# Patient Record
Sex: Female | Born: 1985 | Hispanic: No | Marital: Married | Smoking: Never smoker
Health system: Southern US, Community
[De-identification: ages and names within clinical notes are randomized; demographics above are authoritative.]

## PROBLEM LIST (undated history)

## (undated) ENCOUNTER — Inpatient Hospital Stay (HOSPITAL_COMMUNITY): Payer: Self-pay

## (undated) DIAGNOSIS — N390 Urinary tract infection, site not specified: Secondary | ICD-10-CM

## (undated) HISTORY — DX: Urinary tract infection, site not specified: N39.0

## (undated) HISTORY — PX: NO PAST SURGERIES: SHX2092

---

## 2011-01-24 ENCOUNTER — Inpatient Hospital Stay (HOSPITAL_COMMUNITY): Payer: Medicaid Other

## 2011-01-24 ENCOUNTER — Inpatient Hospital Stay (HOSPITAL_COMMUNITY)
Admission: AD | Admit: 2011-01-24 | Discharge: 2011-01-24 | Disposition: A | Discharge status: Home / Self Care | Payer: Medicaid Other | Source: Ambulatory Visit | Attending: Obstetrics & Gynecology | Admitting: Obstetrics & Gynecology

## 2011-01-24 DIAGNOSIS — O99891 Other specified diseases and conditions complicating pregnancy: Secondary | ICD-10-CM | POA: Insufficient documentation

## 2011-01-24 DIAGNOSIS — O9989 Other specified diseases and conditions complicating pregnancy, childbirth and the puerperium: Secondary | ICD-10-CM

## 2011-01-24 LAB — WET PREP, GENITAL: Clue Cells Wet Prep HPF POC: NONE SEEN

## 2011-01-27 LAB — GC/CHLAMYDIA PROBE AMP, GENITAL: Chlamydia, DNA Probe: NEGATIVE

## 2011-02-11 ENCOUNTER — Other Ambulatory Visit: Payer: Self-pay | Admitting: Obstetrics and Gynecology

## 2011-02-11 ENCOUNTER — Other Ambulatory Visit: Payer: Self-pay | Admitting: Family Medicine

## 2011-02-11 DIAGNOSIS — O0930 Supervision of pregnancy with insufficient antenatal care, unspecified trimester: Secondary | ICD-10-CM

## 2011-02-11 LAB — POCT URINALYSIS DIP (DEVICE)
Glucose, UA: NEGATIVE mg/dL
Specific Gravity, Urine: 1.02 (ref 1.005–1.030)
Urobilinogen, UA: 1 mg/dL (ref 0.0–1.0)

## 2011-02-25 ENCOUNTER — Other Ambulatory Visit: Payer: Self-pay | Admitting: Obstetrics and Gynecology

## 2011-02-25 DIAGNOSIS — O0930 Supervision of pregnancy with insufficient antenatal care, unspecified trimester: Secondary | ICD-10-CM

## 2011-02-25 LAB — POCT URINALYSIS DIP (DEVICE)
Bilirubin Urine: NEGATIVE
Glucose, UA: NEGATIVE mg/dL
Hgb urine dipstick: NEGATIVE
Nitrite: NEGATIVE

## 2011-03-04 ENCOUNTER — Other Ambulatory Visit: Payer: Self-pay | Admitting: Family Medicine

## 2011-03-04 DIAGNOSIS — O0930 Supervision of pregnancy with insufficient antenatal care, unspecified trimester: Secondary | ICD-10-CM

## 2011-03-04 DIAGNOSIS — O368190 Decreased fetal movements, unspecified trimester, not applicable or unspecified: Secondary | ICD-10-CM

## 2011-03-04 LAB — POCT URINALYSIS DIP (DEVICE)
Ketones, ur: NEGATIVE mg/dL
Leukocytes, UA: NEGATIVE

## 2011-03-11 ENCOUNTER — Other Ambulatory Visit: Payer: Self-pay | Admitting: Obstetrics and Gynecology

## 2011-03-11 DIAGNOSIS — O0930 Supervision of pregnancy with insufficient antenatal care, unspecified trimester: Secondary | ICD-10-CM

## 2011-03-11 DIAGNOSIS — O480 Post-term pregnancy: Secondary | ICD-10-CM

## 2011-03-11 LAB — POCT URINALYSIS DIP (DEVICE)
Bilirubin Urine: NEGATIVE
Glucose, UA: NEGATIVE mg/dL
Ketones, ur: NEGATIVE mg/dL
Specific Gravity, Urine: 1.02 (ref 1.005–1.030)

## 2011-03-13 ENCOUNTER — Other Ambulatory Visit: Payer: Medicaid Other

## 2011-03-13 ENCOUNTER — Ambulatory Visit (HOSPITAL_COMMUNITY)
Admission: RE | Admit: 2011-03-13 | Discharge: 2011-03-13 | Disposition: A | Discharge status: Home / Self Care | Payer: Medicaid Other | Source: Ambulatory Visit | Attending: Obstetrics and Gynecology | Admitting: Obstetrics and Gynecology

## 2011-03-13 DIAGNOSIS — O480 Post-term pregnancy: Secondary | ICD-10-CM | POA: Insufficient documentation

## 2011-03-13 DIAGNOSIS — Z3689 Encounter for other specified antenatal screening: Secondary | ICD-10-CM | POA: Insufficient documentation

## 2011-03-13 DIAGNOSIS — O0930 Supervision of pregnancy with insufficient antenatal care, unspecified trimester: Secondary | ICD-10-CM

## 2011-03-18 ENCOUNTER — Other Ambulatory Visit: Payer: Self-pay | Admitting: Obstetrics and Gynecology

## 2011-03-18 ENCOUNTER — Other Ambulatory Visit: Payer: Medicaid Other

## 2011-03-18 DIAGNOSIS — O480 Post-term pregnancy: Secondary | ICD-10-CM

## 2011-03-18 LAB — POCT URINALYSIS DIP (DEVICE)
Glucose, UA: NEGATIVE mg/dL
Specific Gravity, Urine: 1.025 (ref 1.005–1.030)

## 2011-03-20 ENCOUNTER — Inpatient Hospital Stay (HOSPITAL_COMMUNITY)
Admission: RE | Admit: 2011-03-20 | Discharge: 2011-03-23 | DRG: 775 | Disposition: A | Discharge status: Home / Self Care | Payer: Medicaid Other | Source: Ambulatory Visit | Attending: Obstetrics & Gynecology | Admitting: Obstetrics & Gynecology

## 2011-03-20 DIAGNOSIS — O480 Post-term pregnancy: Principal | ICD-10-CM | POA: Diagnosis present

## 2011-03-20 DIAGNOSIS — O99892 Other specified diseases and conditions complicating childbirth: Secondary | ICD-10-CM | POA: Diagnosis present

## 2011-03-20 DIAGNOSIS — N90810 Female genital mutilation status, unspecified: Secondary | ICD-10-CM | POA: Diagnosis present

## 2011-03-20 LAB — RPR: RPR Ser Ql: NONREACTIVE

## 2011-03-20 LAB — CBC
Hemoglobin: 12.2 g/dL (ref 12.0–15.0)
MCH: 29 pg (ref 26.0–34.0)
MCV: 87.4 fL (ref 78.0–100.0)
RBC: 4.21 MIL/uL (ref 3.87–5.11)

## 2011-03-21 DIAGNOSIS — O480 Post-term pregnancy: Secondary | ICD-10-CM

## 2011-03-21 DIAGNOSIS — O9989 Other specified diseases and conditions complicating pregnancy, childbirth and the puerperium: Secondary | ICD-10-CM

## 2011-03-22 LAB — CBC
HCT: 33.9 % — ABNORMAL LOW (ref 36.0–46.0)
Hemoglobin: 10.9 g/dL — ABNORMAL LOW (ref 12.0–15.0)
MCH: 28.2 pg (ref 26.0–34.0)
MCHC: 32.2 g/dL (ref 30.0–36.0)
RBC: 3.87 MIL/uL (ref 3.87–5.11)

## 2011-04-20 ENCOUNTER — Ambulatory Visit: Payer: Medicaid Other | Admitting: Family Medicine

## 2012-10-31 IMAGING — US US OB COMP +14 WK
1 series · 12 of 28 positions shown · non-contrast
Comparison: none

[Series 1: us ob comp +14 wk · 12 of 91 slices shown]
[im 4/91]
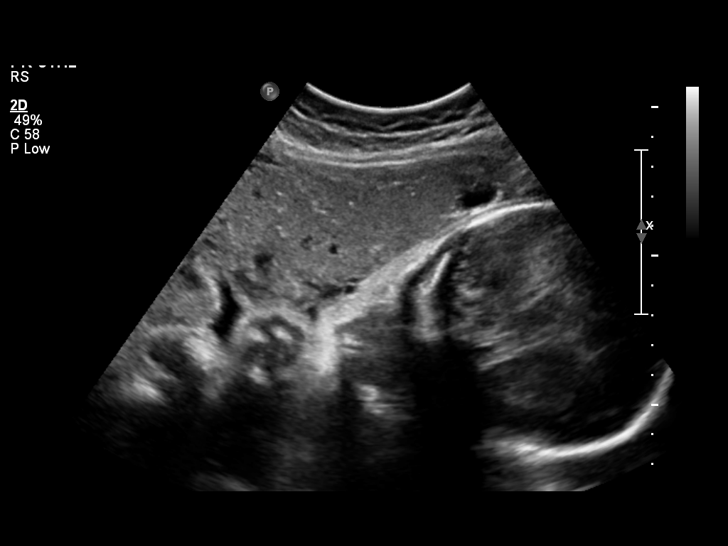
[im 11/91]
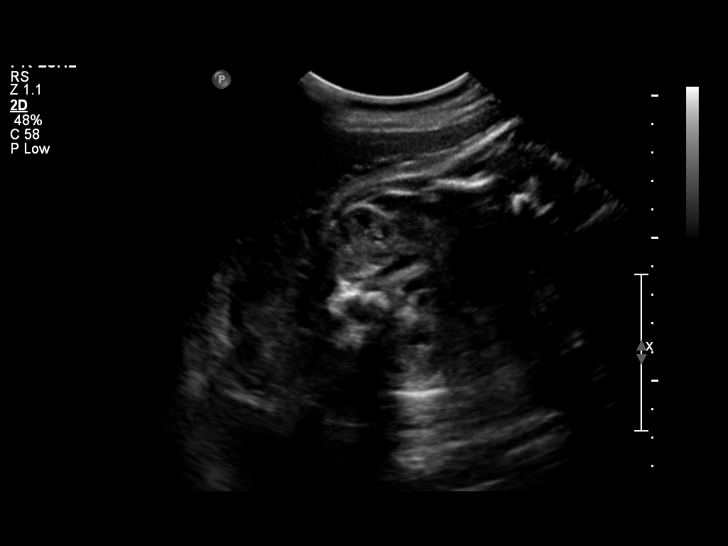
[im 17/91]
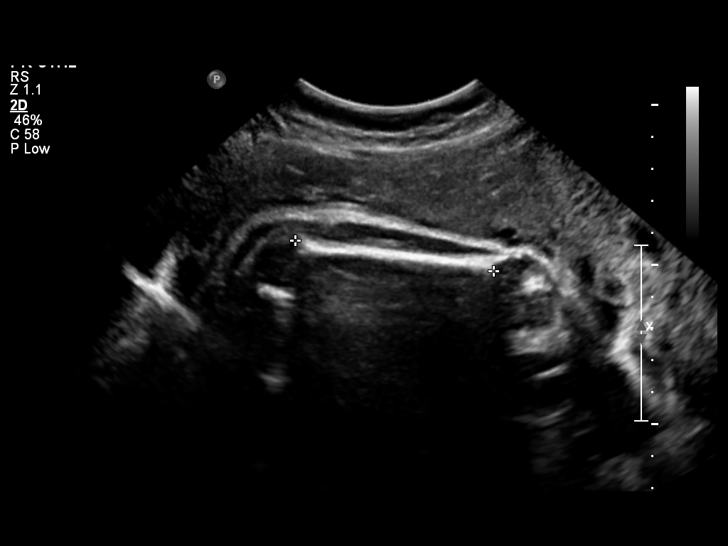
[im 27/91]
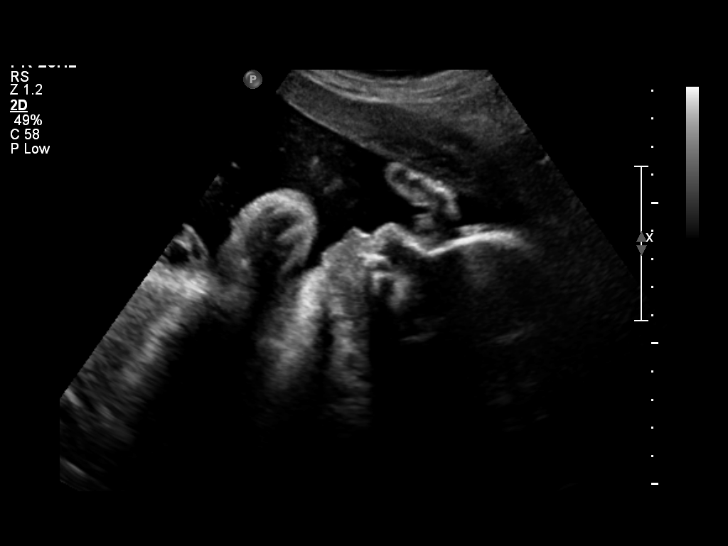
[im 34/91]
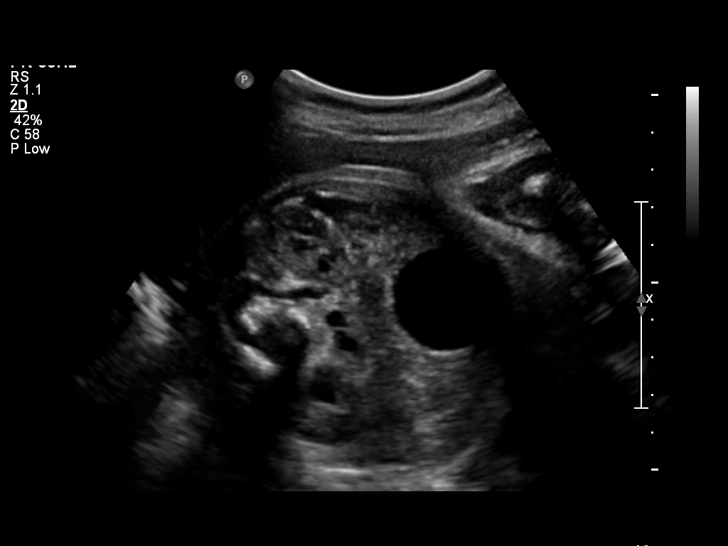
[im 41/91]
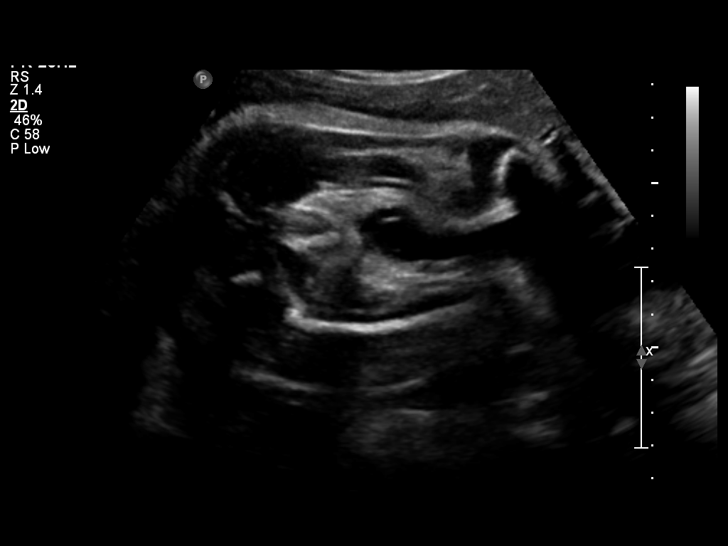
[im 51/91]
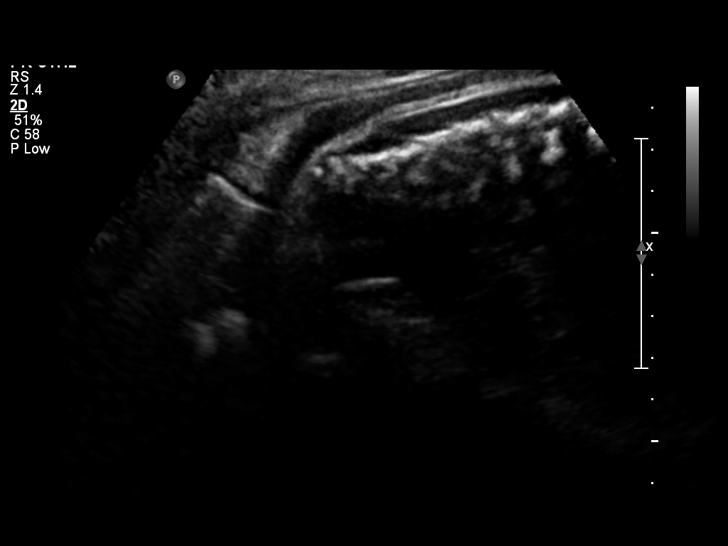
[im 57/91]
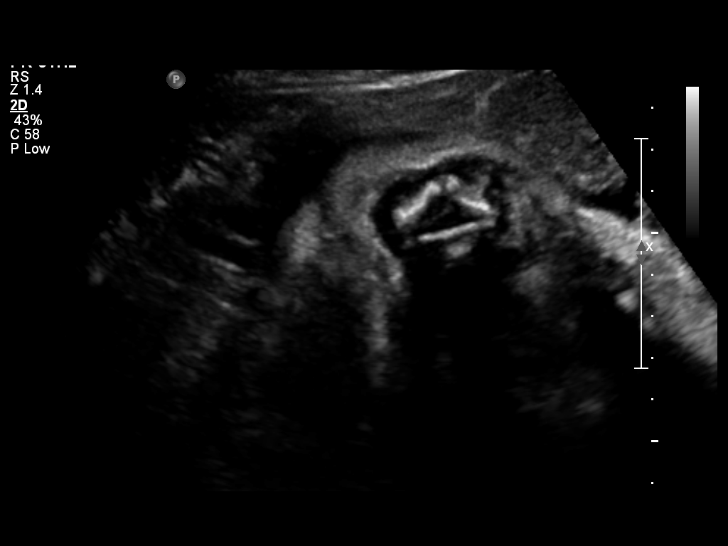
[im 64/91]
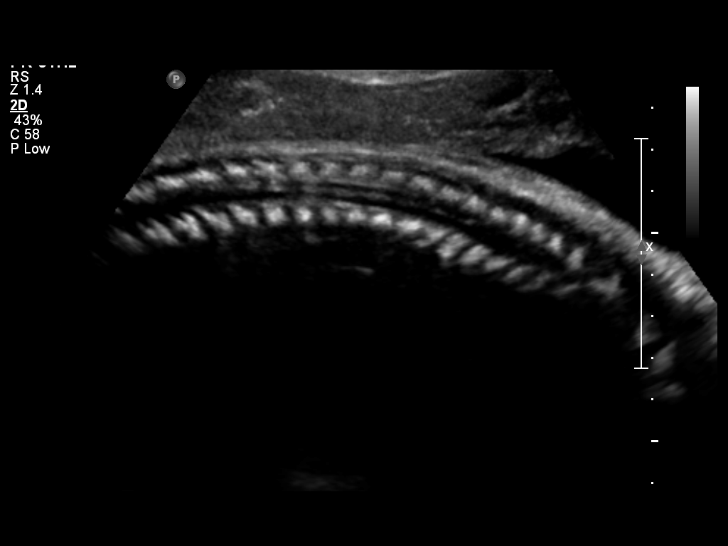
[im 74/91]
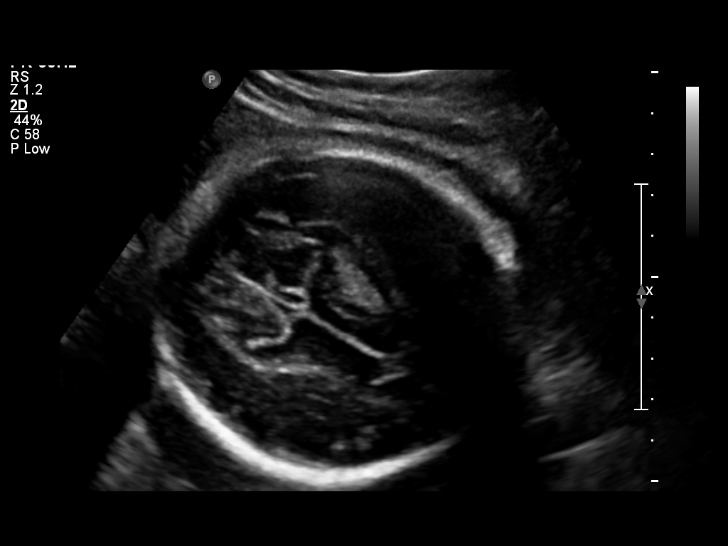
[im 81/91]
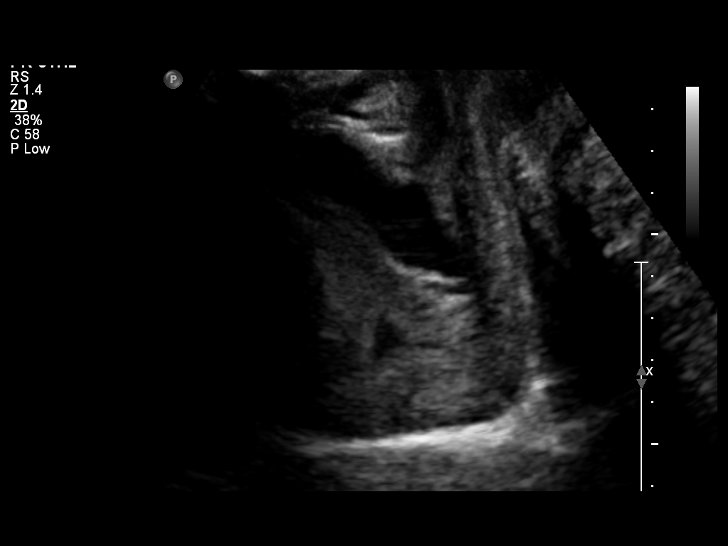
[im 87/91]
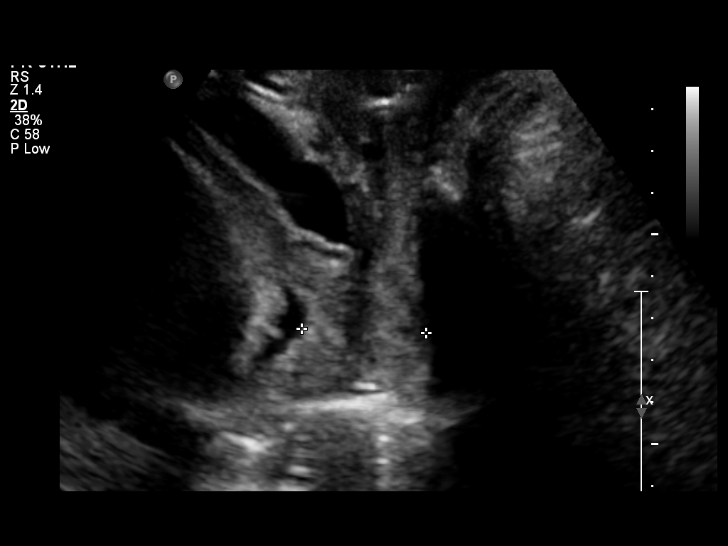

[12 of 28 positions shown; findings below may reference images not displayed]

OBSTETRICS REPORT
                      (Signed Final 01/24/2011 [DATE])

Procedures

 US OB COMP + 14 WK                                    76805.1
Indications

 Spontaneous  Rupture of Membranes - leaking fluid
 Pain - Abdominal/Pelvic
 No or Little Prenatal Care
Fetal Evaluation

 Fetal Heart Rate:  140                          bpm
 Cardiac Activity:  Observed
 Presentation:      Cephalic
 Placenta:          Anterior, above cervical os

 Amniotic Fluid
 AFI FV:      Subjectively within normal limits
 AFI Sum:     18.02   cm       66  %Tile     Larg Pckt:    6.25  cm
 RUQ:   6.25    cm   RLQ:    4.6    cm    LUQ:   3.49    cm   LLQ:    3.68   cm
Biometry

 BPD:     82.6  mm     G. Age:  33w 2d                CI:        73.68   70 - 86
                                                      FL/HC:      20.3   19.9 -

 HC:     305.7  mm     G. Age:  34w 0d       32  %    HC/AC:      1.04   0.96 -

 AC:     294.5  mm     G. Age:  33w 3d       56  %    FL/BPD:     75.3   71 - 87
 FL:      62.2  mm     G. Age:  32w 1d       16  %    FL/AC:      21.1   20 - 24

 Est. FW:    7879  gm    4 lb 11 oz      55  %
Gestational Age

 Clinical EDD:  30w 1d                                        EDD:   04/03/11
 U/S Today:     33w 2d                                        EDD:   03/12/11
 Best:          33w 2d     Det. By:  U/S (01/24/11)           EDD:   03/12/11
Anatomy

 Cranium:           Appears normal      Aortic Arch:       Basic anatomy
                                                           exam per order
 Fetal Cavum:       Appears normal      Ductal Arch:       Basic anatomy
                                                           exam per order
 Ventricles:        Appears normal      Diaphragm:         Appears normal
 Choroid Plexus:    Appears normal      Stomach:           Appears
                                                           normal, left
                                                           sided
 Cerebellum:        Appears normal      Abdomen:           Appears normal
 Posterior Fossa:   Appears normal      Abdominal Wall:    Not well
                                                           visualized
 Nuchal Fold:       Not applicable      Cord Vessels:      Appears normal
                    (>20 wks GA)                           (3 vessel cord)
 Face:              Lips and profile    Kidneys:           Appear normal
                    appear normal
 Heart:             Appears normal      Bladder:           Appears normal
                    (4 chamber &
                    axis)
 RVOT:              Basic anatomy       Spine:             Appears normal
                    exam per order
 LVOT:              Appears normal      Limbs:             Four extremities
                                                           visualized
                                                           (basic anatomy
                                                           exam)

 Other:     Male gender. Nasal bone visualized. Technically difficult
            due to advanced GA and fetal position.
Cervix Uterus Adnexa

 Cervical Length:    3        cm

 Cervix:       Normal appearance by translabial scan.

 Adnexa:     No abnormality visualized.
Impression

 Single living IUP with US Gest. Age of 33w 2d, and EDD of
 03/12/2011.  This is 3 wks ahead of given clinical EDD;
 recommend clinical correlation with prior US if performed.
 Suboptimal visualization of fetal anatomy due to advanced
 GA, however no fetal anomalies identified.
 Normal amniotic fluid volume. Normal cervical length.

 questions or concerns.

## 2013-09-21 NOTE — L&D Delivery Note (Signed)
Delivery Note At 1:44 PM a viable female was delivered via Vaginal, Spontaneous Delivery (Presentation: OA ), ritkins performed and tight shoulders noted for <20sec, nuchal cord APGAR: 6, 9; weight .   Placenta status: Intact, Spontaneous.  Cord: 3 vessels with the following complications: None.  Anesthesia: Epidural  Episiotomy: None Lacerations: 1st degree Suture Repair: 3.0 vicryl rapide Est. Blood Loss (mL): 300  Mom to postpartum.  Baby to Couplet care / Skin to Skin.  Baby initially to warmer for suctioning.  Kristen Watkins ROCIO 05/01/2014, 2:08 PM

## 2014-01-08 LAB — CHG URINALYSIS, AUTO, W/O SCOPE: PREG TEST UR: POSITIVE

## 2014-01-27 ENCOUNTER — Inpatient Hospital Stay (HOSPITAL_COMMUNITY)
Admission: AD | Admit: 2014-01-27 | Discharge: 2014-01-27 | Disposition: A | Discharge status: Home / Self Care | Payer: Medicaid Other | Source: Ambulatory Visit | Attending: Family Medicine | Admitting: Family Medicine

## 2014-01-27 ENCOUNTER — Encounter (HOSPITAL_COMMUNITY): Payer: Self-pay | Admitting: *Deleted

## 2014-01-27 DIAGNOSIS — O368190 Decreased fetal movements, unspecified trimester, not applicable or unspecified: Secondary | ICD-10-CM | POA: Insufficient documentation

## 2014-01-27 DIAGNOSIS — O2650 Maternal hypotension syndrome, unspecified trimester: Secondary | ICD-10-CM | POA: Insufficient documentation

## 2014-01-27 DIAGNOSIS — R55 Syncope and collapse: Secondary | ICD-10-CM

## 2014-01-27 LAB — URINE MICROSCOPIC-ADD ON

## 2014-01-27 LAB — URINALYSIS, ROUTINE W REFLEX MICROSCOPIC
BILIRUBIN URINE: NEGATIVE
Glucose, UA: NEGATIVE mg/dL
KETONES UR: 15 mg/dL — AB
LEUKOCYTES UA: NEGATIVE
NITRITE: NEGATIVE
Protein, ur: NEGATIVE mg/dL
UROBILINOGEN UA: 0.2 mg/dL (ref 0.0–1.0)
pH: 6 (ref 5.0–8.0)

## 2014-01-27 NOTE — Discharge Instructions (Signed)
Vasovagal Syncope, Adult  Syncope, commonly known as fainting, is a temporary loss of consciousness. It occurs when the blood flow to the brain is reduced. Vasovagal syncope (also called neurocardiogenic syncope) is a fainting spell in which the blood flow to the brain is reduced because of a sudden drop in heart rate and blood pressure. Vasovagal syncope occurs when the brain and the cardiovascular system (blood vessels) do not adequately communicate and respond to each other. This is the most common cause of fainting. It often occurs in response to fear or some other type of emotional or physical stress. The body has a reaction in which the heart starts beating too slowly or the blood vessels expand, reducing blood pressure. This type of fainting spell is generally considered harmless. However, injuries can occur if a person takes a sudden fall during a fainting spell.   CAUSES   Vasovagal syncope occurs when a person's blood pressure and heart rate decrease suddenly, usually in response to a trigger. Many things and situations can trigger an episode. Some of these include:   · Pain.    · Fear.    · The sight of blood or medical procedures, such as blood being drawn from a vein.    · Common activities, such as coughing, swallowing, stretching, or going to the bathroom.    · Emotional stress.    · Prolonged standing, especially in a warm environment.    · Lack of sleep or rest.    · Prolonged lack of food.    · Prolonged lack of fluids.    · Recent illness.  · The use of certain drugs that affect blood pressure, such as cocaine, alcohol, marijuana, inhalants, and opiates.    SYMPTOMS   Before the fainting episode, you may:   · Feel dizzy or light headed.    · Become pale.  · Sense that you are going to faint.    · Feel like the room is spinning.    · Have tunnel vision, only seeing directly in front of you.    · Feel sick to your stomach (nauseous).    · See spots or slowly lose vision.    · Hear ringing in your  ears.    · Have a headache.    · Feel warm and sweaty.    · Feel a sensation of pins and needles.  During the fainting spell, you will generally be unconscious for no longer than a couple minutes before waking up and returning to normal. If you get up too quickly before your body can recover, you may faint again. Some twitching or jerky movements may occur during the fainting spell.   DIAGNOSIS   Your caregiver will ask about your symptoms, take a medical history, and perform a physical exam. Various tests may be done to rule out other causes of fainting. These may include blood tests and tests to check the heart, such as electrocardiography, echocardiography, and possibly an electrophysiology study. When other causes have been ruled out, a test may be done to check the body's response to changes in position (tilt table test).  TREATMENT   Most cases of vasovagal syncope do not require treatment. Your caregiver may recommend ways to avoid fainting triggers and may provide home strategies for preventing fainting. If you must be exposed to a possible trigger, you can drink additional fluids to help reduce your chances of having an episode of vasovagal syncope. If you have warning signs of an oncoming episode, you can respond by positioning yourself favorably (lying down).  If your fainting spells continue, you may be   given medicines to prevent fainting. Some medicines may help make you more resistant to repeated episodes of vasovagal syncope. Special exercises or compression stockings may be recommended. In rare cases, the surgical placement of a pacemaker is considered.  HOME CARE INSTRUCTIONS   · Learn to identify the warning signs of vasovagal syncope.    · Sit or lie down at the first warning sign of a fainting spell. If sitting, put your head down between your legs. If you lie down, swing your legs up in the air to increase blood flow to the brain.    · Avoid hot tubs and saunas.  · Avoid prolonged  standing.  · Drink enough fluids to keep your urine clear or pale yellow. Avoid caffeine.  · Increase salt in your diet as directed by your caregiver.    · If you have to stand for a long time, perform movements such as:    · Crossing your legs.    · Flexing and stretching your leg muscles.    · Squatting.    · Moving your legs.    · Bending over.    · Only take over-the-counter or prescription medicines as directed by your caregiver. Do not suddenly stop any medicines without asking your caregiver first.   SEEK MEDICAL CARE IF:   · Your fainting spells continue or happen more frequently in spite of treatment.    · You lose consciousness for more than a couple minutes.  · You have fainting spells during or after exercising or after being startled.    · You have new symptoms that occur with the fainting spells, such as:    · Shortness of breath.  · Chest pain.    · Irregular heartbeat.    · You have episodes of twitching or jerky movements that last longer than a few seconds.  · You have episodes of twitching or jerky movements without obvious fainting.  SEEK IMMEDIATE MEDICAL CARE IF:   · You have injuries or bleeding after a fainting spell.    · You have episodes of twitching or jerky movements that last longer than 5 minutes.    · You have more than one spell of twitching or jerky movements before returning to consciousness after fainting.  MAKE SURE YOU:   · Understand these instructions.  · Will watch your condition.  · Will get help right away if you are not doing well or get worse.  Document Released: 08/24/2012 Document Reviewed: 08/24/2012  [employer]® Patient Information ©2014 [employer].

## 2014-01-27 NOTE — MAU Note (Signed)
Have not felt baby move as much as normal today. About 2 hrs ago was sitting down and suddenly felt like i was going to throw up, couldn't hear well and like vision closing in. Feeling lasted about half an hour and then felt better. Have never felt that way before.

## 2014-01-27 NOTE — Progress Notes (Signed)
Notified of pt arrival in MAU and complaint. Will come see pt. Also told Dr. Ike Benedom about pt preference for female provider if vaginal exam needs to be done

## 2014-01-27 NOTE — MAU Provider Note (Signed)
None     Chief Complaint:  Decreased Fetal Movement   Kristen Watkins is  28 y.o. G2P1001 at 1638w6d presents complaining of near syncope episode associated with laying on the couch, felt nauseated and had tunneling of vision and decreased hearing, Felt light headed. Did not try standing up. Occurred at 1600 and lasted about 30min. Felt warm afterwards.  No strenous activity. No change in normal routine. Reports normal fluid intake. Never occurred before. Now back to normal. Pt reports a pressure in her head since onset. No palipatations  Baby had decreased movement this AM but back to normal now, no lof, no vb, no ctx.  Obstetrical/Gynecological History: OB History   Grav Para Term Preterm Abortions TAB SAB Ect Mult Living   2 1 1       1      Past Medical History: History reviewed. No pertinent past medical history.  Past Surgical History: History reviewed. No pertinent past surgical history.  Family History: History reviewed. No pertinent family history.  Social History: History  Substance Use Topics  . Smoking status: Never Smoker   . Smokeless tobacco: Not on file  . Alcohol Use: No    Allergies: No Known Allergies  Meds:  No prescriptions prior to admission    Review of Systems -  see above    Physical Exam  Blood pressure 120/58, pulse 81, temperature 98.7 F (37.1 C), resp. rate 18, height 5\' 6"  (1.676 m), weight 86.093 kg (189 lb 12.8 oz), SpO2 100.00%. GENERAL: Well-developed, well-nourished female in no acute distress.  LUNGS: Clear to auscultation bilaterally.  HEART: Regular rate and rhythm. ABDOMEN: Soft, nontender, nondistended, gravid.  EXTREMITIES: Nontender, no edema, 2+ distal pulses. NERUO: no deficits seen, CN intact, ambulating wihtout difficulty FHT:  Baseline rate 150s bpm   Variability moderate  Accelerations present   Decelerations none No ctx   Labs: Results for orders placed during the hospital encounter of 01/27/14 (from the past 24  hour(s))  URINALYSIS, ROUTINE W REFLEX MICROSCOPIC   Collection Time    01/27/14 10:25 PM      Result Value Ref Range   Color, Urine YELLOW  YELLOW   APPearance CLEAR  CLEAR   Specific Gravity, Urine >1.030 (*) 1.005 - 1.030   pH 6.0  5.0 - 8.0   Glucose, UA NEGATIVE  NEGATIVE mg/dL   Hgb urine dipstick TRACE (*) NEGATIVE   Bilirubin Urine NEGATIVE  NEGATIVE   Ketones, ur 15 (*) NEGATIVE mg/dL   Protein, ur NEGATIVE  NEGATIVE mg/dL   Urobilinogen, UA 0.2  0.0 - 1.0 mg/dL   Nitrite NEGATIVE  NEGATIVE   Leukocytes, UA NEGATIVE  NEGATIVE  URINE MICROSCOPIC-ADD ON   Collection Time    01/27/14 10:25 PM      Result Value Ref Range   Squamous Epithelial / LPF FEW (*) RARE   WBC, UA 0-2  <3 WBC/hpf   RBC / HPF 0-2  <3 RBC/hpf   Bacteria, UA RARE  RARE   Crystals CA OXALATE CRYSTALS (*) NEGATIVE   Urine-Other MUCOUS PRESENT     Imaging Studies:  No results found.  Assessment: Kristen ClassSamah Watkins is  28 y.o. G2P1001 at 7638w6d presents with symptoms c/w vasovagal near syncope without loss of consciousness. No other complaints.  - low concern for cardiac etiology given no palpitations, normal cardiac exam, no hx - No evidence of infection - Low concern for neurologic component - currently stable. Return for symptoms recurrence, or other symptoms.  Pt is  establishing care with our practice in June. Will order outpatient US to be done prior to appt.   Minta BalsamMichael R Kilan Banfill 5/9/201511:00 PM

## 2014-01-28 NOTE — MAU Provider Note (Signed)
Attestation of Attending Supervision of Advanced Practitioner (PA/CNM/NP): Evaluation and management procedures were performed by the Advanced Practitioner under my supervision and collaboration.  I have reviewed the Advanced Practitioner's note and chart, and I agree with the management and plan.  Braylie Badami S Cordaro Mukai, MD [facility] Attending 01/28/2014 7:42 AM   

## 2014-01-31 ENCOUNTER — Encounter: Payer: Self-pay | Admitting: *Deleted

## 2014-02-02 ENCOUNTER — Ambulatory Visit (HOSPITAL_COMMUNITY)
Admission: RE | Admit: 2014-02-02 | Discharge: 2014-02-02 | Disposition: A | Discharge status: Home / Self Care | Payer: Medicaid Other | Source: Ambulatory Visit | Attending: Family Medicine | Admitting: Family Medicine

## 2014-02-02 DIAGNOSIS — O2650 Maternal hypotension syndrome, unspecified trimester: Secondary | ICD-10-CM | POA: Insufficient documentation

## 2014-02-02 DIAGNOSIS — R55 Syncope and collapse: Secondary | ICD-10-CM

## 2014-02-21 ENCOUNTER — Other Ambulatory Visit (HOSPITAL_COMMUNITY)
Admission: RE | Admit: 2014-02-21 | Discharge: 2014-02-21 | Disposition: A | Discharge status: Home / Self Care | Payer: Medicaid Other | Source: Ambulatory Visit | Attending: Advanced Practice Midwife | Admitting: Advanced Practice Midwife

## 2014-02-21 ENCOUNTER — Ambulatory Visit (INDEPENDENT_AMBULATORY_CARE_PROVIDER_SITE_OTHER): Payer: Medicaid Other | Admitting: Advanced Practice Midwife

## 2014-02-21 ENCOUNTER — Encounter: Payer: Self-pay | Admitting: Advanced Practice Midwife

## 2014-02-21 VITALS — BP 110/65 | HR 76 | Temp 98.1°F | Wt 191.3 lb

## 2014-02-21 DIAGNOSIS — O0930 Supervision of pregnancy with insufficient antenatal care, unspecified trimester: Secondary | ICD-10-CM

## 2014-02-21 DIAGNOSIS — Z01419 Encounter for gynecological examination (general) (routine) without abnormal findings: Secondary | ICD-10-CM | POA: Insufficient documentation

## 2014-02-21 DIAGNOSIS — Z23 Encounter for immunization: Secondary | ICD-10-CM

## 2014-02-21 DIAGNOSIS — O0933 Supervision of pregnancy with insufficient antenatal care, third trimester: Secondary | ICD-10-CM | POA: Insufficient documentation

## 2014-02-21 DIAGNOSIS — Z113 Encounter for screening for infections with a predominantly sexual mode of transmission: Secondary | ICD-10-CM | POA: Insufficient documentation

## 2014-02-21 LAB — POCT URINALYSIS DIP (DEVICE)
Bilirubin Urine: NEGATIVE
GLUCOSE, UA: NEGATIVE mg/dL
Hgb urine dipstick: NEGATIVE
KETONES UR: NEGATIVE mg/dL
LEUKOCYTES UA: NEGATIVE
Nitrite: NEGATIVE
PROTEIN: NEGATIVE mg/dL
SPECIFIC GRAVITY, URINE: 1.015 (ref 1.005–1.030)
UROBILINOGEN UA: 0.2 mg/dL (ref 0.0–1.0)
pH: 7 (ref 5.0–8.0)

## 2014-02-21 MED ORDER — TETANUS-DIPHTH-ACELL PERTUSSIS 5-2.5-18.5 LF-MCG/0.5 IM SUSP: 0.5000 mL | Freq: Once | INTRAMUSCULAR | Status: DC

## 2014-02-21 NOTE — Progress Notes (Signed)
New OB. See smartset   Subjective:    Kristen Watkins is a G2P1001 [redacted]w[redacted]d being seen today for her first obstetrical visit.  Her obstetrical history is significant for Late to care. Patient does intend to breast feed. Pregnancy history fully reviewed.  Patient reports no complaints.  Filed Vitals:   02/21/14 0908  BP: 110/65  Pulse: 76  Temp: 98.1 F (36.7 C)  Weight: 86.773 kg (191 lb 4.8 oz)    HISTORY: OB History  Gravida Para Term Preterm AB SAB TAB Ectopic Multiple Living  2 1 1       1     # Outcome Date GA Lbr Len/2nd Weight Sex Delivery Anes PTL Lv  2 CUR           1 TRM 03/21/11    M SVD EPI  Y     History reviewed. No pertinent past medical history. History reviewed. No pertinent past surgical history. History reviewed. No pertinent family history.   Exam    Uterus:  Fundal Height: 30 cm  Pelvic Exam:    Perineum: No Hemorrhoids, Normal Perineum   Vulva: Bartholin's, Urethra, Skene's normal   Vagina:  normal mucosa, normal discharge   pH:    Cervix: multiparous appearance   Adnexa: normal adnexa and no mass, fullness, tenderness   Bony Pelvis: gynecoid  System: Breast:  normal appearance, no masses or tenderness   Skin: normal coloration and turgor, no rashes    Neurologic: oriented, grossly non-focal   Extremities: normal strength, tone, and muscle mass   HEENT neck supple with midline trachea   Mouth/Teeth mucous membranes moist, pharynx normal without lesions   Neck supple and no masses   Cardiovascular: regular rate and rhythm   Respiratory:  appears well, vitals normal, no respiratory distress, acyanotic, normal RR, ear and throat exam is normal   Abdomen: soft, non-tender; bowel sounds normal; no masses,  no organomegaly   Urinary: urethral meatus normal      Assessment:    Pregnancy: G2P1001 Patient Active Problem List   Diagnosis Date Noted  . Late prenatal care complicating pregnancy in third trimester 02/21/2014        Plan:      Initial labs drawn. Prenatal vitamins. Problem list reviewed and updated. Genetic Screening discussed Quad Screen: Too Late.  Ultrasound discussed; fetal survey: results reviewed.  Follow up in 2 weeks. 50% of 30 min visit spent on counseling and coordination of care.   Will need f/u US in early to mid July   Aviva Signs 02/21/2014

## 2014-02-21 NOTE — Progress Notes (Signed)
Initial labs, Education booklets given.

## 2014-02-21 NOTE — Patient Instructions (Signed)
Third Trimester of Pregnancy  The third trimester is from week 29 through week 42, months 7 through 9. The third trimester is a time when the fetus is growing rapidly. At the end of the ninth month, the fetus is about 20 inches in length and weighs 6 10 pounds.   BODY CHANGES  Your body goes through many changes during pregnancy. The changes vary from woman to woman.    Your weight will continue to increase. You can expect to gain 25 35 pounds (11 16 kg) by the end of the pregnancy.   You may begin to get stretch marks on your hips, abdomen, and breasts.   You may urinate more often because the fetus is moving lower into your pelvis and pressing on your bladder.   You may develop or continue to have heartburn as a result of your pregnancy.   You may develop constipation because certain hormones are causing the muscles that push waste through your intestines to slow down.   You may develop hemorrhoids or swollen, bulging veins (varicose veins).   You may have pelvic pain because of the weight gain and pregnancy hormones relaxing your joints between the bones in your pelvis. Back aches may result from over exertion of the muscles supporting your posture.   Your breasts will continue to grow and be tender. A yellow discharge may leak from your breasts called colostrum.   Your belly button may stick out.   You may feel short of breath because of your expanding uterus.   You may notice the fetus "dropping," or moving lower in your abdomen.   You may have a bloody mucus discharge. This usually occurs a few days to a week before labor begins.   Your cervix becomes thin and soft (effaced) near your due date.  WHAT TO EXPECT AT YOUR PRENATAL EXAMS   You will have prenatal exams every 2 weeks until week 36. Then, you will have weekly prenatal exams. During a routine prenatal visit:   You will be weighed to make sure you and the fetus are growing normally.   Your blood pressure is taken.   Your abdomen will be  measured to track your baby's growth.   The fetal heartbeat will be listened to.   Any test results from the previous visit will be discussed.   You may have a cervical check near your due date to see if you have effaced.  At around 36 weeks, your caregiver will check your cervix. At the same time, your caregiver will also perform a test on the secretions of the vaginal tissue. This test is to determine if a type of bacteria, Group B streptococcus, is present. Your caregiver will explain this further.  Your caregiver may ask you:   What your birth plan is.   How you are feeling.   If you are feeling the baby move.   If you have had any abnormal symptoms, such as leaking fluid, bleeding, severe headaches, or abdominal cramping.   If you have any questions.  Other tests or screenings that may be performed during your third trimester include:   Blood tests that check for low iron levels (anemia).   Fetal testing to check the health, activity level, and growth of the fetus. Testing is done if you have certain medical conditions or if there are problems during the pregnancy.  FALSE LABOR  You may feel small, irregular contractions that eventually go away. These are called Braxton Hicks contractions, or   false labor. Contractions may last for hours, days, or even weeks before true labor sets in. If contractions come at regular intervals, intensify, or become painful, it is best to be seen by your caregiver.   SIGNS OF LABOR    Menstrual-like cramps.   Contractions that are 5 minutes apart or less.   Contractions that start on the top of the uterus and spread down to the lower abdomen and back.   A sense of increased pelvic pressure or back pain.   A watery or bloody mucus discharge that comes from the vagina.  If you have any of these signs before the 37th week of pregnancy, call your caregiver right away. You need to go to the hospital to get checked immediately.  HOME CARE INSTRUCTIONS    Avoid all  smoking, herbs, alcohol, and unprescribed drugs. These chemicals affect the formation and growth of the baby.   Follow your caregiver's instructions regarding medicine use. There are medicines that are either safe or unsafe to take during pregnancy.   Exercise only as directed by your caregiver. Experiencing uterine cramps is a good sign to stop exercising.   Continue to eat regular, healthy meals.   Wear a good support bra for breast tenderness.   Do not use hot tubs, steam rooms, or saunas.   Wear your seat belt at all times when driving.   Avoid raw meat, uncooked cheese, cat litter boxes, and soil used by cats. These carry germs that can cause birth defects in the baby.   Take your prenatal vitamins.   Try taking a stool softener (if your caregiver approves) if you develop constipation. Eat more high-fiber foods, such as fresh vegetables or fruit and whole grains. Drink plenty of fluids to keep your urine clear or pale yellow.   Take warm sitz baths to soothe any pain or discomfort caused by hemorrhoids. Use hemorrhoid cream if your caregiver approves.   If you develop varicose veins, wear support hose. Elevate your feet for 15 minutes, 3 4 times a day. Limit salt in your diet.   Avoid heavy lifting, wear low heal shoes, and practice good posture.   Rest a lot with your legs elevated if you have leg cramps or low back pain.   Visit your dentist if you have not gone during your pregnancy. Use a soft toothbrush to brush your teeth and be gentle when you floss.   A sexual relationship may be continued unless your caregiver directs you otherwise.   Do not travel far distances unless it is absolutely necessary and only with the approval of your caregiver.   Take prenatal classes to understand, practice, and ask questions about the labor and delivery.   Make a trial run to the hospital.   Pack your hospital bag.   Prepare the baby's nursery.   Continue to go to all your prenatal visits as directed  by your caregiver.  SEEK MEDICAL CARE IF:   You are unsure if you are in labor or if your water has broken.   You have dizziness.   You have mild pelvic cramps, pelvic pressure, or nagging pain in your abdominal area.   You have persistent nausea, vomiting, or diarrhea.   You have a bad smelling vaginal discharge.   You have pain with urination.  SEEK IMMEDIATE MEDICAL CARE IF:    You have a fever.   You are leaking fluid from your vagina.   You have spotting or bleeding from your vagina.     You have severe abdominal cramping or pain.   You have rapid weight loss or gain.   You have shortness of breath with chest pain.   You notice sudden or extreme swelling of your face, hands, ankles, feet, or legs.   You have not felt your baby move in over an hour.   You have severe headaches that do not go away with medicine.   You have vision changes.  Document Released: 09/01/2001 Document Revised: 05/10/2013 Document Reviewed: 11/08/2012  [employer] Patient Information 2014 [employer].

## 2014-02-22 LAB — OBSTETRIC PANEL
ANTIBODY SCREEN: NEGATIVE
BASOS ABS: 0 10*3/uL (ref 0.0–0.1)
BASOS PCT: 0 % (ref 0–1)
EOS ABS: 0.1 10*3/uL (ref 0.0–0.7)
EOS PCT: 2 % (ref 0–5)
HEMATOCRIT: 33.8 % — AB (ref 36.0–46.0)
HEMOGLOBIN: 11.4 g/dL — AB (ref 12.0–15.0)
Hepatitis B Surface Ag: NEGATIVE
LYMPHS PCT: 34 % (ref 12–46)
Lymphs Abs: 2.5 10*3/uL (ref 0.7–4.0)
MCH: 28.1 pg (ref 26.0–34.0)
MCHC: 33.7 g/dL (ref 30.0–36.0)
MCV: 83.5 fL (ref 78.0–100.0)
MONO ABS: 0.5 10*3/uL (ref 0.1–1.0)
Monocytes Relative: 7 % (ref 3–12)
Neutro Abs: 4.2 10*3/uL (ref 1.7–7.7)
Neutrophils Relative %: 57 % (ref 43–77)
PLATELETS: 317 10*3/uL (ref 150–400)
RBC: 4.05 MIL/uL (ref 3.87–5.11)
RDW: 14.5 % (ref 11.5–15.5)
Rh Type: POSITIVE
Rubella: 7.7 Index — ABNORMAL HIGH (ref <0.90)
WBC: 7.3 10*3/uL (ref 4.0–10.5)

## 2014-02-22 LAB — HIV ANTIBODY (ROUTINE TESTING W REFLEX): HIV: NONREACTIVE

## 2014-02-22 LAB — GLUCOSE TOLERANCE, 1 HOUR (50G) W/O FASTING: GLUCOSE 1 HOUR GTT: 120 mg/dL (ref 70–140)

## 2014-02-23 LAB — PRESCRIPTION MONITORING PROFILE (19 PANEL)
AMPHETAMINE/METH: NEGATIVE ng/mL
BARBITURATE SCREEN, URINE: NEGATIVE ng/mL
Benzodiazepine Screen, Urine: NEGATIVE ng/mL
Buprenorphine, Urine: NEGATIVE ng/mL
CANNABINOID SCRN UR: NEGATIVE ng/mL
CARISOPRODOL, URINE: NEGATIVE ng/mL
COCAINE METABOLITES: NEGATIVE ng/mL
CREATININE, URINE: 76.41 mg/dL (ref >20.0)
FENTANYL URINE: NEGATIVE ng/mL
MDMA URINE: NEGATIVE ng/mL
METHADONE SCREEN, URINE: NEGATIVE ng/mL
Meperidine, Ur: NEGATIVE ng/mL
Methaqualone: NEGATIVE ng/mL
Nitrites, Initial: NEGATIVE ug/mL
OPIATE SCREEN, URINE: NEGATIVE ng/mL
OXYCODONE SCRN UR: NEGATIVE ng/mL
PH URINE, INITIAL: 7.7 pH (ref 4.5–8.9)
PHENCYCLIDINE, UR: NEGATIVE ng/mL
Propoxyphene: NEGATIVE ng/mL
TRAMADOL UR: NEGATIVE ng/mL
Tapentadol, urine: NEGATIVE ng/mL
Zolpidem, Urine: NEGATIVE ng/mL

## 2014-02-23 LAB — HEMOGLOBINOPATHY EVALUATION
Hemoglobin Other: 0 %
Hgb A2 Quant: 2.8 % (ref 2.2–3.2)
Hgb A: 97.2 % (ref 96.8–97.8)
Hgb F Quant: 0 % (ref 0.0–2.0)
Hgb S Quant: 0 %

## 2014-02-25 LAB — CULTURE, OB URINE

## 2014-02-26 LAB — CYTOLOGY - PAP

## 2014-03-01 ENCOUNTER — Telehealth: Payer: Self-pay | Admitting: *Deleted

## 2014-03-01 ENCOUNTER — Encounter: Payer: Self-pay | Admitting: Advanced Practice Midwife

## 2014-03-01 ENCOUNTER — Other Ambulatory Visit: Payer: Self-pay | Admitting: Advanced Practice Midwife

## 2014-03-01 DIAGNOSIS — N390 Urinary tract infection, site not specified: Secondary | ICD-10-CM

## 2014-03-01 HISTORY — DX: Urinary tract infection, site not specified: N39.0

## 2014-03-01 MED ORDER — FLUCONAZOLE 150 MG PO TABS: 150.0000 mg | ORAL_TABLET | Freq: Once | ORAL | Status: DC

## 2014-03-01 MED ORDER — AMPICILLIN 500 MG PO CAPS: 500.0000 mg | ORAL_CAPSULE | Freq: Four times a day (QID) | ORAL | Status: DC

## 2014-03-01 MED ORDER — CEPHALEXIN 500 MG PO CAPS: 500.0000 mg | ORAL_CAPSULE | Freq: Four times a day (QID) | ORAL | Status: DC

## 2014-03-01 NOTE — Telephone Encounter (Signed)
Message copied by Mannie Stabile on Thu Mar 01, 2014 10:01 AM ------      Message from: Wynelle Bourgeois L      Created: Thu Mar 01, 2014  8:59 AM      Regarding: Rx for UTI       Tried to put in orders for antibiotics, would not eprescribe, have a ticket in to [employer] on this            She has UTI with 2 bacteria            Needs 2 prescriptions, due to resistance            Needs Keflex 500mg  qid x 7 d                 Ampicillin 500mg  qid x 7       then probably Diflucan 150mg  x 1            I put the antibiotics inas "orders only", but don't think they will go through.             Marie ------

## 2014-03-01 NOTE — Telephone Encounter (Signed)
Rx sent to pharmacy. Called and left message for patient to call us back.

## 2014-03-01 NOTE — Progress Notes (Signed)
Has UTI with 2 pathogens. They have different susceptabilites and resistances. Consulted Dr Emelda Fear.  Recommends Rx two antibiotics

## 2014-03-05 NOTE — Telephone Encounter (Signed)
Called patient, no answer- left message that we are trying to reach you with some results, nothing urgent but please call us back at the clinics 

## 2014-03-06 NOTE — Telephone Encounter (Signed)
Patient has appointment in clinic tomorrow. Note made on visit to inform her of results and the need to pick up rx.

## 2014-03-06 NOTE — Telephone Encounter (Signed)
Pt returned our call and I informed her of UTI requiring 2 antibiotics. She should pick up medication today and take each prescription until completed. She will also receive another medication to treat vaginal yeast which may occur from taking the antibiotics. She should take this pill after the antibiotics are completed. Pt voiced understanding of information and instructions. She confirmed that she has clinic appt tomorrow @ 1400.

## 2014-03-07 ENCOUNTER — Encounter: Payer: Self-pay | Admitting: Obstetrics and Gynecology

## 2014-03-07 ENCOUNTER — Ambulatory Visit (INDEPENDENT_AMBULATORY_CARE_PROVIDER_SITE_OTHER): Payer: Medicaid Other | Admitting: Obstetrics and Gynecology

## 2014-03-07 VITALS — BP 109/67 | HR 90 | Temp 98.2°F | Wt 189.5 lb

## 2014-03-07 DIAGNOSIS — Z3493 Encounter for supervision of normal pregnancy, unspecified, third trimester: Secondary | ICD-10-CM

## 2014-03-07 DIAGNOSIS — Z3480 Encounter for supervision of other normal pregnancy, unspecified trimester: Secondary | ICD-10-CM

## 2014-03-07 LAB — POCT URINALYSIS DIP (DEVICE)
Glucose, UA: NEGATIVE mg/dL
Ketones, ur: NEGATIVE mg/dL
NITRITE: NEGATIVE
PROTEIN: 30 mg/dL — AB
Specific Gravity, Urine: >=1.03 (ref 1.005–1.030)
Urobilinogen, UA: 1 mg/dL (ref 0.0–1.0)
pH: 6 (ref 5.0–8.0)

## 2014-03-07 NOTE — Progress Notes (Signed)
Plans breastfeeding and no contraception. Will schedule US to F/U anatomy. No UTI sx, started abx for ASB.

## 2014-03-07 NOTE — Progress Notes (Signed)
Pt obtained Rx for UTI yesterday - will take diflucan once they are completed.

## 2014-03-07 NOTE — Patient Instructions (Signed)
Third Trimester of Pregnancy The third trimester is from week 29 through week 42, months 7 through 9. The third trimester is a time when the fetus is growing rapidly. At the end of the ninth month, the fetus is about 20 inches in length and weighs 6-10 pounds.  BODY CHANGES Your body goes through many changes during pregnancy. The changes vary from woman to woman.   Your weight will continue to increase. You can expect to gain 25-35 pounds (11-16 kg) by the end of the pregnancy.  You may begin to get stretch marks on your hips, abdomen, and breasts.  You may urinate more often because the fetus is moving lower into your pelvis and pressing on your bladder.  You may develop or continue to have heartburn as a result of your pregnancy.  You may develop constipation because certain hormones are causing the muscles that push waste through your intestines to slow down.  You may develop hemorrhoids or swollen, bulging veins (varicose veins).  You may have pelvic pain because of the weight gain and pregnancy hormones relaxing your joints between the bones in your pelvis. Backaches may result from overexertion of the muscles supporting your posture.  You may have changes in your hair. These can include thickening of your hair, rapid growth, and changes in texture. Some women also have hair loss during or after pregnancy, or hair that feels dry or thin. Your hair will most likely return to normal after your baby is born.  Your breasts will continue to grow and be tender. A yellow discharge may leak from your breasts called colostrum.  Your belly button may stick out.  You may feel short of breath because of your expanding uterus.  You may notice the fetus "dropping," or moving lower in your abdomen.  You may have a bloody mucus discharge. This usually occurs a few days to a week before labor begins.  Your cervix becomes thin and soft (effaced) near your due date. WHAT TO EXPECT AT YOUR PRENATAL  EXAMS  You will have prenatal exams every 2 weeks until week 36. Then, you will have weekly prenatal exams. During a routine prenatal visit:  You will be weighed to make sure you and the fetus are growing normally.  Your blood pressure is taken.  Your abdomen will be measured to track your baby's growth.  The fetal heartbeat will be listened to.  Any test results from the previous visit will be discussed.  You may have a cervical check near your due date to see if you have effaced. At around 36 weeks, your caregiver will check your cervix. At the same time, your caregiver will also perform a test on the secretions of the vaginal tissue. This test is to determine if a type of bacteria, Group B streptococcus, is present. Your caregiver will explain this further. Your caregiver may ask you:  What your birth plan is.  How you are feeling.  If you are feeling the baby move.  If you have had any abnormal symptoms, such as leaking fluid, bleeding, severe headaches, or abdominal cramping.  If you have any questions. Other tests or screenings that may be performed during your third trimester include:  Blood tests that check for low iron levels (anemia).  Fetal testing to check the health, activity level, and growth of the fetus. Testing is done if you have certain medical conditions or if there are problems during the pregnancy. FALSE LABOR You may feel small, irregular contractions that   eventually go away. These are called Braxton Hicks contractions, or false labor. Contractions may last for hours, days, or even weeks before true labor sets in. If contractions come at regular intervals, intensify, or become painful, it is best to be seen by your caregiver.  SIGNS OF LABOR   Menstrual-like cramps.  Contractions that are 5 minutes apart or less.  Contractions that start on the top of the uterus and spread down to the lower abdomen and back.  A sense of increased pelvic pressure or back  pain.  A watery or bloody mucus discharge that comes from the vagina. If you have any of these signs before the 37th week of pregnancy, call your caregiver right away. You need to go to the hospital to get checked immediately. HOME CARE INSTRUCTIONS   Avoid all smoking, herbs, alcohol, and unprescribed drugs. These chemicals affect the formation and growth of the baby.  Follow your caregiver's instructions regarding medicine use. There are medicines that are either safe or unsafe to take during pregnancy.  Exercise only as directed by your caregiver. Experiencing uterine cramps is a good sign to stop exercising.  Continue to eat regular, healthy meals.  Wear a good support bra for breast tenderness.  Do not use hot tubs, steam rooms, or saunas.  Wear your seat belt at all times when driving.  Avoid raw meat, uncooked cheese, cat litter boxes, and soil used by cats. These carry germs that can cause birth defects in the baby.  Take your prenatal vitamins.  Try taking a stool softener (if your caregiver approves) if you develop constipation. Eat more high-fiber foods, such as fresh vegetables or fruit and whole grains. Drink plenty of fluids to keep your urine clear or pale yellow.  Take warm sitz baths to soothe any pain or discomfort caused by hemorrhoids. Use hemorrhoid cream if your caregiver approves.  If you develop varicose veins, wear support hose. Elevate your feet for 15 minutes, 3-4 times a day. Limit salt in your diet.  Avoid heavy lifting, wear low heal shoes, and practice good posture.  Rest a lot with your legs elevated if you have leg cramps or low back pain.  Visit your dentist if you have not gone during your pregnancy. Use a soft toothbrush to brush your teeth and be gentle when you floss.  A sexual relationship may be continued unless your caregiver directs you otherwise.  Do not travel far distances unless it is absolutely necessary and only with the approval  of your caregiver.  Take prenatal classes to understand, practice, and ask questions about the labor and delivery.  Make a trial run to the hospital.  Pack your hospital bag.  Prepare the baby's nursery.  Continue to go to all your prenatal visits as directed by your caregiver. SEEK MEDICAL CARE IF:  You are unsure if you are in labor or if your water has broken.  You have dizziness.  You have mild pelvic cramps, pelvic pressure, or nagging pain in your abdominal area.  You have persistent nausea, vomiting, or diarrhea.  You have a bad smelling vaginal discharge.  You have pain with urination. SEEK IMMEDIATE MEDICAL CARE IF:   You have a fever.  You are leaking fluid from your vagina.  You have spotting or bleeding from your vagina.  You have severe abdominal cramping or pain.  You have rapid weight loss or gain.  You have shortness of breath with chest pain.  You notice sudden or extreme swelling   of your face, hands, ankles, feet, or legs.  You have not felt your baby move in over an hour.  You have severe headaches that do not go away with medicine.  You have vision changes. Document Released: 09/01/2001 Document Revised: 09/12/2013 Document Reviewed: 11/08/2012 [employer] Patient Information 2015 [employer]. This information is not intended to replace advice given to you by your health care provider. Make sure you discuss any questions you have with your health care provider.  

## 2014-03-13 ENCOUNTER — Ambulatory Visit (HOSPITAL_COMMUNITY)
Admission: RE | Admit: 2014-03-13 | Discharge: 2014-03-13 | Disposition: A | Discharge status: Home / Self Care | Payer: Medicaid Other | Source: Ambulatory Visit | Attending: Obstetrics and Gynecology | Admitting: Obstetrics and Gynecology

## 2014-03-13 DIAGNOSIS — Z3493 Encounter for supervision of normal pregnancy, unspecified, third trimester: Secondary | ICD-10-CM

## 2014-03-13 DIAGNOSIS — Z3689 Encounter for other specified antenatal screening: Secondary | ICD-10-CM | POA: Insufficient documentation

## 2014-03-13 DIAGNOSIS — O0933 Supervision of pregnancy with insufficient antenatal care, third trimester: Secondary | ICD-10-CM

## 2014-04-02 ENCOUNTER — Telehealth: Payer: Self-pay

## 2014-04-02 DIAGNOSIS — O0933 Supervision of pregnancy with insufficient antenatal care, third trimester: Secondary | ICD-10-CM

## 2014-04-02 MED ORDER — COMPLETENATE 29-1 MG PO CHEW: 1.0000 | CHEWABLE_TABLET | Freq: Every day | ORAL | Status: DC

## 2014-04-02 NOTE — Telephone Encounter (Signed)
Patient called requesting refill of PNV. PNV sent to pharmacy. Called patient and left message informing her of RX available at pharmacy.

## 2014-04-11 ENCOUNTER — Ambulatory Visit (INDEPENDENT_AMBULATORY_CARE_PROVIDER_SITE_OTHER): Payer: Medicaid Other | Admitting: Advanced Practice Midwife

## 2014-04-11 VITALS — BP 115/62 | HR 86 | Temp 98.3°F | Wt 196.3 lb

## 2014-04-11 DIAGNOSIS — O0930 Supervision of pregnancy with insufficient antenatal care, unspecified trimester: Secondary | ICD-10-CM

## 2014-04-11 DIAGNOSIS — O368131 Decreased fetal movements, third trimester, fetus 1: Secondary | ICD-10-CM

## 2014-04-11 DIAGNOSIS — O368190 Decreased fetal movements, unspecified trimester, not applicable or unspecified: Secondary | ICD-10-CM

## 2014-04-11 DIAGNOSIS — O0933 Supervision of pregnancy with insufficient antenatal care, third trimester: Secondary | ICD-10-CM

## 2014-04-11 DIAGNOSIS — O3090 Multiple gestation, unspecified, unspecified trimester: Secondary | ICD-10-CM

## 2014-04-11 LAB — POCT URINALYSIS DIP (DEVICE)
Glucose, UA: NEGATIVE mg/dL
HGB URINE DIPSTICK: NEGATIVE
Ketones, ur: NEGATIVE mg/dL
NITRITE: NEGATIVE
Protein, ur: NEGATIVE mg/dL
Specific Gravity, Urine: 1.02 (ref 1.005–1.030)
Urobilinogen, UA: 0.2 mg/dL (ref 0.0–1.0)
pH: 6 (ref 5.0–8.0)

## 2014-04-11 NOTE — Progress Notes (Signed)
Seen also by me, Agree with note.  NST reactive

## 2014-04-11 NOTE — Progress Notes (Signed)
Patient reports that she is doing well.  States that the baby has been less active over the last week. +FM (less than normal), no vaginal bleeding, no vaginal discharge, +belly pressure but no contractions, no edema  UA reviewed. 6/23 US reviewed. NST ordered.

## 2014-04-11 NOTE — Patient Instructions (Addendum)
It was a pleasure seeing you today!  Information regarding what we discussed is included in this packet.  Concerning your question, you will need to schedule an appointment with an OB/GYN doctor to have this procedure done.  It is not possible to do this at delivery.  Please feel free to call our office if any questions or concerns arise.  Third Trimester of Pregnancy The third trimester is from week 29 through week 42, months 7 through 9. This trimester is when your unborn baby (fetus) is growing very fast. At the end of the ninth month, the unborn baby is about 20 inches in length. It weighs about 6-10 pounds.  HOME CARE   Avoid all smoking, herbs, and alcohol. Avoid drugs not approved by your doctor.  Only take medicine as told by your doctor. Some medicines are safe and some are not during pregnancy.  Exercise only as told by your doctor. Stop exercising if you start having cramps.  Eat regular, healthy meals.  Wear a good support bra if your breasts are tender.  Do not use hot tubs, steam rooms, or saunas.  Wear your seat belt when driving.  Avoid raw meat, uncooked cheese, and liter boxes and soil used by cats.  Take your prenatal vitamins.  Try taking medicine that helps you poop (stool softener) as needed, and if your doctor approves. Eat more fiber by eating fresh fruit, vegetables, and whole grains. Drink enough fluids to keep your pee (urine) clear or pale yellow.  Take warm water baths (sitz baths) to soothe pain or discomfort caused by hemorrhoids. Use hemorrhoid cream if your doctor approves.  If you have puffy, bulging veins (varicose veins), wear support hose. Raise (elevate) your feet for 15 minutes, 3-4 times a day. Limit salt in your diet.  Avoid heavy lifting, wear low heels, and sit up straight.  Rest with your legs raised if you have leg cramps or low back pain.  Visit your dentist if you have not gone during your pregnancy. Use a soft toothbrush to brush your  teeth. Be gentle when you floss.  You can have sex (intercourse) unless your doctor tells you not to.  Do not travel far distances unless you must. Only do so with your doctor's approval.  Take prenatal classes.  Practice driving to the hospital.  Pack your hospital bag.  Prepare the baby's room.  Go to your doctor visits. GET HELP IF:  You are not sure if you are in labor or if your water has broken.  You are dizzy.  You have mild cramps or pressure in your lower belly (abdominal).  You have a nagging pain in your belly area.  You continue to feel sick to your stomach (nauseous), throw up (vomit), or have watery poop (diarrhea).  You have bad smelling fluid coming from your vagina.  You have pain with peeing (urination). GET HELP RIGHT AWAY IF:   You have a fever.  You are leaking fluid from your vagina.  You are spotting or bleeding from your vagina.  You have severe belly cramping or pain.  You lose or gain weight rapidly.  You have trouble catching your breath and have chest pain.  You notice sudden or extreme puffiness (swelling) of your face, hands, ankles, feet, or legs.  You have not felt the baby move in over an hour.  You have severe headaches that do not go away with medicine.  You have vision changes. Document Released: 12/02/2009 Document Revised: 01/02/2013  Document Reviewed: 11/08/2012 [facility] Patient Information 2015 [place], [place]. This information is not intended to replace advice given to you by your health care provider. Make sure you discuss any questions you have with your health care provider.

## 2014-04-11 NOTE — Progress Notes (Signed)
Patient reports vaginal pressure; states baby hasn't been as active for past week

## 2014-04-23 ENCOUNTER — Ambulatory Visit (INDEPENDENT_AMBULATORY_CARE_PROVIDER_SITE_OTHER): Payer: Medicaid Other | Admitting: Family Medicine

## 2014-04-23 VITALS — BP 116/60 | HR 95 | Temp 98.3°F | Wt 199.5 lb

## 2014-04-23 DIAGNOSIS — O0930 Supervision of pregnancy with insufficient antenatal care, unspecified trimester: Secondary | ICD-10-CM

## 2014-04-23 DIAGNOSIS — O0933 Supervision of pregnancy with insufficient antenatal care, third trimester: Secondary | ICD-10-CM

## 2014-04-23 LAB — POCT URINALYSIS DIP (DEVICE)
BILIRUBIN URINE: NEGATIVE
GLUCOSE, UA: NEGATIVE mg/dL
HGB URINE DIPSTICK: NEGATIVE
KETONES UR: NEGATIVE mg/dL
Leukocytes, UA: NEGATIVE
Nitrite: NEGATIVE
Protein, ur: NEGATIVE mg/dL
Specific Gravity, Urine: 1.025 (ref 1.005–1.030)
Urobilinogen, UA: 0.2 mg/dL (ref 0.0–1.0)
pH: 6.5 (ref 5.0–8.0)

## 2014-04-23 NOTE — Progress Notes (Signed)
Having some contractions Needs cultures Labor precautions

## 2014-04-23 NOTE — Patient Instructions (Addendum)
Vaginal Delivery During delivery, your health care provider will help you give birth to your baby. During a vaginal delivery, you will work to push the baby out of your vagina. However, before you can push your baby out, a few things need to happen. The opening of your uterus (cervix) has to soften, thin out, and open up (dilate) all the way to 10 cm. Also, your baby has to move down from the uterus into your vagina.  SIGNS OF LABOR  Your health care provider will first need to make sure you are in labor. Signs of labor include:   Passing what is called the mucous plug before labor begins. This is a small amount of blood-stained mucus.  Having regular, painful uterine contractions.   The time between contractions gets shorter.   The discomfort and pain gradually get more intense.  Contraction pains get worse when walking and do not go away when resting.   Your cervix becomes thinner (effacement) and dilates. BEFORE THE DELIVERY Once you are in labor and admitted into the hospital or care center, your health care provider may do the following:   Perform a complete physical exam.  Review any complications related to pregnancy or labor.  Check your blood pressure, pulse, temperature, and heart rate (vital signs).   Determine if, and when, the rupture of amniotic membranes occurred.  Do a vaginal exam (using a sterile glove and lubricant) to determine:   The position (presentation) of the baby. Is the baby's head presenting first (vertex) in the birth canal (vagina), or are the feet or buttocks first (breech)?   The level (station) of the baby's head within the birth canal.   The effacement and dilatation of the cervix.   An electronic fetal monitor is usually placed on your abdomen when you first arrive. This is used to monitor your contractions and the baby's heart rate.  When the monitor is on your abdomen (external fetal monitor), it can only pick up the frequency and  length of your contractions. It cannot tell the strength of your contractions.  If it becomes necessary for your health care provider to know exactly how strong your contractions are or to see exactly what the baby's heart rate is doing, an internal monitor may be inserted into your vagina and uterus. Your health care provider will discuss the benefits and risks of using an internal monitor and obtain your permission before inserting the device.  Continuous fetal monitoring may be needed if you have an epidural, are receiving certain medicines (such as oxytocin), or have pregnancy or labor complications.  An IV access tube may be placed into a vein in your arm to deliver fluids and medicines if necessary. THREE STAGES OF LABOR AND DELIVERY Normal labor and delivery is divided into three stages. First Stage This stage starts when you begin to contract regularly and your cervix begins to efface and dilate. It ends when your cervix is completely open (fully dilated). The first stage is the longest stage of labor and can last from 3 hours to 15 hours.  Several methods are available to help with labor pain. You and your health care provider will decide which option is best for you. Options include:   Opioid medicines. These are strong pain medicines that you can get through your IV tube or as a shot into your muscle. These medicines lessen pain but do not make it go away completely.  Epidural. A medicine is given through a thin tube that   is inserted in your back. The medicine numbs the lower part of your body and prevents any pain in that area.  Paracervical pain medicine. This is an injection of an anesthetic on each side of your cervix.   You may request natural childbirth, which does not involve the use of pain medicines or an epidural during labor and delivery. Instead, you will use other things, such as breathing exercises, to help cope with the pain. Second Stage The second stage of labor  begins when your cervix is fully dilated at 10 cm. It continues until you push your baby down through the birth canal and the baby is born. This stage can take only minutes or several hours.  The location of your baby's head as it moves through the birth canal is reported as a number called a station. If the baby's head has not started its descent, the station is described as being at minus 3 (-3). When your baby's head is at the zero station, it is at the middle of the birth canal and is engaged in the pelvis. The station of your baby helps indicate the progress of the second stage of labor.  When your baby is born, your health care provider may hold the baby with his or her head lowered to prevent amniotic fluid, mucus, and blood from getting into the baby's lungs. The baby's mouth and nose may be suctioned with a small bulb syringe to remove any additional fluid.  Your health care provider may then place the baby on your stomach. It is important to keep the baby from getting cold. To do this, the health care provider will dry the baby off, place the baby directly on your skin (with no blankets between you and the baby), and cover the baby with warm, dry blankets.   The umbilical cord is cut. Third Stage During the third stage of labor, your health care provider will deliver the placenta (afterbirth) and make sure your bleeding is under control. The delivery of the placenta usually takes about 5 minutes but can take up to 30 minutes. After the placenta is delivered, a medicine may be given either by IV or injection to help contract the uterus and control bleeding. If you are planning to breastfeed, you can try to do so now. After you deliver the placenta, your uterus should contract and get very firm. If your uterus does not remain firm, your health care provider will massage it. This is important because the contraction of the uterus helps cut off bleeding at the site where the placenta was attached  to your uterus. If your uterus does not contract properly and stay firm, you may continue to bleed heavily. If there is a lot of bleeding, medicines may be given to contract the uterus and stop the bleeding.  Document Released: 06/16/2008 Document Revised: 01/22/2014 Document Reviewed: 02/26/2013 [facility] Patient Information 2015 [place] BeachExitCare, [place]. This information is not intended to replace advice given to you by your health care provider. Make sure you discuss any questions you have with your health care provider.  Third Trimester of Pregnancy The third trimester is from week 29 through week 42, months 7 through 9. The third trimester is a time when the fetus is growing rapidly. At the end of the ninth month, the fetus is about 20 inches in length and weighs 6-10 pounds.  BODY CHANGES Your body goes through many changes during pregnancy. The changes vary from woman to woman.   Your weight will  continue to increase. You can expect to gain 25-35 pounds (11-16 kg) by the end of the pregnancy.  You may begin to get stretch marks on your hips, abdomen, and breasts.  You may urinate more often because the fetus is moving lower into your pelvis and pressing on your bladder.  You may develop or continue to have heartburn as a result of your pregnancy.  You may develop constipation because certain hormones are causing the muscles that push waste through your intestines to slow down.  You may develop hemorrhoids or swollen, bulging veins (varicose veins).  You may have pelvic pain because of the weight gain and pregnancy hormones relaxing your joints between the bones in your pelvis. Backaches may result from overexertion of the muscles supporting your posture.  You may have changes in your hair. These can include thickening of your hair, rapid growth, and changes in texture. Some women also have hair loss during or after pregnancy, or hair that feels dry or thin. Your hair will most likely return to  normal after your baby is born.  Your breasts will continue to grow and be tender. A yellow discharge may leak from your breasts called colostrum.  Your belly button may stick out.  You may feel short of breath because of your expanding uterus.  You may notice the fetus "dropping," or moving lower in your abdomen.  You may have a bloody mucus discharge. This usually occurs a few days to a week before labor begins.  Your cervix becomes thin and soft (effaced) near your due date. WHAT TO EXPECT AT YOUR PRENATAL EXAMS  You will have prenatal exams every 2 weeks until week 36. Then, you will have weekly prenatal exams. During a routine prenatal visit:  You will be weighed to make sure you and the fetus are growing normally.  Your blood pressure is taken.  Your abdomen will be measured to track your baby's growth.  The fetal heartbeat will be listened to.  Any test results from the previous visit will be discussed.  You may have a cervical check near your due date to see if you have effaced. At around 36 weeks, your caregiver will check your cervix. At the same time, your caregiver will also perform a test on the secretions of the vaginal tissue. This test is to determine if a type of bacteria, Group B streptococcus, is present. Your caregiver will explain this further. Your caregiver may ask you:  What your birth plan is.  How you are feeling.  If you are feeling the baby move.  If you have had any abnormal symptoms, such as leaking fluid, bleeding, severe headaches, or abdominal cramping.  If you have any questions. Other tests or screenings that may be performed during your third trimester include:  Blood tests that check for low iron levels (anemia).  Fetal testing to check the health, activity level, and growth of the fetus. Testing is done if you have certain medical conditions or if there are problems during the pregnancy. FALSE LABOR You may feel small, irregular  contractions that eventually go away. These are called Braxton Hicks contractions, or false labor. Contractions may last for hours, days, or even weeks before true labor sets in. If contractions come at regular intervals, intensify, or become painful, it is best to be seen by your caregiver.  SIGNS OF LABOR   Menstrual-like cramps.  Contractions that are 5 minutes apart or less.  Contractions that start on the top of the  the uterus and spread down to the lower abdomen and back. °· A sense of increased pelvic pressure or back pain. °· A watery or bloody mucus discharge that comes from the vagina. °If you have any of these signs before the 37th week of pregnancy, call your caregiver right away. You need to go to the hospital to get checked immediately. °HOME CARE INSTRUCTIONS  °· Avoid all smoking, herbs, alcohol, and unprescribed drugs. These chemicals affect the formation and growth of the baby. °· Follow your caregiver's instructions regarding medicine use. There are medicines that are either safe or unsafe to take during pregnancy. °· Exercise only as directed by your caregiver. Experiencing uterine cramps is a good sign to stop exercising. °· Continue to eat regular, healthy meals. °· Wear a good support bra for breast tenderness. °· Do not use hot tubs, steam rooms, or saunas. °· Wear your seat belt at all times when driving. °· Avoid raw meat, uncooked cheese, cat litter boxes, and soil used by cats. These carry germs that can cause birth defects in the baby. °· Take your prenatal vitamins. °· Try taking a stool softener (if your caregiver approves) if you develop constipation. Eat more high-fiber foods, such as fresh vegetables or fruit and whole grains. Drink plenty of fluids to keep your urine clear or pale yellow. °· Take warm sitz baths to soothe any pain or discomfort caused by hemorrhoids. Use hemorrhoid cream if your caregiver approves. °· If you develop varicose veins, wear support hose. Elevate  your feet for 15 minutes, 3-4 times a day. Limit salt in your diet. °· Avoid heavy lifting, wear low heal shoes, and practice good posture. °· Rest a lot with your legs elevated if you have leg cramps or low back pain. °· Visit your dentist if you have not gone during your pregnancy. Use a soft toothbrush to brush your teeth and be gentle when you floss. °· A sexual relationship may be continued unless your caregiver directs you otherwise. °· Do not travel far distances unless it is absolutely necessary and only with the approval of your caregiver. °· Take prenatal classes to understand, practice, and ask questions about the labor and delivery. °· Make a trial run to the hospital. °· Pack your hospital bag. °· Prepare the baby's nursery. °· Continue to go to all your prenatal visits as directed by your caregiver. °SEEK MEDICAL CARE IF: °· You are unsure if you are in labor or if your water has broken. °· You have dizziness. °· You have mild pelvic cramps, pelvic pressure, or nagging pain in your abdominal area. °· You have persistent nausea, vomiting, or diarrhea. °· You have a bad smelling vaginal discharge. °· You have pain with urination. °SEEK IMMEDIATE MEDICAL CARE IF:  °· You have a fever. °· You are leaking fluid from your vagina. °· You have spotting or bleeding from your vagina. °· You have severe abdominal cramping or pain. °· You have rapid weight loss or gain. °· You have shortness of breath with chest pain. °· You notice sudden or extreme swelling of your face, hands, ankles, feet, or legs. °· You have not felt your baby move in over an hour. °· You have severe headaches that do not go away with medicine. °· You have vision changes. °Document Released: 09/01/2001 Document Revised: 09/12/2013 Document Reviewed: 11/08/2012 °[employer]® Patient Information ©2015 [employer]. This information is not intended to replace advice given to you by your health care provider. Make sure you   any questions you  have with your health care provider.  Breastfeeding Deciding to breastfeed is one of the best choices you can make for you and your baby. A change in hormones during pregnancy causes your breast tissue to grow and increases the number and size of your milk ducts. These hormones also allow proteins, sugars, and fats from your blood supply to make breast milk in your milk-producing glands. Hormones prevent breast milk from being released before your baby is born as well as prompt milk flow after birth. Once breastfeeding has begun, thoughts of your baby, as well as his or her sucking or crying, can stimulate the release of milk from your milk-producing glands.  BENEFITS OF BREASTFEEDING For Your Baby  Your first milk (colostrum) helps your baby's digestive system function better.   There are antibodies in your milk that help your baby fight off infections.   Your baby has a lower incidence of asthma, allergies, and sudden infant death syndrome.   The nutrients in breast milk are better for your baby than infant formulas and are designed uniquely for your baby's needs.   Breast milk improves your baby's brain development.   Your baby is less likely to develop other conditions, such as childhood obesity, asthma, or type 2 diabetes mellitus.  For You   Breastfeeding helps to create a very special bond between you and your baby.   Breastfeeding is convenient. Breast milk is always available at the correct temperature and costs nothing.   Breastfeeding helps to burn calories and helps you lose the weight gained during pregnancy.   Breastfeeding makes your uterus contract to its prepregnancy size faster and slows bleeding (lochia) after you give birth.   Breastfeeding helps to lower your risk of developing type 2 diabetes mellitus, osteoporosis, and breast or ovarian cancer later in life. SIGNS THAT YOUR BABY IS HUNGRY Early Signs of Hunger  Increased alertness or  activity.  Stretching.  Movement of the head from side to side.  Movement of the head and opening of the mouth when the corner of the mouth or cheek is stroked (rooting).  Increased sucking sounds, smacking lips, cooing, sighing, or squeaking.  Hand-to-mouth movements.  Increased sucking of fingers or hands. Late Signs of Hunger  Fussing.  Intermittent crying. Extreme Signs of Hunger Signs of extreme hunger will require calming and consoling before your baby will be able to breastfeed successfully. Do not wait for the following signs of extreme hunger to occur before you initiate breastfeeding:   Restlessness.  A loud, strong cry.   Screaming. BREASTFEEDING BASICS Breastfeeding Initiation  Find a comfortable place to sit or lie down, with your neck and back well supported.  Place a pillow or rolled up blanket under your baby to bring him or her to the level of your breast (if you are seated). Nursing pillows are specially designed to help support your arms and your baby while you breastfeed.  Make sure that your baby's abdomen is facing your abdomen.   Gently massage your breast. With your fingertips, massage from your chest wall toward your nipple in a circular motion. This encourages milk flow. You may need to continue this action during the feeding if your milk flows slowly.  Support your breast with 4 fingers underneath and your thumb above your nipple. Make sure your fingers are well away from your nipple and your baby's mouth.   Stroke your baby's lips gently with your finger or nipple.   When your baby's  mouth is open wide enough, quickly bring your baby to your breast, placing your entire nipple and as much of the colored area around your nipple (areola) as possible into your baby's mouth.   More areola should be visible above your baby's upper lip than below the lower lip.   Your baby's tongue should be between his or her lower gum and your breast.    Ensure that your baby's mouth is correctly positioned around your nipple (latched). Your baby's lips should create a seal on your breast and be turned out (everted).  It is common for your baby to suck about 2-3 minutes in order to start the flow of breast milk. Latching Teaching your baby how to latch on to your breast properly is very important. An improper latch can cause nipple pain and decreased milk supply for you and poor weight gain in your baby. Also, if your baby is not latched onto your nipple properly, he or she may swallow some air during feeding. This can make your baby fussy. Burping your baby when you switch breasts during the feeding can help to get rid of the air. However, teaching your baby to latch on properly is still the best way to prevent fussiness from swallowing air while breastfeeding. Signs that your baby has successfully latched on to your nipple:    Silent tugging or silent sucking, without causing you pain.   Swallowing heard between every 3-4 sucks.    Muscle movement above and in front of his or her ears while sucking.  Signs that your baby has not successfully latched on to nipple:   Sucking sounds or smacking sounds from your baby while breastfeeding.  Nipple pain. If you think your baby has not latched on correctly, slip your finger into the corner of your baby's mouth to break the suction and place it between your baby's gums. Attempt breastfeeding initiation again. Signs of Successful Breastfeeding Signs from your baby:   A gradual decrease in the number of sucks or complete cessation of sucking.   Falling asleep.   Relaxation of his or her body.   Retention of a small amount of milk in his or her mouth.   Letting go of your breast by himself or herself. Signs from you:  Breasts that have increased in firmness, weight, and size 1-3 hours after feeding.   Breasts that are softer immediately after breastfeeding.  Increased milk  volume, as well as a change in milk consistency and color by the fifth day of breastfeeding.   Nipples that are not sore, cracked, or bleeding. Signs That Your Pecola Leisure is Getting Enough Milk  Wetting at least 3 diapers in a 24-hour period. The urine should be clear and pale yellow by age 17 days.  At least 3 stools in a 24-hour period by age 17 days. The stool should be soft and yellow.  At least 3 stools in a 24-hour period by age 32 days. The stool should be seedy and yellow.  No loss of weight greater than 10% of birth weight during the first 107 days of age.  Average weight gain of 4-7 ounces (113-198 g) per week after age 81 days.  Consistent daily weight gain by age 17 days, without weight loss after the age of 2 weeks. After a feeding, your baby may spit up a small amount. This is common. BREASTFEEDING FREQUENCY AND DURATION Frequent feeding will help you make more milk and can prevent sore nipples and breast engorgement. Breastfeed when  you feel the need to reduce the fullness of your breasts or when your baby shows signs of hunger. This is called "breastfeeding on demand." Avoid introducing a pacifier to your baby while you are working to establish breastfeeding (the first 4-6 weeks after your baby is born). After this time you may choose to use a pacifier. Research has shown that pacifier use during the first year of a baby's life decreases the risk of sudden infant death syndrome (SIDS). Allow your baby to feed on each breast as long as he or she wants. Breastfeed until your baby is finished feeding. When your baby unlatches or falls asleep while feeding from the first breast, offer the second breast. Because newborns are often sleepy in the first few weeks of life, you may need to awaken your baby to get him or her to feed. Breastfeeding times will vary from baby to baby. However, the following rules can serve as a guide to help you ensure that your baby is properly fed:  Newborns (babies 40  weeks of age or younger) may breastfeed every 1-3 hours.  Newborns should not go longer than 3 hours during the day or 5 hours during the night without breastfeeding.  You should breastfeed your baby a minimum of 8 times in a 24-hour period until you begin to introduce solid foods to your baby at around 48 months of age. BREAST MILK PUMPING Pumping and storing breast milk allows you to ensure that your baby is exclusively fed your breast milk, even at times when you are unable to breastfeed. This is especially important if you are going back to work while you are still breastfeeding or when you are not able to be present during feedings. Your lactation consultant can give you guidelines on how long it is safe to store breast milk.  A breast pump is a machine that allows you to pump milk from your breast into a sterile bottle. The pumped breast milk can then be stored in a refrigerator or freezer. Some breast pumps are operated by hand, while others use electricity. Ask your lactation consultant which type will work best for you. Breast pumps can be purchased, but some hospitals and breastfeeding support groups lease breast pumps on a monthly basis. A lactation consultant can teach you how to hand express breast milk, if you prefer not to use a pump.  CARING FOR YOUR BREASTS WHILE YOU BREASTFEED Nipples can become dry, cracked, and sore while breastfeeding. The following recommendations can help keep your breasts moisturized and healthy:  Avoid using soap on your nipples.   Wear a supportive bra. Although not required, special nursing bras and tank tops are designed to allow access to your breasts for breastfeeding without taking off your entire bra or top. Avoid wearing underwire-style bras or extremely tight bras.  Air dry your nipples for 3-81minutes after each feeding.   Use only cotton bra pads to absorb leaked breast milk. Leaking of breast milk between feedings is normal.   Use lanolin on  your nipples after breastfeeding. Lanolin helps to maintain your skin's normal moisture barrier. If you use pure lanolin, you do not need to wash it off before feeding your baby again. Pure lanolin is not toxic to your baby. You may also hand express a few drops of breast milk and gently massage that milk into your nipples and allow the milk to air dry. In the first few weeks after giving birth, some women experience extremely full breasts (engorgement). Engorgement  can make your breasts feel heavy, warm, and tender to the touch. Engorgement peaks within 3-5 days after you give birth. The following recommendations can help ease engorgement:  Completely empty your breasts while breastfeeding or pumping. You may want to start by applying warm, moist heat (in the shower or with warm water-soaked hand towels) just before feeding or pumping. This increases circulation and helps the milk flow. If your baby does not completely empty your breasts while breastfeeding, pump any extra milk after he or she is finished.  Wear a snug bra (nursing or regular) or tank top for 1-2 days to signal your body to slightly decrease milk production.  Apply ice packs to your breasts, unless this is too uncomfortable for you.  Make sure that your baby is latched on and positioned properly while breastfeeding. If engorgement persists after 48 hours of following these recommendations, contact your health care provider or a Advertising copywriter. OVERALL HEALTH CARE RECOMMENDATIONS WHILE BREASTFEEDING  Eat healthy foods. Alternate between meals and snacks, eating 3 of each per day. Because what you eat affects your breast milk, some of the foods may make your baby more irritable than usual. Avoid eating these foods if you are sure that they are negatively affecting your baby.  Drink milk, fruit juice, and water to satisfy your thirst (about 10 glasses a day).   Rest often, relax, and continue to take your prenatal vitamins to  prevent fatigue, stress, and anemia.  Continue breast self-awareness checks.  Avoid chewing and smoking tobacco.  Avoid alcohol and drug use. Some medicines that may be harmful to your baby can pass through breast milk. It is important to ask your health care provider before taking any medicine, including all over-the-counter and prescription medicine as well as vitamin and herbal supplements. It is possible to become pregnant while breastfeeding. If birth control is desired, ask your health care provider about options that will be safe for your baby. SEEK MEDICAL CARE IF:   You feel like you want to stop breastfeeding or have become frustrated with breastfeeding.  You have painful breasts or nipples.  Your nipples are cracked or bleeding.  Your breasts are red, tender, or warm.  You have a swollen area on either breast.  You have a fever or chills.  You have nausea or vomiting.  You have drainage other than breast milk from your nipples.  Your breasts do not become full before feedings by the fifth day after you give birth.  You feel sad and depressed.  Your baby is too sleepy to eat well.  Your baby is having trouble sleeping.   Your baby is wetting less than 3 diapers in a 24-hour period.  Your baby has less than 3 stools in a 24-hour period.  Your baby's skin or the white part of his or her eyes becomes yellow.   Your baby is not gaining weight by 25 days of age. SEEK IMMEDIATE MEDICAL CARE IF:   Your baby is overly tired (lethargic) and does not want to wake up and feed.  Your baby develops an unexplained fever. Document Released: 09/07/2005 Document Revised: 09/12/2013 Document Reviewed: 03/01/2013 [facility] Patient Information 2015 [place], [place]. This information is not intended to replace advice given to you by your health care provider. Make sure you discuss any questions you have with your health care provider.

## 2014-04-24 LAB — GC/CHLAMYDIA PROBE AMP
CT Probe RNA: NEGATIVE
GC PROBE AMP APTIMA: NEGATIVE

## 2014-04-25 LAB — CULTURE, BETA STREP (GROUP B ONLY)

## 2014-04-26 ENCOUNTER — Encounter: Payer: Self-pay | Admitting: Family Medicine

## 2014-05-01 ENCOUNTER — Encounter (HOSPITAL_COMMUNITY): Payer: Self-pay | Admitting: *Deleted

## 2014-05-01 ENCOUNTER — Inpatient Hospital Stay (HOSPITAL_COMMUNITY)
Admission: AD | Admit: 2014-05-01 | Discharge: 2014-05-02 | DRG: 775 | Disposition: A | Discharge status: Home / Self Care | Payer: Medicaid Other | Source: Ambulatory Visit | Attending: Obstetrics & Gynecology | Admitting: Obstetrics & Gynecology

## 2014-05-01 ENCOUNTER — Encounter: Payer: Self-pay | Admitting: Physician Assistant

## 2014-05-01 ENCOUNTER — Inpatient Hospital Stay (HOSPITAL_COMMUNITY): Payer: Medicaid Other | Admitting: Anesthesiology

## 2014-05-01 ENCOUNTER — Encounter (HOSPITAL_COMMUNITY): Payer: Medicaid Other | Admitting: Anesthesiology

## 2014-05-01 ENCOUNTER — Other Ambulatory Visit: Payer: Medicaid Other

## 2014-05-01 DIAGNOSIS — O0930 Supervision of pregnancy with insufficient antenatal care, unspecified trimester: Secondary | ICD-10-CM

## 2014-05-01 DIAGNOSIS — IMO0001 Reserved for inherently not codable concepts without codable children: Secondary | ICD-10-CM

## 2014-05-01 DIAGNOSIS — N390 Urinary tract infection, site not specified: Secondary | ICD-10-CM

## 2014-05-01 DIAGNOSIS — O0933 Supervision of pregnancy with insufficient antenatal care, third trimester: Secondary | ICD-10-CM

## 2014-05-01 DIAGNOSIS — O479 False labor, unspecified: Secondary | ICD-10-CM | POA: Diagnosis present

## 2014-05-01 LAB — TYPE AND SCREEN
ABO/RH(D): O POS
ANTIBODY SCREEN: NEGATIVE
Unit division: 0

## 2014-05-01 LAB — SAMPLE TO BLOOD BANK

## 2014-05-01 LAB — ABO/RH: ABO/RH(D): O POS

## 2014-05-01 LAB — CBC
HCT: 40.1 % (ref 36.0–46.0)
Hemoglobin: 13.3 g/dL (ref 12.0–15.0)
MCH: 28.6 pg (ref 26.0–34.0)
MCHC: 33.2 g/dL (ref 30.0–36.0)
MCV: 86.2 fL (ref 78.0–100.0)
PLATELETS: 276 10*3/uL (ref 150–400)
RBC: 4.65 MIL/uL (ref 3.87–5.11)
RDW: 14.7 % (ref 11.5–15.5)
WBC: 7.7 10*3/uL (ref 4.0–10.5)

## 2014-05-01 LAB — RPR

## 2014-05-01 MED ORDER — EPHEDRINE 5 MG/ML INJ
10.0000 mg | INTRAVENOUS | Status: DC | PRN
  Filled 2014-05-01: qty 4
  Filled 2014-05-01: qty 2

## 2014-05-01 MED ORDER — SENNOSIDES-DOCUSATE SODIUM 8.6-50 MG PO TABS
2.0000 | ORAL_TABLET | ORAL | Status: DC
  Administered 2014-05-02: 2 via ORAL
  Filled 2014-05-01: qty 2

## 2014-05-01 MED ORDER — PHENYLEPHRINE 40 MCG/ML (10ML) SYRINGE FOR IV PUSH (FOR BLOOD PRESSURE SUPPORT): 80.0000 ug | PREFILLED_SYRINGE | INTRAVENOUS | Status: DC | PRN

## 2014-05-01 MED ORDER — OXYTOCIN 40 UNITS IN LACTATED RINGERS INFUSION - SIMPLE MED: 62.5000 mL/h | INTRAVENOUS | Status: DC

## 2014-05-01 MED ORDER — CITRIC ACID-SODIUM CITRATE 334-500 MG/5ML PO SOLN: 30.0000 mL | ORAL | Status: DC | PRN

## 2014-05-01 MED ORDER — ONDANSETRON HCL 4 MG/2ML IJ SOLN: 4.0000 mg | Freq: Four times a day (QID) | INTRAMUSCULAR | Status: DC | PRN

## 2014-05-01 MED ORDER — LACTATED RINGERS IV SOLN: 500.0000 mL | INTRAVENOUS | Status: DC | PRN

## 2014-05-01 MED ORDER — IBUPROFEN 600 MG PO TABS
600.0000 mg | ORAL_TABLET | Freq: Four times a day (QID) | ORAL | Status: DC
  Administered 2014-05-01 – 2014-05-02 (×4): 600 mg via ORAL
  Filled 2014-05-01 (×4): qty 1

## 2014-05-01 MED ORDER — PRENATAL MULTIVITAMIN CH
1.0000 | ORAL_TABLET | Freq: Every day | ORAL | Status: DC
  Administered 2014-05-02: 1 via ORAL
  Filled 2014-05-01: qty 1

## 2014-05-01 MED ORDER — ZOLPIDEM TARTRATE 5 MG PO TABS: 5.0000 mg | ORAL_TABLET | Freq: Every evening | ORAL | Status: DC | PRN

## 2014-05-01 MED ORDER — OXYTOCIN BOLUS FROM INFUSION: 500.0000 mL | INTRAVENOUS | Status: DC

## 2014-05-01 MED ORDER — OXYTOCIN 40 UNITS IN LACTATED RINGERS INFUSION - SIMPLE MED
62.5000 mL/h | INTRAVENOUS | Status: DC
  Administered 2014-05-01: 62.5 mL/h via INTRAVENOUS
  Filled 2014-05-01: qty 1000

## 2014-05-01 MED ORDER — LIDOCAINE HCL (PF) 1 % IJ SOLN
30.0000 mL | INTRAMUSCULAR | Status: DC | PRN
Start: 2014-05-01 — End: 2014-05-02
  Filled 2014-05-01: qty 30

## 2014-05-01 MED ORDER — TETANUS-DIPHTH-ACELL PERTUSSIS 5-2.5-18.5 LF-MCG/0.5 IM SUSP
0.5000 mL | Freq: Once | INTRAMUSCULAR | Status: DC
Start: 2014-05-02 — End: 2014-05-02

## 2014-05-01 MED ORDER — LIDOCAINE HCL (PF) 1 % IJ SOLN
INTRAMUSCULAR | Status: DC | PRN
  Administered 2014-05-01 (×2): 9 mL

## 2014-05-01 MED ORDER — WITCH HAZEL-GLYCERIN EX PADS: 1.0000 "application " | MEDICATED_PAD | CUTANEOUS | Status: DC | PRN

## 2014-05-01 MED ORDER — DIBUCAINE 1 % RE OINT: 1.0000 "application " | TOPICAL_OINTMENT | RECTAL | Status: DC | PRN

## 2014-05-01 MED ORDER — FENTANYL 2.5 MCG/ML BUPIVACAINE 1/10 % EPIDURAL INFUSION (WH - ANES)
14.0000 mL/h | INTRAMUSCULAR | Status: DC | PRN
  Administered 2014-05-01: 14 mL/h via EPIDURAL

## 2014-05-01 MED ORDER — EPHEDRINE 5 MG/ML INJ: 10.0000 mg | INTRAVENOUS | Status: DC | PRN

## 2014-05-01 MED ORDER — FLEET ENEMA 7-19 GM/118ML RE ENEM: 1.0000 | ENEMA | RECTAL | Status: DC | PRN

## 2014-05-01 MED ORDER — LACTATED RINGERS IV SOLN
500.0000 mL | Freq: Once | INTRAVENOUS | Status: AC
  Administered 2014-05-01: 500 mL via INTRAVENOUS

## 2014-05-01 MED ORDER — SIMETHICONE 80 MG PO CHEW: 80.0000 mg | CHEWABLE_TABLET | ORAL | Status: DC | PRN

## 2014-05-01 MED ORDER — FENTANYL 2.5 MCG/ML BUPIVACAINE 1/10 % EPIDURAL INFUSION (WH - ANES)
INTRAMUSCULAR | Status: DC | PRN
  Administered 2014-05-01: 14 mL/h via EPIDURAL

## 2014-05-01 MED ORDER — FENTANYL 2.5 MCG/ML BUPIVACAINE 1/10 % EPIDURAL INFUSION (WH - ANES)
INTRAMUSCULAR | Status: AC
  Filled 2014-05-01: qty 125

## 2014-05-01 MED ORDER — DIPHENHYDRAMINE HCL 25 MG PO CAPS: 25.0000 mg | ORAL_CAPSULE | Freq: Four times a day (QID) | ORAL | Status: DC | PRN

## 2014-05-01 MED ORDER — ONDANSETRON HCL 4 MG PO TABS: 4.0000 mg | ORAL_TABLET | ORAL | Status: DC | PRN

## 2014-05-01 MED ORDER — LACTATED RINGERS IV SOLN
INTRAVENOUS | Status: DC
  Administered 2014-05-01: 125 mL/h via INTRAVENOUS

## 2014-05-01 MED ORDER — PHENYLEPHRINE 40 MCG/ML (10ML) SYRINGE FOR IV PUSH (FOR BLOOD PRESSURE SUPPORT)
PREFILLED_SYRINGE | INTRAVENOUS | Status: AC
  Filled 2014-05-01: qty 10

## 2014-05-01 MED ORDER — OXYCODONE-ACETAMINOPHEN 5-325 MG PO TABS: 1.0000 | ORAL_TABLET | ORAL | Status: DC | PRN

## 2014-05-01 MED ORDER — LIDOCAINE HCL (PF) 1 % IJ SOLN: 30.0000 mL | INTRAMUSCULAR | Status: DC | PRN

## 2014-05-01 MED ORDER — DIPHENHYDRAMINE HCL 50 MG/ML IJ SOLN: 12.5000 mg | INTRAMUSCULAR | Status: DC | PRN

## 2014-05-01 MED ORDER — IBUPROFEN 600 MG PO TABS: 600.0000 mg | ORAL_TABLET | Freq: Four times a day (QID) | ORAL | Status: DC | PRN

## 2014-05-01 MED ORDER — LANOLIN HYDROUS EX OINT: TOPICAL_OINTMENT | CUTANEOUS | Status: DC | PRN

## 2014-05-01 MED ORDER — ONDANSETRON HCL 4 MG/2ML IJ SOLN: 4.0000 mg | INTRAMUSCULAR | Status: DC | PRN

## 2014-05-01 MED ORDER — ACETAMINOPHEN 325 MG PO TABS: 650.0000 mg | ORAL_TABLET | ORAL | Status: DC | PRN

## 2014-05-01 MED ORDER — CITRIC ACID-SODIUM CITRATE 334-500 MG/5ML PO SOLN
30.0000 mL | ORAL | Status: DC | PRN
Start: 2014-05-01 — End: 2014-05-02

## 2014-05-01 MED ORDER — LACTATED RINGERS IV SOLN: INTRAVENOUS | Status: DC

## 2014-05-01 MED ORDER — PHENYLEPHRINE 40 MCG/ML (10ML) SYRINGE FOR IV PUSH (FOR BLOOD PRESSURE SUPPORT)
80.0000 ug | PREFILLED_SYRINGE | INTRAVENOUS | Status: DC | PRN
  Administered 2014-05-01 (×2): 80 ug via INTRAVENOUS
  Filled 2014-05-01: qty 2

## 2014-05-01 MED ORDER — BENZOCAINE-MENTHOL 20-0.5 % EX AERO
1.0000 "application " | INHALATION_SPRAY | CUTANEOUS | Status: DC | PRN
  Administered 2014-05-01: 1 via TOPICAL
  Filled 2014-05-01: qty 56

## 2014-05-01 NOTE — Anesthesia Procedure Notes (Signed)
Epidural Patient location during procedure: OB Start time: 05/01/2014 8:43 AM End time: 05/01/2014 8:47 AM  Staffing Anesthesiologist: Leilani Able Performed by: anesthesiologist   Preanesthetic Checklist Completed: patient identified, surgical consent, pre-op evaluation, timeout performed, IV checked, risks and benefits discussed and monitors and equipment checked  Epidural Patient position: sitting Prep: site prepped and draped and DuraPrep Patient monitoring: continuous pulse ox and blood pressure Approach: midline Location: L3-L4 Injection technique: LOR air  Needle:  Needle type: Tuohy  Needle gauge: 17 G Needle length: 9 cm and 9 Needle insertion depth: 6 cm Catheter type: closed end flexible Catheter size: 19 Gauge Catheter at skin depth: 11 cm Test dose: negative and Other  Assessment Sensory level: T10 Events: blood not aspirated, injection not painful, no injection resistance, negative IV test and no paresthesia  Additional Notes Reason for block:procedure for pain

## 2014-05-01 NOTE — Anesthesia Preprocedure Evaluation (Signed)

## 2014-05-01 NOTE — H&P (Signed)
LABOR ADMISSION HISTORY AND PHYSICAL  Kristen Watkins is a 28 y.o. female G2P1001 with IUP at [redacted]w[redacted]d by LMP cw 27w sono presenting in active labor. She reports +FMs, No LOF, no VB, no blurry vision, headaches or peripheral edema, and RUQ pain. She desires an epidural for labor pain control. She plans on breast and bottle feeding. She declines any birth control.  Dating: By LMP cw 27w sono --->  Estimated Date of Delivery: 04/29/14  Sono:     Prenatal History/Complications:  Past Medical History: Past Medical History  Diagnosis Date  . Medical history non-contributory     Past Surgical History: Past Surgical History  Procedure Laterality Date  . No past surgeries      Obstetrical History: OB History   Grav Para Term Preterm Abortions TAB SAB Ect Mult Living   2 1 1       1       Social History: History   Social History  . Marital Status: Married    Spouse Name: N/A    Number of Children: N/A  . Years of Education: N/A   Social History Main Topics  . Smoking status: Never Smoker   . Smokeless tobacco: None  . Alcohol Use: No  . Drug Use: No  . Sexual Activity: Not Currently    Birth Control/ Protection: None   Other Topics Concern  . None   Social History Narrative  . None    Family History: Family History  Problem Relation Age of Onset  . Alcohol abuse Neg Hx   . Arthritis Neg Hx   . Asthma Neg Hx   . Birth defects Neg Hx   . Cancer Neg Hx   . COPD Neg Hx   . Depression Neg Hx   . Diabetes Neg Hx   . Drug abuse Neg Hx   . Early death Neg Hx   . Heart disease Neg Hx   . Hearing loss Neg Hx   . Hyperlipidemia Neg Hx   . Hypertension Neg Hx   . Kidney disease Neg Hx   . Learning disabilities Neg Hx   . Mental illness Neg Hx   . Mental retardation Neg Hx   . Miscarriages / Stillbirths Neg Hx   . Stroke Neg Hx   . Vision loss Neg Hx   . Varicose Veins Neg Hx     Allergies: No Known Allergies  Prescriptions prior to admission  Medication Sig  Dispense Refill  . prenatal vitamin w/FE, FA (NATACHEW) 29-1 MG CHEW chewable tablet Chew 1 tablet by mouth daily at 12 noon.  30 tablet  3     Review of Systems   All systems reviewed and negative except as stated in HPI  Blood pressure 133/78, pulse 82, temperature 97.9 F (36.6 C), temperature source Oral, resp. rate 22, last menstrual period 07/23/2013. General appearance: alert, cooperative and moderate distress Lungs: clear to auscultation bilaterally Heart: regular rate and rhythm Abdomen: soft, non-tender; bowel sounds normal Pelvic: not examined Extremities: Homans sign is negative, no sign of DVT DTR's not performed Presentation: cephalic Fetal monitoringBaseline: 140 bpm, Variability: Good {> 6 bpm), Accelerations: Reactive and Decelerations: Absent Uterine activityq1min   Dilation: 8 Exam by:: dee carter   Prenatal labs: ABO, Rh: O/POS/-- (06/03 1023) Antibody: NEG (06/03 1023) Rubella:   RPR: NON REAC (06/03 1023)  HBsAg: NEGATIVE (06/03 1023)  HIV: NONREACTIVE (06/03 1023)  GBS:    1 hr Glucola too late Genetic screening  Too late Anatomy  US normal   Prenatal Transfer Tool  MaternKoreaal Diabetes: No Genetic Screening: Declined Maternal Ultrasounds/Referrals: Normal Fetal Ultrasounds or other Referrals:  None Maternal Substance Abuse:  No Significant Maternal Medications:  None Significant Maternal Lab Results: None     Results for orders placed during the hospital encounter of 05/01/14 (from the past 24 hour(s))  CBC   Collection Time    05/01/14  8:20 AM      Result Value Ref Range   WBC 7.7  4.0 - 10.5 K/uL   RBC 4.65  3.87 - 5.11 MIL/uL   Hemoglobin 13.3  12.0 - 15.0 g/dL   HCT 16.140.1  09.636.0 - 04.546.0 %   MCV 86.2  78.0 - 100.0 fL   MCH 28.6  26.0 - 34.0 pg   MCHC 33.2  30.0 - 36.0 g/dL   RDW 40.914.7  81.111.5 - 91.415.5 %   Platelets 276  150 - 400 K/uL    Patient Active Problem List   Diagnosis Date Noted  . Active labor 05/01/2014  . UTI (lower  urinary tract infection) 03/01/2014  . Late prenatal care complicating pregnancy in third trimester 02/21/2014    Assessment: Kristen Watkins is a 28 y.o. G2P1001 at 6141w2d in active labor  #Labor: progressing normally #Pain: Epidural #FWB: Cat I #ID:  No GBS #MOF: br and bottle #MOC:declines #Circ:  N/a, female  Kristen Watkins 05/01/2014, 8:42 AM

## 2014-05-01 NOTE — Lactation Note (Signed)
This note was copied from the chart of Kristen Brigida Darwish. Lactation Consultation Note Experienced BF mom who BF her 3 yr. Old son for 2 yrs. Mom was in bed BF in laid back position. Assisted up slightly d/t mom pulling breast tissue back and baby wrapped in multiple blankets. Encouraged mom to unwrap baby and BF STS. Baby latched well, just needed to be closer and use pillows for positioning for comfort. Arabic, states speaks good [place] as well as reads good [place]English dening need for interpreter. Mom encouraged to feed baby 8-12 times/24 hours and with feeding cues. Mom reports + breast changes w/pregnancy. WH/LC brochure given w/resources, support groups and LC services. Educated about newborn behavior. Encouraged to call for assistance if needed and to verify proper latch.Referred to Baby and Me Book in Breastfeeding section Pg. 22-23 for position options and Proper latch demonstration. Mom encouraged to waken baby for feeds if hasn't fed in 3 hrs. Patient Name: Kristen Josefine ClassSamah Bodkin Watkins's Date: 05/01/2014 Reason for consult: Initial assessment   Maternal Data    Feeding Feeding Type: Breast Fed Length of feed: 20 min  LATCH Score/Interventions Latch: Grasps breast easily, tongue down, lips flanged, rhythmical sucking. Intervention(s): Adjust position;Assist with latch  Audible Swallowing: A few with stimulation Intervention(s): Hand expression  Type of Nipple: Everted at rest and after stimulation  Comfort (Breast/Nipple): Soft / non-tender     Hold (Positioning): Assistance needed to correctly position infant at breast and maintain latch. Intervention(s): Breastfeeding basics reviewed;Support Pillows;Position options;Skin to skin  LATCH Score: 8  Lactation Tools Discussed/Used     Consult Status Consult Status: Follow-up Date: 05/02/14 Follow-up type: In-patient    Charyl DancerCARVER, Kynlea Blackston G 05/01/2014, 8:48 PM

## 2014-05-01 NOTE — H&P (Signed)
Attestation of Attending Supervision of Obstetric Fellow: Evaluation and management procedures were performed by the Obstetric Fellow under my supervision and collaboration.  I have reviewed the Obstetric Fellow's note and chart, and I agree with the management and plan.  Johndavid Geralds, MD, FACOG Attending Obstetrician & Gynecologist Faculty Practice, [facility]   

## 2014-05-01 NOTE — Progress Notes (Signed)
LABOR PROGRESS NOTE  Josefine ClassSamah Winberry is a 28 y.o. G2P1001 at 5867w2d  admitted for active labor  Subjective: Comfortable with epidural, feeling some pressure  Objective: BP 102/61  Pulse 90  Temp(Src) 98.6 F (37 C) (Oral)  Resp 16  Ht 5\' 6"  (1.676 m)  Wt 90.719 kg (200 lb)  BMI 32.30 kg/m2  SpO2 98%  LMP 07/23/2013 or  Filed Vitals:   05/01/14 1031 05/01/14 1101 05/01/14 1132 05/01/14 1201  BP: 105/64 120/74 110/64 102/61  Pulse: 76 82 88 90  Temp:      TempSrc:      Resp:      Height:      Weight:      SpO2:           FHT:  FHR: 140 bpm, variability: moderate,  accelerations:  Present,  decelerations:  Absent UC:   q2-653min SVE:   Dilation: 9 Station: -2 Exam by:: Loreta AveAcosta  Dilation: 9 Station: -2 Presentation: Vertex Exam by:: [employer]  Labs: Lab Results  Component Value Date   WBC 7.7 05/01/2014   HGB 13.3 05/01/2014   HCT 40.1 05/01/2014   MCV 86.2 05/01/2014   PLT 276 05/01/2014    Assessment / Plan: Spontaneous labor, progressing normally  Labor: Progressing normally, AROM for augmentation Fetal Wellbeing:  Category I Pain Control:  Epidural Anticipated MOD:  NSVD  Farhaan Mabee ROCIO, MD 05/01/2014, 12:25 PM

## 2014-05-01 NOTE — MAU Note (Signed)
Pt states uc's started @ 0500 this morning, denies any bleeding or LOF.

## 2014-05-02 LAB — CBC
HCT: 36.7 % (ref 36.0–46.0)
Hemoglobin: 12.1 g/dL (ref 12.0–15.0)
MCH: 28.7 pg (ref 26.0–34.0)
MCHC: 33 g/dL (ref 30.0–36.0)
MCV: 87.2 fL (ref 78.0–100.0)
PLATELETS: 253 10*3/uL (ref 150–400)
RBC: 4.21 MIL/uL (ref 3.87–5.11)
RDW: 14.8 % (ref 11.5–15.5)
WBC: 13.4 10*3/uL — AB (ref 4.0–10.5)

## 2014-05-02 MED ORDER — IBUPROFEN 600 MG PO TABS: 600.0000 mg | ORAL_TABLET | Freq: Four times a day (QID) | ORAL | Status: DC

## 2014-05-02 NOTE — Anesthesia Postprocedure Evaluation (Signed)
Anesthesia Post Note  Patient: Kristen Watkins  Procedure(s) Performed: * No procedures listed *  Anesthesia type: Epidural  Patient location: Mother/Baby  Post pain: Pain level controlled  Post assessment: Post-op Vital signs reviewed  Last Vitals: There were no vitals filed for this visit.  Post vital signs: Reviewed  Level of consciousness: awake  Complications: No apparent anesthesia complications

## 2014-05-02 NOTE — Discharge Summary (Signed)
Attestation of Attending Supervision of Obstetric Fellow: Evaluation and management procedures were performed by the Obstetric Fellow under my supervision and collaboration.  I have reviewed the Obstetric Fellow's note and chart, and I agree with the management and plan.  Ralphine Hinks, MD, FACOG Attending Obstetrician & Gynecologist Faculty Practice, [facility]   

## 2014-05-02 NOTE — Discharge Instructions (Signed)

## 2014-05-02 NOTE — Progress Notes (Signed)
Clinical Social Work Department PSYCHOSOCIAL ASSESSMENT - MATERNAL/CHILD 05/02/2014  Patient:  Kristen Watkins,Kristen Watkins  Account Number:  [id number]  Admit Date:  05/01/2014  Childs Name:   Kristen Watkins    Clinical Social Worker:  Mychael Smock, CLINICAL SOCIAL WORKER   Date/Time:  05/02/2014 10:15 AM  Date Referred:  05/01/2014   Referral source  Central Nursery     Referred reason  LPNC   Other referral source:    I:  FAMILY / HOME ENVIRONMENT Child's legal guardian:  PARENT  Guardian - Name Guardian - Age Guardian - Address  Andreana Harkey 28 [address], [place], [place] [address]   Other household support members/support persons Name Relationship DOB   MOTHER    BROTHER    BROTHER    SISTER    SON 3 years old   Other support:   MOB shared that her husband lives in [place], and that she lives in [place] with the above reported family members. She shared that she has no other family members in town, but discussed that her family is very supportive and helpful.    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   Financial resources:  Medicaid If Medicaid - County:  [place] Other  Food Stamps  WIC   School / Grade:  N/A Maternity Care Coordinator / Child Services Coordination / Early Interventions:   N/A  Cultural issues impacting care:   MOB is from [place], and shared that she visits one time a year.  She stated that due to being in [place] during beginning of pregnancy, she had limited PNC.    III  STRENGTHS Strengths  Adequate Resources  Home prepared for Child (including basic supplies)  Supportive family/friends   Strength comment:    IV  RISK FACTORS AND CURRENT PROBLEMS Current Problem:  YES   Risk Factor & Current Problem Patient Issue Family Issue Risk Factor / Current Problem Comment  Other - See comment Y N MOB presented  for first prenatal visit at 30 weeks.    V  SOCIAL WORK ASSESSMENT CSW met with MOB in her room to complete the assessment. Consult ordered due to LPNC as MOB presented for first prenatal visit at 30 weeks.  MOB receptive to completing assessment, was pleasant, easily engaged; however, was guarded when asked to talk about her feelings.  MOB presented with appropriate mood and affect.   Per MOB, she has lived in the [place], and that she currently lives with her mother, 3 siblings, and her 3 year old son.  She shared that her husband currently lives in [place], and that she visits him one time per year.  She expressed hope that he will be able to move to the [place].  CSW inquired about MOB coping with their separation, and explored with MOB her feelings about giving birth without him being present.  MOB did not discuss in great detail her feelings, but shared that she is often sad and does acknowledge crying occasionally since she misses him and wants him to be present.  MOB shared that her supportive assist her with as she misses him.  CSW validated MOB's feelings and normalized the stressors that accompany physical distance between spouses.    CSW inquired about mental health history.  MOB denied previous mental health problems and denied previous postpartum depression.  MOB receptive   to education on signs and symptoms, and is agreeable to notifying her family and medical providers if she experiences symptoms.    CSW inquired about LPNC.  MOB shared that she was in [place] when she first learned that she was pregnant, and that she did not seek care as soon as she returned to the [place] due to her pending Medicaid.  She also shared belief that since she already had a child, she knew what to expect during her pregnancy.  CSW emphasized the importance of prenatal care and inquired about thoughts and feelings associated with follow-up appointments.  MOB shared that she has  identified a pediatrician, and reported awareness of the importance of attending follow-up appointments.  CSW provided education on drug screen policy, and MOB verbalized understanding of policy.  MOB denied any substance abuse history, and denied concerns related to meconium drug screen.   MOB aware of CSW availability.  No barriers to discharge noted at this time.     VI SOCIAL WORK PLAN Social Work Plan  Patient/Family Education  No Further Intervention Required / No Barriers to Discharge   Type of pt/family education:   Postpartum depression and anxiety Drug screen policy   If child protective services report - county:   If child protective services report - date:   Information/referral to community resources comment:   N/A   Other social work plan:   Ongoing emotional suppot as needed.     

## 2014-05-02 NOTE — Progress Notes (Signed)
UR chart review completed.  

## 2014-05-02 NOTE — Discharge Summary (Signed)
Obstetric Discharge Summary Reason for Admission: onset of labor Prenatal Procedures: none Intrapartum Procedures: spontaneous vaginal delivery Postpartum Procedures: none Complications-Operative and Postpartum: 1st degree perineal laceration  Delivery Note At 1:44 PM a viable female was delivered via Vaginal, Spontaneous Delivery (Presentation: ;  ).  APGAR: 6, 9; weight 8 lb 4.2 oz (3748 g).   Placenta status: Intact, Spontaneous.  Cord: 3 vessels with the following complications: None.  Anesthesia: Epidural  Episiotomy: None Lacerations: 1st degree Suture Repair: 3.0 vicryl rapide Est. Blood Loss (mL): 300  Mom to postpartum.  Baby to Couplet care / Skin to Skin (initially to warmer for suctioning)  Kristen Watkins Kristen Watkins 05/02/2014, 9:14 AM     Hospital Course:  Active Problems:   Active labor   Today: No acute events overnight.  Pt denies problems with ambulating, voiding or po intake.  She denies nausea or vomiting.  Pain is well controlled.  She has had flatus.   Lochia Moderate.  Plan for birth control is  considering nexplanon.  Method of Feeding: breast and bottle  Kristen Watkins is a 28 y.o. [id number] s/p SVD.  Patient presented to OBT contractions in active labor and was admitted to L&D.  She has postpartum course that was uncomplicated including no problems with ambulating, PO intake, urination, pain, or bleeding. The pt feels ready to go home and  will be discharged with outpatient follow-up.    H/H: Lab Results  Component Value Date/Time   HGB 12.1 05/02/2014  5:35 AM   HCT 36.7 05/02/2014  5:35 AM    Discharge Diagnoses: Term Pregnancy-delivered  Discharge Information: Date: @TODAY @ Activity: pelvic rest Diet: routine  Medications: Ibuprofen and Colace Breast feeding:  Yes Condition: stable Instructions: refer to handout Discharge to: home      Medication List         prenatal vitamin w/FE, FA 29-1 MG Chew chewable tablet  Chew 1 tablet by mouth  daily at 12 noon.         Kristen Watkins,Kristen Watkins ,MD OB Fellow 05/02/2014,9:14 AM

## 2014-05-03 ENCOUNTER — Ambulatory Visit: Payer: Self-pay

## 2014-05-03 NOTE — Lactation Note (Signed)
This note was copied from the chart of Kristen Watkins. Lactation Consultation Note  Mother states she does not have enough breastmilk since the baby was wanting to breatfeed often the first night. Explained stomach size, cluster feeding and supply and demand. Encouraged mother to breatfeed first, then supplement.  Provided volume guidelines and a hand pump. Reviewed engorgement care and suggested she call if she has questions.  Patient Name: Kristen Watkins's Date: 05/03/2014 Reason for consult: Follow-up assessment   Maternal Data    Feeding Feeding Type: Bottle Fed - Formula  LATCH Score/Interventions Latch: Grasps breast easily, tongue down, lips flanged, rhythmical sucking.  Audible Swallowing: A few with stimulation  Type of Nipple: Everted at rest and after stimulation  Comfort (Breast/Nipple): Soft / non-tender     Hold (Positioning): Assistance needed to correctly position infant at breast and maintain latch. (Mother does not show response to advise for deeper latch)  LATCH Score: 8  Lactation Tools Discussed/Used     Consult Status Consult Status: Complete    Hardie PulleyBerkelhammer, Ruth Boschen 05/03/2014, 10:10 AM

## 2014-05-07 ENCOUNTER — Encounter (HOSPITAL_COMMUNITY): Payer: Self-pay

## 2014-05-07 ENCOUNTER — Inpatient Hospital Stay (HOSPITAL_COMMUNITY)
Admission: AD | Admit: 2014-05-07 | Discharge: 2014-05-07 | Disposition: A | Discharge status: Home / Self Care | Payer: Medicaid Other | Source: Ambulatory Visit | Attending: Obstetrics & Gynecology | Admitting: Obstetrics & Gynecology

## 2014-05-07 DIAGNOSIS — O99893 Other specified diseases and conditions complicating puerperium: Secondary | ICD-10-CM | POA: Diagnosis not present

## 2014-05-07 DIAGNOSIS — O9989 Other specified diseases and conditions complicating pregnancy, childbirth and the puerperium: Principal | ICD-10-CM

## 2014-05-07 DIAGNOSIS — N949 Unspecified condition associated with female genital organs and menstrual cycle: Secondary | ICD-10-CM | POA: Insufficient documentation

## 2014-05-07 NOTE — Discharge Instructions (Signed)
Contraception Choices Contraception (birth control) is the use of any methods or devices to prevent pregnancy. Below are some methods to help avoid pregnancy. HORMONAL METHODS   Contraceptive implant. This is a thin, plastic tube containing progesterone hormone. It does not contain estrogen hormone. Your health care provider inserts the tube in the inner part of the upper arm. The tube can remain in place for up to 3 years. After 3 years, the implant must be removed. The implant prevents the ovaries from releasing an egg (ovulation), thickens the cervical mucus to prevent sperm from entering the uterus, and thins the lining of the inside of the uterus.  Progesterone-only injections. These injections are given every 3 months by your health care provider to prevent pregnancy. This synthetic progesterone hormone stops the ovaries from releasing eggs. It also thickens cervical mucus and changes the uterine lining. This makes it harder for sperm to survive in the uterus.  Birth control pills. These pills contain estrogen and progesterone hormone. They work by preventing the ovaries from releasing eggs (ovulation). They also cause the cervical mucus to thicken, preventing the sperm from entering the uterus. Birth control pills are prescribed by a health care provider.Birth control pills can also be used to treat heavy periods.  Minipill. This type of birth control pill contains only the progesterone hormone. They are taken every day of each month and must be prescribed by your health care provider.  Birth control patch. The patch contains hormones similar to those in birth control pills. It must be changed once a week and is prescribed by a health care provider.  Vaginal ring. The ring contains hormones similar to those in birth control pills. It is left in the vagina for 3 weeks, removed for 1 week, and then a new one is put back in place. The patient must be comfortable inserting and removing the ring  from the vagina.A health care provider's prescription is necessary.  Emergency contraception. Emergency contraceptives prevent pregnancy after unprotected sexual intercourse. This pill can be taken right after sex or up to 5 days after unprotected sex. It is most effective the sooner you take the pills after having sexual intercourse. Most emergency contraceptive pills are available without a prescription. Check with your pharmacist. Do not use emergency contraception as your only form of birth control. BARRIER METHODS   Female condom. This is a thin sheath (latex or rubber) that is worn over the penis during sexual intercourse. It can be used with spermicide to increase effectiveness.  Female condom. This is a soft, loose-fitting sheath that is put into the vagina before sexual intercourse.  Diaphragm. This is a soft, latex, dome-shaped barrier that must be fitted by a health care provider. It is inserted into the vagina, along with a spermicidal jelly. It is inserted before intercourse. The diaphragm should be left in the vagina for 6 to 8 hours after intercourse.  Cervical cap. This is a round, soft, latex or plastic cup that fits over the cervix and must be fitted by a health care provider. The cap can be left in place for up to 48 hours after intercourse.  Sponge. This is a soft, circular piece of polyurethane foam. The sponge has spermicide in it. It is inserted into the vagina after wetting it and before sexual intercourse.  Spermicides. These are chemicals that kill or block sperm from entering the cervix and uterus. They come in the form of creams, jellies, suppositories, foam, or tablets. They do not require a   prescription. They are inserted into the vagina with an applicator before having sexual intercourse. The process must be repeated every time you have sexual intercourse. INTRAUTERINE CONTRACEPTION  Intrauterine device (IUD). This is a T-shaped device that is put in a woman's uterus  during a menstrual period to prevent pregnancy. There are 2 types:  Copper IUD. This type of IUD is wrapped in copper wire and is placed inside the uterus. Copper makes the uterus and fallopian tubes produce a fluid that kills sperm. It can stay in place for 10 years.  Hormone IUD. This type of IUD contains the hormone progestin (synthetic progesterone). The hormone thickens the cervical mucus and prevents sperm from entering the uterus, and it also thins the uterine lining to prevent implantation of a fertilized egg. The hormone can weaken or kill the sperm that get into the uterus. It can stay in place for 3-5 years, depending on which type of IUD is used. PERMANENT METHODS OF CONTRACEPTION  Female tubal ligation. This is when the woman's fallopian tubes are surgically sealed, tied, or blocked to prevent the egg from traveling to the uterus.  Hysteroscopic sterilization. This involves placing a small coil or insert into each fallopian tube. Your doctor uses a technique called hysteroscopy to do the procedure. The device causes scar tissue to form. This results in permanent blockage of the fallopian tubes, so the sperm cannot fertilize the egg. It takes about 3 months after the procedure for the tubes to become blocked. You must use another form of birth control for these 3 months.  Female sterilization. This is when the female has the tubes that carry sperm tied off (vasectomy).This blocks sperm from entering the vagina during sexual intercourse. After the procedure, the man can still ejaculate fluid (semen). NATURAL PLANNING METHODS  Natural family planning. This is not having sexual intercourse or using a barrier method (condom, diaphragm, cervical cap) on days the woman could become pregnant.  Calendar method. This is keeping track of the length of each menstrual cycle and identifying when you are fertile.  Ovulation method. This is avoiding sexual intercourse during ovulation.  Symptothermal  method. This is avoiding sexual intercourse during ovulation, using a thermometer and ovulation symptoms.  Post-ovulation method. This is timing sexual intercourse after you have ovulated. Regardless of which type or method of contraception you choose, it is important that you use condoms to protect against the transmission of sexually transmitted infections (STIs). Talk with your health care provider about which form of contraception is most appropriate for you. Document Released: 09/07/2005 Document Revised: 09/12/2013 Document Reviewed: 03/02/2013 [employer] Patient Information 2015 [employer]. This information is not intended to replace advice given to you by your health care provider. Make sure you discuss any questions you have with your health care provider.  

## 2014-05-07 NOTE — MAU Note (Signed)
Pt had vaginal delivery August 11th.

## 2014-05-07 NOTE — MAU Provider Note (Signed)
CC: Vaginal Pain     HPI Kristen Watkins is a 28 y.o. [id number] 5d s/p NSVD who presents with concern that she has pain at site of vaginal repair since delivery and feels a stitch poking. States she saw sutures on her peripad when she went home. Lochia teapering. Breastfeeding without difficulty. Plans calendar method of BC.  Has HH visit tomorrow.   Past Medical History  Diagnosis Date  . Medical history non-contributory     OB History  Gravida Para Term Preterm AB SAB TAB Ectopic Multiple Living  2 2 2       2     # Outcome Date GA Lbr Len/2nd Weight Sex Delivery Anes PTL Lv  2 TRM 05/01/14 [id number] 08:55 / 00:49 3.748 kg (8 lb 4.2 oz) F SVD EPI  Y  1 TRM 03/21/11    M SVD EPI  Y      Past Surgical History  Procedure Laterality Date  . No past surgeries      History   Social History  . Marital Status: Married    Spouse Name: N/A    Number of Children: N/A  . Years of Education: N/A   Occupational History  . Not on file.   Social History Main Topics  . Smoking status: Never Smoker   . Smokeless tobacco: Never Used  . Alcohol Use: No  . Drug Use: No  . Sexual Activity: Not Currently    Birth Control/ Protection: None   Other Topics Concern  . Not on file   Social History Narrative  . No narrative on file    No current facility-administered medications on file prior to encounter.   Current Outpatient Prescriptions on File Prior to Encounter  Medication Sig Dispense Refill  . ibuprofen (ADVIL,MOTRIN) 600 MG tablet Take 1 tablet (600 mg total) by mouth every 6 (six) hours.  30 tablet  0  . prenatal vitamin w/FE, FA (NATACHEW) 29-1 MG CHEW chewable tablet Chew 1 tablet by mouth daily at 12 noon.  30 tablet  3    No Known Allergies  ROS Pertinent items in HPI  PHYSICAL EXAM Filed Vitals:   05/07/14 1621  BP: 130/78  Pulse: 70  Temp: 98.8 F (37.1 C)  Resp: 16   General: Well nourished, well developed female in no acute distress Cardiovascular: Normal  rate Respiratory: Normal effort Abdomen: Soft, nontender, well involuted Neurologic: Alert and oriented Speculum exam: 2.5 cm tail of suture at introitus clipped. Some suture intact at introitus. No evidence of infection. Lochia scant.   ASSESSMENT  1. Postpartum care following vaginal delivery   Trailing suture at introitus  PLAN Discharge home with reassurance. Continue peri-care, anesthetic spray. See AVS for patient education.    Medication List         ibuprofen 600 MG tablet  Commonly known as:  ADVIL,MOTRIN  Take 1 tablet (600 mg total) by mouth every 6 (six) hours.     prenatal vitamin w/FE, FA 29-1 MG Chew chewable tablet  Chew 1 tablet by mouth daily at 12 noon.       Follow-up Information   Follow up with [facility] On 05/31/2014.   Contact information:   [address] [place] [id number] [phone]        Danae OrleansDeirdre C Emilynn Srinivasan, CNM 05/07/2014 4:46 PM

## 2014-05-07 NOTE — MAU Note (Signed)
Patient states she had a vaginal delivery on 8-10 and is having vaginal pain from the stitches. States she has had a lot of swelling but is better today. Feels like stitches are coming out.

## 2014-06-07 ENCOUNTER — Ambulatory Visit (INDEPENDENT_AMBULATORY_CARE_PROVIDER_SITE_OTHER): Payer: Medicaid Other | Admitting: Physician Assistant

## 2014-06-07 ENCOUNTER — Encounter: Payer: Self-pay | Admitting: Physician Assistant

## 2014-06-07 VITALS — BP 117/69 | HR 57 | Temp 98.2°F | Ht 64.0 in | Wt 182.6 lb

## 2014-06-07 DIAGNOSIS — Z30430 Encounter for insertion of intrauterine contraceptive device: Secondary | ICD-10-CM

## 2014-06-07 DIAGNOSIS — Z30017 Encounter for initial prescription of implantable subdermal contraceptive: Secondary | ICD-10-CM

## 2014-06-07 DIAGNOSIS — N63 Unspecified lump in unspecified breast: Secondary | ICD-10-CM

## 2014-06-07 DIAGNOSIS — Z01812 Encounter for preprocedural laboratory examination: Secondary | ICD-10-CM

## 2014-06-07 LAB — POCT PREGNANCY, URINE: Preg Test, Ur: NEGATIVE

## 2014-06-07 MED ORDER — ETONOGESTREL 68 MG ~~LOC~~ IMPL
68.0000 mg | DRUG_IMPLANT | Freq: Once | SUBCUTANEOUS | Status: AC
  Administered 2014-06-07: 68 mg via SUBCUTANEOUS

## 2014-06-07 MED ORDER — ETONOGESTREL 68 MG ~~LOC~~ IMPL: 68.0000 mg | DRUG_IMPLANT | Freq: Once | SUBCUTANEOUS | Status: DC

## 2014-06-07 NOTE — Patient Instructions (Signed)
Breastfeeding Deciding to breastfeed is one of the best choices you can make for you and your baby. A change in hormones during pregnancy causes your breast tissue to grow and increases the number and size of your milk ducts. These hormones also allow proteins, sugars, and fats from your blood supply to make breast milk in your milk-producing glands. Hormones prevent breast milk from being released before your baby is born as well as prompt milk flow after birth. Once breastfeeding has begun, thoughts of your baby, as well as his or her sucking or crying, can stimulate the release of milk from your milk-producing glands.  BENEFITS OF BREASTFEEDING For Your Baby  Your first milk (colostrum) helps your baby's digestive system function better.   There are antibodies in your milk that help your baby fight off infections.   Your baby has a lower incidence of asthma, allergies, and sudden infant death syndrome.   The nutrients in breast milk are better for your baby than infant formulas and are designed uniquely for your baby's needs.   Breast milk improves your baby's brain development.   Your baby is less likely to develop other conditions, such as childhood obesity, asthma, or type 2 diabetes mellitus.  For You   Breastfeeding helps to create a very special bond between you and your baby.   Breastfeeding is convenient. Breast milk is always available at the correct temperature and costs nothing.   Breastfeeding helps to burn calories and helps you lose the weight gained during pregnancy.   Breastfeeding makes your uterus contract to its prepregnancy size faster and slows bleeding (lochia) after you give birth.   Breastfeeding helps to lower your risk of developing type 2 diabetes mellitus, osteoporosis, and breast or ovarian cancer later in life. SIGNS THAT YOUR BABY IS HUNGRY Early Signs of Hunger  Increased alertness or activity.  Stretching.  Movement of the head from  side to side.  Movement of the head and opening of the mouth when the corner of the mouth or cheek is stroked (rooting).  Increased sucking sounds, smacking lips, cooing, sighing, or squeaking.  Hand-to-mouth movements.  Increased sucking of fingers or hands. Late Signs of Hunger  Fussing.  Intermittent crying. Extreme Signs of Hunger Signs of extreme hunger will require calming and consoling before your baby will be able to breastfeed successfully. Do not wait for the following signs of extreme hunger to occur before you initiate breastfeeding:   Restlessness.  A loud, strong cry.   Screaming. BREASTFEEDING BASICS Breastfeeding Initiation  Find a comfortable place to sit or lie down, with your neck and back well supported.  Place a pillow or rolled up blanket under your baby to bring him or her to the level of your breast (if you are seated). Nursing pillows are specially designed to help support your arms and your baby while you breastfeed.  Make sure that your baby's abdomen is facing your abdomen.   Gently massage your breast. With your fingertips, massage from your chest wall toward your nipple in a circular motion. This encourages milk flow. You may need to continue this action during the feeding if your milk flows slowly.  Support your breast with 4 fingers underneath and your thumb above your nipple. Make sure your fingers are well away from your nipple and your baby's mouth.   Stroke your baby's lips gently with your finger or nipple.   When your baby's mouth is open wide enough, quickly bring your baby to your   breast, placing your entire nipple and as much of the colored area around your nipple (areola) as possible into your baby's mouth.   More areola should be visible above your baby's upper lip than below the lower lip.   Your baby's tongue should be between his or her lower gum and your breast.   Ensure that your baby's mouth is correctly positioned  around your nipple (latched). Your baby's lips should create a seal on your breast and be turned out (everted).  It is common for your baby to suck about 2-3 minutes in order to start the flow of breast milk. Latching Teaching your baby how to latch on to your breast properly is very important. An improper latch can cause nipple pain and decreased milk supply for you and poor weight gain in your baby. Also, if your baby is not latched onto your nipple properly, he or she may swallow some air during feeding. This can make your baby fussy. Burping your baby when you switch breasts during the feeding can help to get rid of the air. However, teaching your baby to latch on properly is still the best way to prevent fussiness from swallowing air while breastfeeding. Signs that your baby has successfully latched on to your nipple:    Silent tugging or silent sucking, without causing you pain.   Swallowing heard between every 3-4 sucks.    Muscle movement above and in front of his or her ears while sucking.  Signs that your baby has not successfully latched on to nipple:   Sucking sounds or smacking sounds from your baby while breastfeeding.  Nipple pain. If you think your baby has not latched on correctly, slip your finger into the corner of your baby's mouth to break the suction and place it between your baby's gums. Attempt breastfeeding initiation again. Signs of Successful Breastfeeding Signs from your baby:   A gradual decrease in the number of sucks or complete cessation of sucking.   Falling asleep.   Relaxation of his or her body.   Retention of a small amount of milk in his or her mouth.   Letting go of your breast by himself or herself. Signs from you:  Breasts that have increased in firmness, weight, and size 1-3 hours after feeding.   Breasts that are softer immediately after breastfeeding.  Increased milk volume, as well as a change in milk consistency and color by  the fifth day of breastfeeding.   Nipples that are not sore, cracked, or bleeding. Signs That Your Baby is Getting Enough Milk  Wetting at least 3 diapers in a 24-hour period. The urine should be clear and pale yellow by age 5 days.  At least 3 stools in a 24-hour period by age 5 days. The stool should be soft and yellow.  At least 3 stools in a 24-hour period by age 7 days. The stool should be seedy and yellow.  No loss of weight greater than 10% of birth weight during the first 3 days of age.  Average weight gain of 4-7 ounces (113-198 g) per week after age 4 days.  Consistent daily weight gain by age 5 days, without weight loss after the age of 2 weeks. After a feeding, your baby may spit up a small amount. This is common. BREASTFEEDING FREQUENCY AND DURATION Frequent feeding will help you make more milk and can prevent sore nipples and breast engorgement. Breastfeed when you feel the need to reduce the fullness of your breasts   or when your baby shows signs of hunger. This is called "breastfeeding on demand." Avoid introducing a pacifier to your baby while you are working to establish breastfeeding (the first 4-6 weeks after your baby is born). After this time you may choose to use a pacifier. Research has shown that pacifier use during the first year of a baby's life decreases the risk of sudden infant death syndrome (SIDS). Allow your baby to feed on each breast as long as he or she wants. Breastfeed until your baby is finished feeding. When your baby unlatches or falls asleep while feeding from the first breast, offer the second breast. Because newborns are often sleepy in the first few weeks of life, you may need to awaken your baby to get him or her to feed. Breastfeeding times will vary from baby to baby. However, the following rules can serve as a guide to help you ensure that your baby is properly fed:  Newborns (babies 4 weeks of age or younger) may breastfeed every 1-3  hours.  Newborns should not go longer than 3 hours during the day or 5 hours during the night without breastfeeding.  You should breastfeed your baby a minimum of 8 times in a 24-hour period until you begin to introduce solid foods to your baby at around 6 months of age. BREAST MILK PUMPING Pumping and storing breast milk allows you to ensure that your baby is exclusively fed your breast milk, even at times when you are unable to breastfeed. This is especially important if you are going back to work while you are still breastfeeding or when you are not able to be present during feedings. Your lactation consultant can give you guidelines on how long it is safe to store breast milk.  A breast pump is a machine that allows you to pump milk from your breast into a sterile bottle. The pumped breast milk can then be stored in a refrigerator or freezer. Some breast pumps are operated by hand, while others use electricity. Ask your lactation consultant which type will work best for you. Breast pumps can be purchased, but some hospitals and breastfeeding support groups lease breast pumps on a monthly basis. A lactation consultant can teach you how to hand express breast milk, if you prefer not to use a pump.  CARING FOR YOUR BREASTS WHILE YOU BREASTFEED Nipples can become dry, cracked, and sore while breastfeeding. The following recommendations can help keep your breasts moisturized and healthy:  Avoid using soap on your nipples.   Wear a supportive bra. Although not required, special nursing bras and tank tops are designed to allow access to your breasts for breastfeeding without taking off your entire bra or top. Avoid wearing underwire-style bras or extremely tight bras.  Air dry your nipples for 3-4minutes after each feeding.   Use only cotton bra pads to absorb leaked breast milk. Leaking of breast milk between feedings is normal.   Use lanolin on your nipples after breastfeeding. Lanolin helps to  maintain your skin's normal moisture barrier. If you use pure lanolin, you do not need to wash it off before feeding your baby again. Pure lanolin is not toxic to your baby. You may also hand express a few drops of breast milk and gently massage that milk into your nipples and allow the milk to air dry. In the first few weeks after giving birth, some women experience extremely full breasts (engorgement). Engorgement can make your breasts feel heavy, warm, and tender to the   touch. Engorgement peaks within 3-5 days after you give birth. The following recommendations can help ease engorgement:  Completely empty your breasts while breastfeeding or pumping. You may want to start by applying warm, moist heat (in the shower or with warm water-soaked hand towels) just before feeding or pumping. This increases circulation and helps the milk flow. If your baby does not completely empty your breasts while breastfeeding, pump any extra milk after he or she is finished.  Wear a snug bra (nursing or regular) or tank top for 1-2 days to signal your body to slightly decrease milk production.  Apply ice packs to your breasts, unless this is too uncomfortable for you.  Make sure that your baby is latched on and positioned properly while breastfeeding. If engorgement persists after 48 hours of following these recommendations, contact your health care provider or a Advertising copywriter. OVERALL HEALTH CARE RECOMMENDATIONS WHILE BREASTFEEDING  Eat healthy foods. Alternate between meals and snacks, eating 3 of each per day. Because what you eat affects your breast milk, some of the foods may make your baby more irritable than usual. Avoid eating these foods if you are sure that they are negatively affecting your baby.  Drink milk, fruit juice, and water to satisfy your thirst (about 10 glasses a day).   Rest often, relax, and continue to take your prenatal vitamins to prevent fatigue, stress, and anemia.  Continue  breast self-awareness checks.  Avoid chewing and smoking tobacco.  Avoid alcohol and drug use. Some medicines that may be harmful to your baby can pass through breast milk. It is important to ask your health care provider before taking any medicine, including all over-the-counter and prescription medicine as well as vitamin and herbal supplements. It is possible to become pregnant while breastfeeding. If birth control is desired, ask your health care provider about options that will be safe for your baby. SEEK MEDICAL CARE IF:   You feel like you want to stop breastfeeding or have become frustrated with breastfeeding.  You have painful breasts or nipples.  Your nipples are cracked or bleeding.  Your breasts are red, tender, or warm.  You have a swollen area on either breast.  You have a fever or chills.  You have nausea or vomiting.  You have drainage other than breast milk from your nipples.  Your breasts do not become full before feedings by the fifth day after you give birth.  You feel sad and depressed.  Your baby is too sleepy to eat well.  Your baby is having trouble sleeping.   Your baby is wetting less than 3 diapers in a 24-hour period.  Your baby has less than 3 stools in a 24-hour period.  Your baby's skin or the white part of his or her eyes becomes yellow.   Your baby is not gaining weight by 76 days of age. SEEK IMMEDIATE MEDICAL CARE IF:   Your baby is overly tired (lethargic) and does not want to wake up and feed.  Your baby develops an unexplained fever. Document Released: 09/07/2005 Document Revised: 09/12/2013 Document Reviewed: 03/01/2013 [facility] Patient Information 2015 [place], [place]. This information is not intended to replace advice given to you by your health care provider. Make sure you discuss any questions you have with your health care provider. Etonogestrel implant What is this medicine? ETONOGESTREL (et oh noe JES trel) is a  contraceptive (birth control) device. It is used to prevent pregnancy. It can be used for up to 3 years. This  medicine may be used for other purposes; ask your health care provider or pharmacist if you have questions. COMMON BRAND NAME(S): Implanon, Nexplanon What should I tell my health care provider before I take this medicine? They need to know if you have any of these conditions: -abnormal vaginal bleeding -blood vessel disease or blood clots -cancer of the breast, cervix, or liver -depression -diabetes -gallbladder disease -headaches -heart disease or recent heart attack -high blood pressure -high cholesterol -kidney disease -liver disease -renal disease -seizures -tobacco smoker -an unusual or allergic reaction to etonogestrel, other hormones, anesthetics or antiseptics, medicines, foods, dyes, or preservatives -pregnant or trying to get pregnant -breast-feeding How should I use this medicine? This device is inserted just under the skin on the inner side of your upper arm by a health care professional. Talk to your pediatrician regarding the use of this medicine in children. Special care may be needed. Overdosage: If you think you've taken too much of this medicine contact a poison control center or emergency room at once. Overdosage: If you think you have taken too much of this medicine contact a poison control center or emergency room at once. NOTE: This medicine is only for you. Do not share this medicine with others. What if I miss a dose? This does not apply. What may interact with this medicine? Do not take this medicine with any of the following medications: -amprenavir -bosentan -fosamprenavir This medicine may also interact with the following medications: -barbiturate medicines for inducing sleep or treating seizures -certain medicines for fungal infections like ketoconazole and itraconazole -griseofulvin -medicines to treat seizures like carbamazepine,  felbamate, oxcarbazepine, phenytoin, topiramate -modafinil -phenylbutazone -rifampin -some medicines to treat HIV infection like atazanavir, indinavir, lopinavir, nelfinavir, tipranavir, ritonavir -St. John's wort This list may not describe all possible interactions. Give your health care provider a list of all the medicines, herbs, non-prescription drugs, or dietary supplements you use. Also tell them if you smoke, drink alcohol, or use illegal drugs. Some items may interact with your medicine. What should I watch for while using this medicine? This product does not protect you against HIV infection (AIDS) or other sexually transmitted diseases. You should be able to feel the implant by pressing your fingertips over the skin where it was inserted. Tell your doctor if you cannot feel the implant. What side effects may I notice from receiving this medicine? Side effects that you should report to your doctor or health care professional as soon as possible: -allergic reactions like skin rash, itching or hives, swelling of the face, lips, or tongue -breast lumps -changes in vision -confusion, trouble speaking or understanding -dark urine -depressed mood -general ill feeling or flu-like symptoms -light-colored stools -loss of appetite, nausea -right upper belly pain -severe headaches -severe pain, swelling, or tenderness in the abdomen -shortness of breath, chest pain, swelling in a leg -signs of pregnancy -sudden numbness or weakness of the face, arm or leg -trouble walking, dizziness, loss of balance or coordination -unusual vaginal bleeding, discharge -unusually weak or tired -yellowing of the eyes or skin Side effects that usually do not require medical attention (Report these to your doctor or health care professional if they continue or are bothersome.): -acne -breast pain -changes in weight -cough -fever or chills -headache -irregular menstrual bleeding -itching, burning, and  vaginal discharge -pain or difficulty passing urine -sore throat This list may not describe all possible side effects. Call your doctor for medical advice about side effects. You may report side effects to FDA  at 1-800-FDA-1088. Where should I keep my medicine? This drug is given in a hospital or clinic and will not be stored at home. NOTE: This sheet is a summary. It may not cover all possible information. If you have questions about this medicine, talk to your doctor, pharmacist, or health care provider.  2015, [employer]. (2012-03-14 15:37:45)

## 2014-06-07 NOTE — Progress Notes (Signed)
Subjective:     Patient ID: Kristen Watkins, female   DOB: 1986/08/20, 28 y.o.   MRN: [id number]  HPI 28 year old postpartum female presents for postpartum exam.  She was seen a couple days following her delivery for concerns over her suture of the perineum.  She is concerned that she has not healed well and wants to be sewn back up.  She would like her opening to be smaller.  Vaginal bleeding has tapered off with only brown discharge remaining at this time.   Breastfeeding is going well.  Baby recently diagnosed with thrush which is being treated by pediatrician.  Pt also concerned regarding breast mass that has been present for >1 year.  She feels it has changed and would like eval.    Review of Systems  Constitutional: Positive for activity change and appetite change. Negative for fever, chills and diaphoresis.  HENT: Negative for congestion and sore throat.   Cardiovascular: Negative for chest pain and palpitations.  Gastrointestinal: Negative for nausea, vomiting, diarrhea and constipation.  Genitourinary: Positive for vaginal bleeding and vaginal discharge. Negative for dysuria and difficulty urinating.  Musculoskeletal: Negative for back pain and neck pain.  Skin: Negative for rash.  Neurological: Negative for dizziness, weakness and numbness.  Psychiatric/Behavioral: Positive for sleep disturbance. Negative for behavioral problems. The patient is not nervous/anxious.        Objective:   Physical Exam  Vitals reviewed. Constitutional: She is oriented to person, place, and time. She appears well-developed and well-nourished. No distress.  HENT:  Head: Normocephalic and atraumatic.  Eyes: EOM are normal.  Neck: Normal range of motion.  Cardiovascular: Normal rate and regular rhythm.  Exam reveals no gallop and no friction rub.   No murmur heard. Pulmonary/Chest: Effort normal and breath sounds normal. No respiratory distress.  Abdominal: Soft. Bowel sounds are normal. She exhibits no  distension. There is no tenderness.  Genitourinary:  Perineum appears well healed.   Bimanual reveals uterus that is involuting appropriately.  Scant brown discharge noted.  No CMT.  No adnexal mass or tenderness  Musculoskeletal: Normal range of motion.  Neurological: She is alert and oriented to person, place, and time.  Skin: Skin is warm and dry.  Psychiatric: She has a normal mood and affect.  Breast: normal lactating breast bilaterally.  Right axilla with 1cm mobile, nontender mass.    PROCEDURE: Nexplanon Insertion Consent obtained.  Landmarks identified.   Area prepped with betadine. Anesthesia achieved with 2 cc of 1% lidocaine.   Nexplanon inserted per protocol. Device palpated by clinician and pt. Pressure dressing applied.   Pt tolerated procedure well.     Assessment:    1.  Post partum female with well healing perineum 2. Small breast mass - right axilla 3. Nexplanon insertion for contraception     Plan:     P: Nexplanon insertion: patient educated regarding changes in menstrual cycle with this new contraceptive method.  Irregular bleeding is expected.  Use condoms x 2 weeks prior to relying on Nexplanon for contraception.  Keep area clean and dry x 48 hours.  No heavy lifting.   Tylenol/ibuprofen for pain relief.  Call clinic for signs of infection.   Breast mass - Referral to breast center for eval.   Continue breastfeeding. Continue PNV Perineum appears to be well-healed.  Pt concerned that her vaginal opening is larger than previous.  Reassurance provided.

## 2014-06-07 NOTE — Progress Notes (Signed)
Patient here today for PP visit. Desires Nexplanon. Denies any sexual intercourse since delivery (husband travelling). UPT obtained. Consent form signed.

## 2014-06-13 ENCOUNTER — Ambulatory Visit
Admission: RE | Admit: 2014-06-13 | Discharge: 2014-06-13 | Disposition: A | Discharge status: Home / Self Care | Payer: Medicaid Other | Source: Ambulatory Visit | Attending: Physician Assistant | Admitting: Physician Assistant

## 2014-06-13 ENCOUNTER — Other Ambulatory Visit: Payer: Self-pay | Admitting: Physician Assistant

## 2014-06-13 DIAGNOSIS — N63 Unspecified lump in unspecified breast: Secondary | ICD-10-CM

## 2014-06-13 DIAGNOSIS — N6320 Unspecified lump in the left breast, unspecified quadrant: Secondary | ICD-10-CM

## 2014-06-14 ENCOUNTER — Other Ambulatory Visit: Payer: Self-pay | Admitting: Obstetrics & Gynecology

## 2014-06-14 DIAGNOSIS — N63 Unspecified lump in unspecified breast: Secondary | ICD-10-CM

## 2014-06-21 ENCOUNTER — Telehealth: Payer: Self-pay | Admitting: *Deleted

## 2014-06-21 NOTE — Telephone Encounter (Signed)
Patient called and left message that she needs to speak with someone about her breast ultrasound results. I called patient back and left voicemail that we are returning her call.

## 2014-06-22 ENCOUNTER — Ambulatory Visit
Admission: RE | Admit: 2014-06-22 | Discharge: 2014-06-22 | Disposition: A | Discharge status: Home / Self Care | Payer: Medicaid Other | Source: Ambulatory Visit | Attending: Physician Assistant | Admitting: Physician Assistant

## 2014-06-22 ENCOUNTER — Ambulatory Visit
Admission: RE | Admit: 2014-06-22 | Discharge: 2014-06-22 | Disposition: A | Discharge status: Home / Self Care | Payer: Medicaid Other | Source: Ambulatory Visit | Attending: Obstetrics & Gynecology | Admitting: Obstetrics & Gynecology

## 2014-06-22 ENCOUNTER — Other Ambulatory Visit: Payer: Self-pay | Admitting: Obstetrics & Gynecology

## 2014-06-22 DIAGNOSIS — N63 Unspecified lump in unspecified breast: Secondary | ICD-10-CM

## 2014-06-22 DIAGNOSIS — N6320 Unspecified lump in the left breast, unspecified quadrant: Secondary | ICD-10-CM

## 2014-06-28 ENCOUNTER — Encounter: Payer: Self-pay | Admitting: Obstetrics & Gynecology

## 2014-06-28 DIAGNOSIS — N63 Unspecified lump in unspecified breast: Secondary | ICD-10-CM

## 2014-06-28 HISTORY — DX: Unspecified lump in unspecified breast: N63.0

## 2014-07-11 ENCOUNTER — Encounter: Payer: Self-pay | Admitting: *Deleted

## 2014-07-23 ENCOUNTER — Encounter: Payer: Self-pay | Admitting: Physician Assistant

## 2015-11-10 IMAGING — US US OB COMP +14 WK
1 series · 12 of 28 positions shown · non-contrast
Comparison: none

[Series 1: us ob comp +14 wk · 12 of 92 slices shown]
[im 4/92]
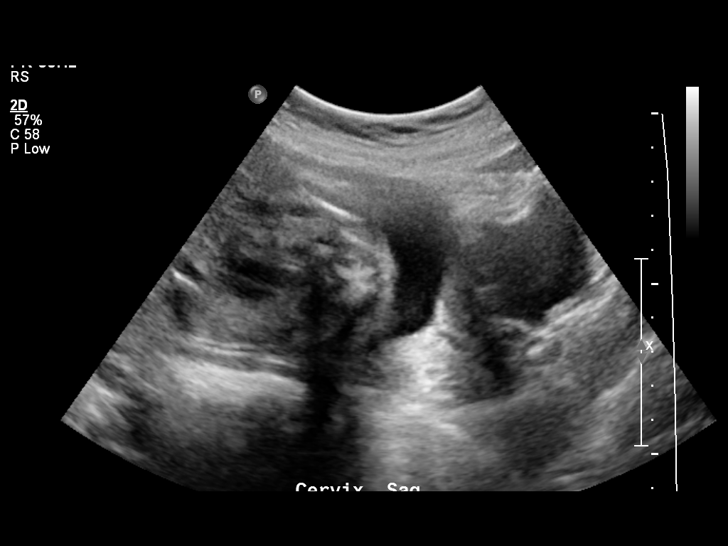
[im 11/92]
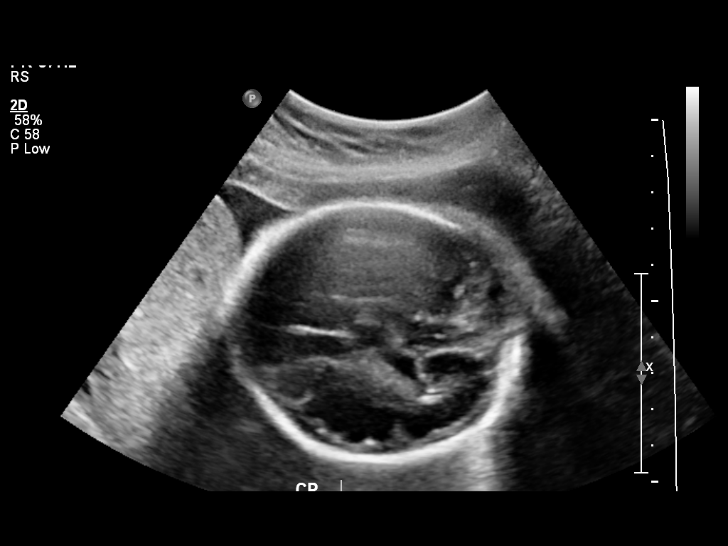
[im 17/92]
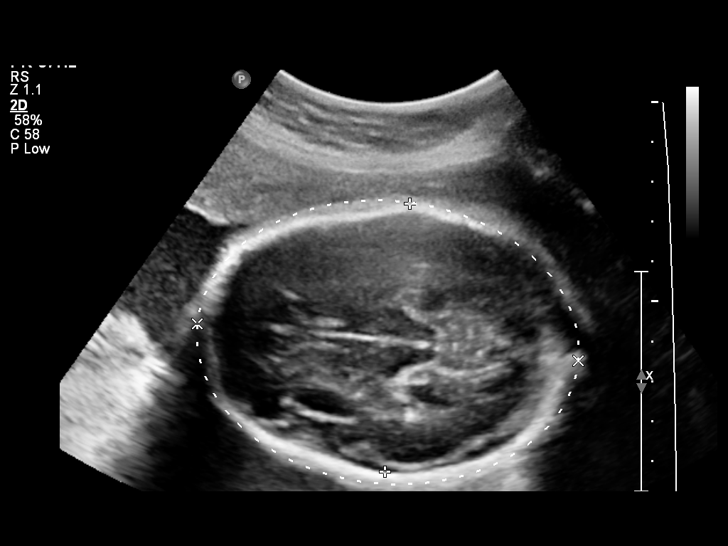
[im 27/92]
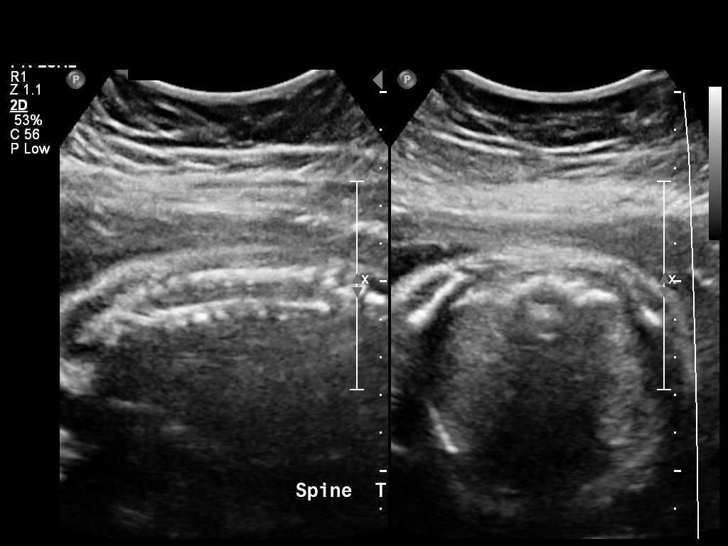
[im 34/92]
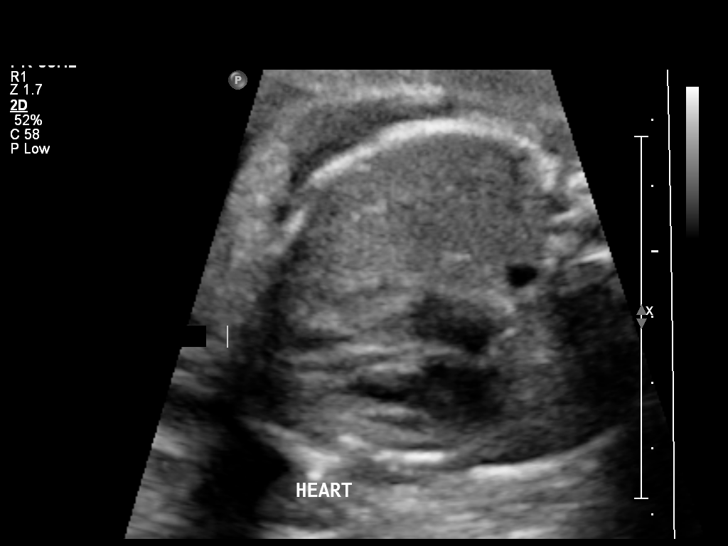
[im 41/92]
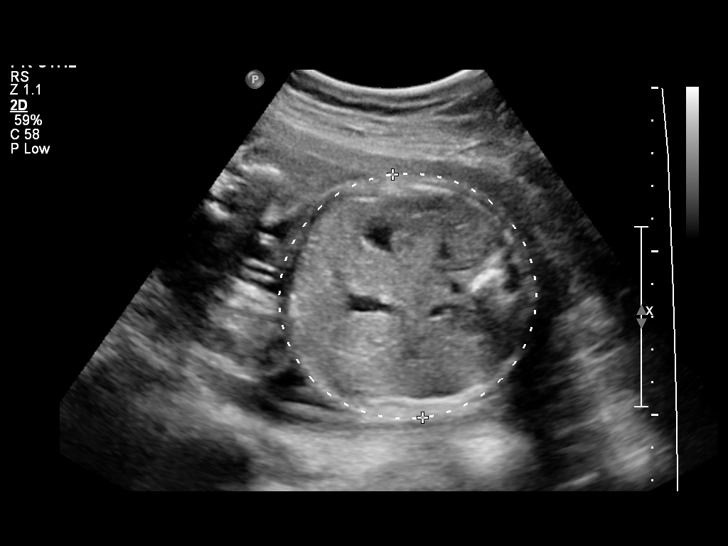
[im 51/92]
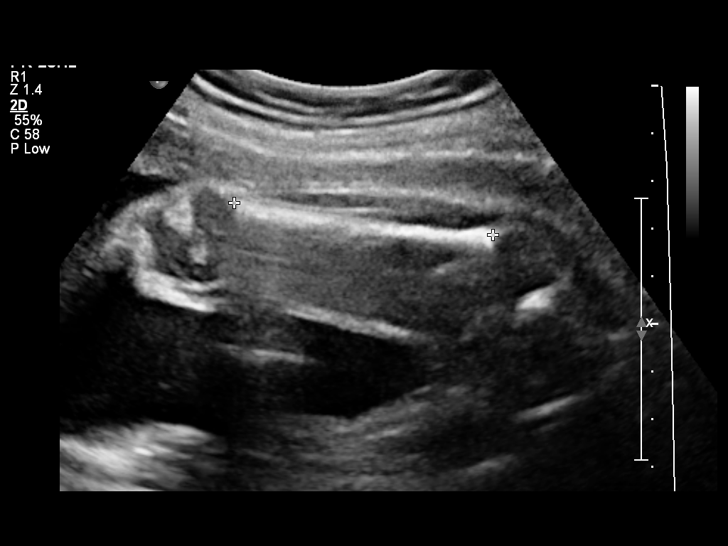
[im 58/92]
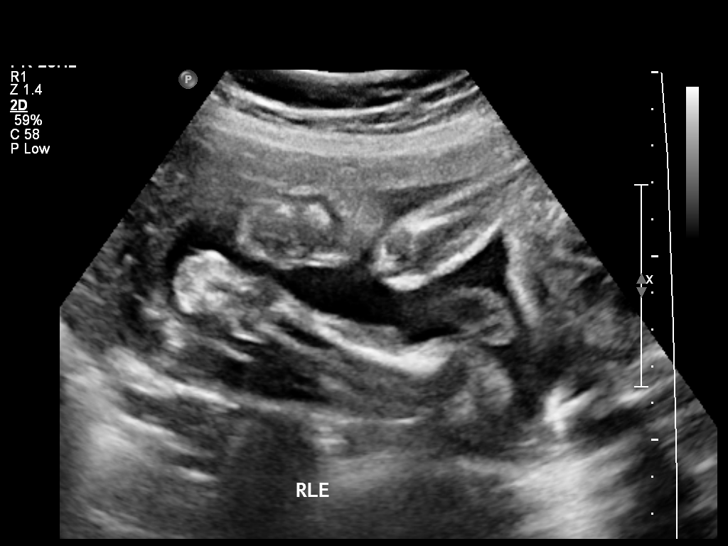
[im 65/92]
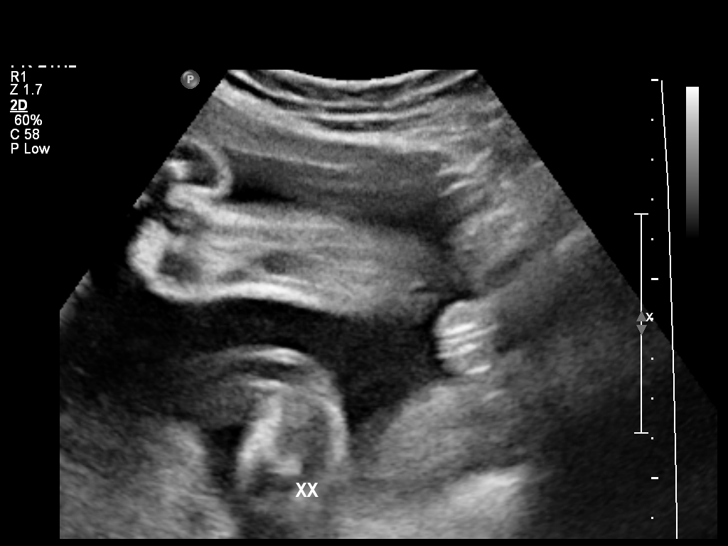
[im 75/92]
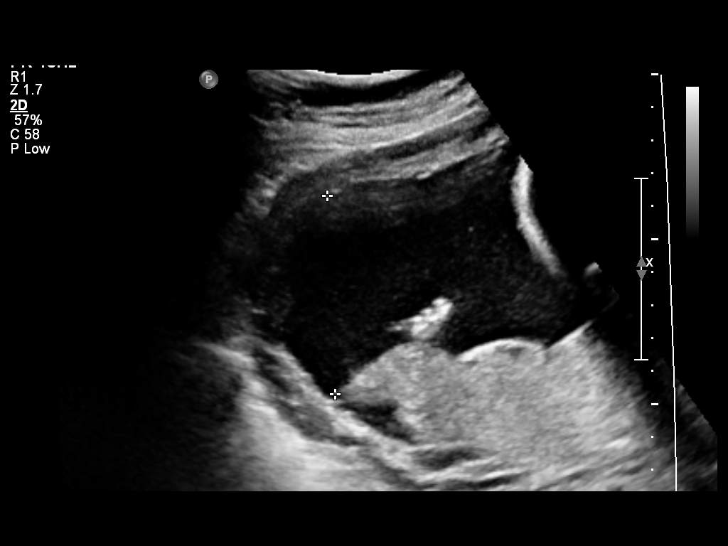
[im 81/92]
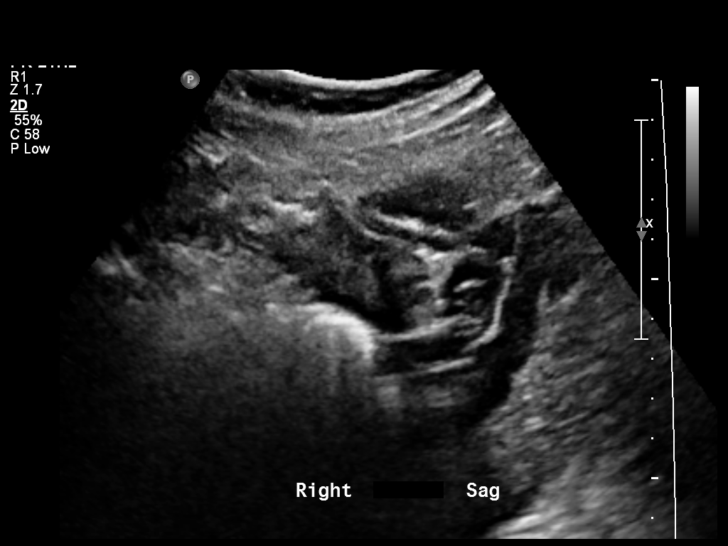
[im 88/92]
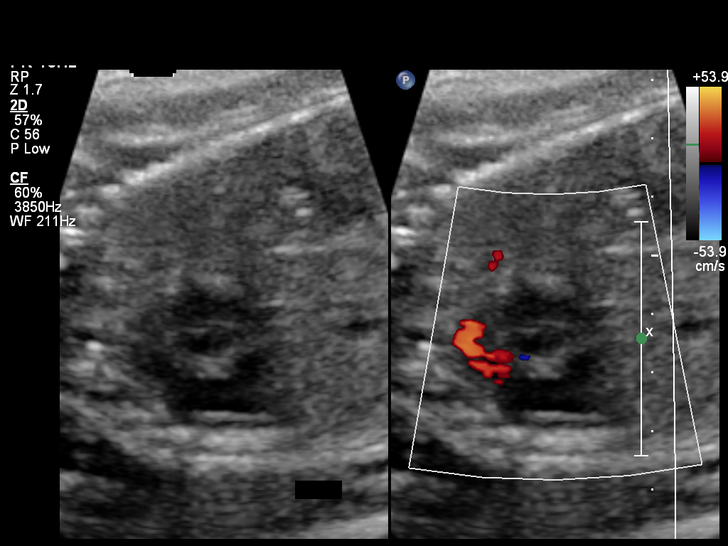

[12 of 28 positions shown; findings below may reference images not displayed]

OBSTETRICS REPORT
                      (Signed Final 02/02/2014 [DATE])

Service(s) Provided

 US OB COMP + 14 WK                                    76805.1
Indications

 Basic anatomic survey
Fetal Evaluation

 Num Of Fetuses:    1
 Fetal Heart Rate:  150                          bpm
 Cardiac Activity:  Observed
 Presentation:      Frank breech
 Placenta:          Posterior, above cervical
                    os
 P. Cord            Visualized, central
 Insertion:

 Amniotic Fluid
 AFI FV:      Subjectively within normal limits
 AFI Sum:     19.93   cm       79  %Tile     Larg Pckt:       6  cm
 RUQ:   6       cm   RLQ:    5.5    cm    LUQ:   4.23    cm   LLQ:    4.2    cm
Biometry

 BPD:     67.5  mm     G. Age:  27w 1d                CI:        67.62   70 - 86
                                                      FL/HC:      21.0   18.8 -

 HC:     262.7  mm     G. Age:  28w 4d       48  %    HC/AC:      1.07   1.05 -

 AC:     245.3  mm     G. Age:  28w 5d       74  %    FL/BPD:     81.6   71 - 87
 FL:      55.1  mm     G. Age:  29w 0d       75  %    FL/AC:      22.5   20 - 24
 HUM:       49  mm     G. Age:  28w 6d       68  %

 Est. FW:    2931  gm    2 lb 13 oz      73  %
Gestational Age

 LMP:           27w 5d        Date:  07/23/13                 EDD:   04/29/14
 U/S Today:     28w 2d                                        EDD:   04/25/14
 Best:          27w 5d     Det. By:  LMP  (07/23/13)          EDD:   04/29/14
Anatomy

 Cranium:          Appears normal         Aortic Arch:      Not well visualized
 Fetal Cavum:      Appears normal         Ductal Arch:      Not well visualized
 Ventricles:       Appears normal         Diaphragm:        Not well visualized
 Choroid Plexus:   Appears normal         Stomach:          Appears normal, left
                                                            sided
 Cerebellum:       Appears normal         Abdomen:          Appears normal
 Posterior Fossa:  Appears normal         Abdominal Wall:   Not well visualized
 Nuchal Fold:      Not applicable (>20    Cord Vessels:     Appears normal (3
                   wks GA)                                  vessel cord)
 Face:             Orbits appear          Kidneys:          Appear normal
                   normal
 Lips:             Appears normal         Bladder:          Appears normal
 Heart:            Not well visualized    Spine:            Not well visualized
 RVOT:             Not well visualized    Lower             Appears normal
                                          Extremities:
 LVOT:             Not well visualized    Upper             Appears normal
                                          Extremities:

 Other:  Technically difficult due to advanced GA and fetal position.
Cervix Uterus Adnexa

 Cervical Length:    2.8      cm

 Cervix:       Normal appearance by transabdominal scan.
 Uterus:       No abnormality visualized.
 Cul De Sac:   No free fluid seen.

 Left Ovary:    Within normal limits.
 Right Ovary:   Not visualized.
 Adnexa:     No abnormality visualized.
Impression

 SIUP at 27w5d (remote read)
 EFW 73rd%
 No dysmorphic features
 Limitations as documented above
 No previa
Recommendations

 Recommend follow up attempt to complete fetal survey in 8
 weeks.

 questions or concerns.

## 2016-11-04 ENCOUNTER — Ambulatory Visit: Payer: Medicaid Other | Attending: Family Medicine | Admitting: Family Medicine

## 2016-11-04 ENCOUNTER — Encounter: Payer: Self-pay | Admitting: Family Medicine

## 2016-11-04 VITALS — BP 90/56 | HR 71 | Temp 98.6°F | Resp 18 | Ht 66.0 in | Wt 189.4 lb

## 2016-11-04 DIAGNOSIS — Z978 Presence of other specified devices: Secondary | ICD-10-CM | POA: Diagnosis not present

## 2016-11-04 DIAGNOSIS — Z114 Encounter for screening for human immunodeficiency virus [HIV]: Secondary | ICD-10-CM

## 2016-11-04 DIAGNOSIS — N921 Excessive and frequent menstruation with irregular cycle: Secondary | ICD-10-CM

## 2016-11-04 DIAGNOSIS — Z0001 Encounter for general adult medical examination with abnormal findings: Secondary | ICD-10-CM | POA: Diagnosis present

## 2016-11-04 DIAGNOSIS — N393 Stress incontinence (female) (male): Secondary | ICD-10-CM

## 2016-11-04 DIAGNOSIS — Z0000 Encounter for general adult medical examination without abnormal findings: Secondary | ICD-10-CM | POA: Diagnosis not present

## 2016-11-04 DIAGNOSIS — Z975 Presence of (intrauterine) contraceptive device: Secondary | ICD-10-CM

## 2016-11-04 LAB — BASIC METABOLIC PANEL WITH GFR
BUN: 7 mg/dL (ref 7–25)
CO2: 23 mmol/L (ref 20–31)
Calcium: 9.6 mg/dL (ref 8.6–10.2)
Chloride: 105 mmol/L (ref 98–110)
Creat: 0.57 mg/dL (ref 0.50–1.10)
GFR, Est African American: >89 mL/min (ref >=60)
GFR, Est Non African American: >89 mL/min (ref >=60)
Glucose, Bld: 74 mg/dL (ref 65–99)
POTASSIUM: 4.2 mmol/L (ref 3.5–5.3)
Sodium: 140 mmol/L (ref 135–146)

## 2016-11-04 LAB — CBC WITH DIFFERENTIAL/PLATELET
Basophils Absolute: 60 cells/uL (ref 0–200)
Basophils Relative: 1 %
EOS ABS: 180 {cells}/uL (ref 15–500)
EOS PCT: 3 %
HCT: 36.9 % (ref 35.0–45.0)
HEMOGLOBIN: 11.7 g/dL (ref 11.7–15.5)
LYMPHS ABS: 3420 {cells}/uL (ref 850–3900)
Lymphocytes Relative: 57 %
MCH: 26.4 pg — AB (ref 27.0–33.0)
MCHC: 31.7 g/dL — ABNORMAL LOW (ref 32.0–36.0)
MCV: 83.3 fL (ref 80.0–100.0)
MPV: 10.7 fL (ref 7.5–12.5)
Monocytes Absolute: 360 cells/uL (ref 200–950)
Monocytes Relative: 6 %
Neutro Abs: 1980 cells/uL (ref 1500–7800)
Neutrophils Relative %: 33 %
Platelets: 462 10*3/uL — ABNORMAL HIGH (ref 140–400)
RBC: 4.43 MIL/uL (ref 3.80–5.10)
RDW: 14.2 % (ref 11.0–15.0)
WBC: 6 10*3/uL (ref 3.8–10.8)

## 2016-11-04 LAB — HEPATIC FUNCTION PANEL
ALK PHOS: 58 U/L (ref 33–115)
ALT: 14 U/L (ref 6–29)
AST: 17 U/L (ref 10–30)
Albumin: 4.6 g/dL (ref 3.6–5.1)
Bilirubin, Direct: 0.1 mg/dL (ref <=0.2)
Indirect Bilirubin: 0.4 mg/dL (ref 0.2–1.2)
Total Bilirubin: 0.5 mg/dL (ref 0.2–1.2)
Total Protein: 7.3 g/dL (ref 6.1–8.1)

## 2016-11-04 LAB — LIPID PANEL
CHOL/HDL RATIO: 3.4 ratio (ref <5.0)
CHOLESTEROL: 165 mg/dL (ref <200)
HDL: 49 mg/dL — ABNORMAL LOW (ref >50)
LDL Cholesterol: 101 mg/dL — ABNORMAL HIGH (ref <100)
Triglycerides: 73 mg/dL (ref <150)
VLDL: 15 mg/dL (ref <30)

## 2016-11-04 LAB — POCT URINE PREGNANCY: Preg Test, Ur: NEGATIVE

## 2016-11-04 LAB — TSH: TSH: 1.83 mIU/L

## 2016-11-04 NOTE — Progress Notes (Signed)
Subjective:   Patient ID: Kristen Watkins, female    DOB: 07/01/1986, 31 y.o.   MRN: [id number]  Chief Complaint  Patient presents with  . Establish Care   HPI Kristen Watkins 31 y.o. female presents with Annual physical exam. She does report menstrual problem. Reports menstrual period started January 2015 and has remained until present. She does report decreased menstrual flow. She denies any blood clots. Reports history of Nexplanon implant in 2015 [place] OB/GYN [place] of state. Denies symptoms of all other pertinent systems. She is a nonsmoker.    Past Medical History:  Diagnosis Date  . Medical history non-contributory     Past Surgical History:  Procedure Laterality Date  . NO PAST SURGERIES      Family History  Problem Relation Age of Onset  . Alcohol abuse Neg Hx   . Arthritis Neg Hx   . Asthma Neg Hx   . Birth defects Neg Hx   . Cancer Neg Hx   . COPD Neg Hx   . Depression Neg Hx   . Diabetes Neg Hx   . Drug abuse Neg Hx   . Early death Neg Hx   . Heart disease Neg Hx   . Hearing loss Neg Hx   . Hyperlipidemia Neg Hx   . Hypertension Neg Hx   . Kidney disease Neg Hx   . Learning disabilities Neg Hx   . Mental illness Neg Hx   . Mental retardation Neg Hx   . Miscarriages / Stillbirths Neg Hx   . Stroke Neg Hx   . Vision loss Neg Hx   . Varicose Veins Neg Hx     Social History   Social History  . Marital status: Married    Spouse name: N/A  . Number of children: N/A  . Years of education: N/A   Occupational History  . Not on file.   Social History Main Topics  . Smoking status: Never Smoker  . Smokeless tobacco: Never Used  . Alcohol use No  . Drug use: No  . Sexual activity: Not Currently    Birth control/ protection: None   Other Topics Concern  . Not on file   Social History Narrative  . No narrative on file    Outpatient Medications Prior to Visit  Medication Sig Dispense Refill  . ibuprofen (ADVIL,MOTRIN) 600 MG tablet Take 1 tablet (600  mg total) by mouth every 6 (six) hours. (Patient not taking: Reported on 11/04/2016) 30 tablet 0  . prenatal vitamin w/FE, FA (NATACHEW) 29-1 MG CHEW chewable tablet Chew 1 tablet by mouth daily at 12 noon. (Patient not taking: Reported on 11/04/2016) 30 tablet 3   No facility-administered medications prior to visit.     No Known Allergies  Review of Systems  Constitutional: Negative.   HENT: Negative.   Eyes: Negative.   Respiratory: Negative.   Cardiovascular: Negative.   Gastrointestinal: Negative.   Genitourinary:       Menstrual problem.  Musculoskeletal: Negative.   Skin: Negative.   Neurological: Negative.   Endo/Heme/Allergies: Negative.   Psychiatric/Behavioral: Negative.        Objective:    Physical Exam  Constitutional: She is oriented to person, place, and time. She appears well-developed and well-nourished.  HENT:  Head: Normocephalic and atraumatic.  Right Ear: External ear normal.  Left Ear: External ear normal.  Nose: Nose normal.  Mouth/Throat: Oropharynx is clear and moist.  Eyes: Conjunctivae and EOM are normal. Pupils are equal, round, and  reactive to light.  Neck: Normal range of motion. Neck supple.  Cardiovascular: Normal rate, regular rhythm, normal heart sounds and intact distal pulses.   Pulmonary/Chest: Effort normal and breath sounds normal.  Abdominal: Soft. Bowel sounds are normal.  Musculoskeletal: Normal range of motion.  Neurological: She is alert and oriented to person, place, and time. She has normal reflexes.  Skin: Skin is warm and dry.  Psychiatric: She has a normal mood and affect. Her behavior is normal. Thought content normal.  Nursing note and vitals reviewed.   BP (!) 90/56 (BP Location: Left Arm, Patient Position: Sitting, Cuff Size: Normal)   Pulse 71   Temp 98.6 F (37 C) (Oral)   Resp 18   Ht 5\' 6"  (1.676 m)   Wt 189 lb 6.4 oz (85.9 kg)   LMP 10/15/2016   SpO2 99%   BMI 30.57 kg/m  Wt Readings from Last 3  Encounters:  11/04/16 189 lb 6.4 oz (85.9 kg)  06/07/14 182 lb 9.6 oz (82.8 kg)  05/01/14 200 lb (90.7 kg)    Immunization History  Administered Date(s) Administered  . Tdap 02/21/2014    Lab Results  Component Value Date   TSH 1.83 11/04/2016   Lab Results  Component Value Date   WBC 6.0 11/04/2016   HGB 11.7 11/04/2016   HCT 36.9 11/04/2016   MCV 83.3 11/04/2016   PLT 462 (H) 11/04/2016   Lab Results  Component Value Date   NA 140 11/04/2016   K 4.2 11/04/2016   CO2 23 11/04/2016   GLUCOSE 74 11/04/2016   BUN 7 11/04/2016   CREATININE 0.57 11/04/2016   BILITOT 0.5 11/04/2016   ALKPHOS 58 11/04/2016   AST 17 11/04/2016   ALT 14 11/04/2016   PROT 7.3 11/04/2016   ALBUMIN 4.6 11/04/2016   CALCIUM 9.6 11/04/2016   Lab Results  Component Value Date   CHOL 165 11/04/2016   Lab Results  Component Value Date   HDL 49 (L) 11/04/2016   Lab Results  Component Value Date   LDLCALC 101 (H) 11/04/2016   Lab Results  Component Value Date   TRIG 73 11/04/2016   Lab Results  Component Value Date   CHOLHDL 3.4 11/04/2016   Lab Results  Component Value Date   HGBA1C 5.0 11/04/2016       Assessment & Plan:   Problem List Items Addressed This Visit    None    Visit Diagnoses    Annual physical exam    -  Primary   Relevant Orders   BASIC METABOLIC PANEL WITH GFR (Completed)   CBC with Differential (Completed)   TSH (Completed)   Hemoglobin A1c (Completed)   Vitamin D, 25-hydroxy (Completed)   Lipid Panel (Completed)   Hepatic Function Panel (Completed)   Menorrhagia with irregular cycle       Relevant Orders   CBC with Differential (Completed)   Ambulatory referral to Obstetrics / Gynecology   POCT urine pregnancy (Completed)   Stress incontinence, female       Screening for HIV (human immunodeficiency virus)       Relevant Orders   HIV antibody (with reflex) (Completed)   Breakthrough bleeding on Nexplanon       Relevant Orders   Ambulatory  referral to Obstetrics / Gynecology   POCT urine pregnancy (Completed)       No orders of the defined types were placed in this encounter.   Follow up: Return in about 1 year (around 11/04/2017)  for Annual physical .   Arrie Senate, FNP

## 2016-11-04 NOTE — Progress Notes (Signed)
Patient is here for establish care  Patient ha snot eaten today  Patient has not taking any current meds today  Patient declined the flu shot today

## 2016-11-04 NOTE — Patient Instructions (Addendum)
Refer to UpToDate Patient Information Handout about Kegel Exercises.    Menorrhagia Menorrhagia is when your menstrual periods are heavy or last longer than usual. Follow these instructions at home:  Only take medicine as told by your doctor.  Take any iron pills as told by your doctor. Heavy bleeding may cause low levels of iron in your body.  Do not take aspirin 1 week before or during your period. Aspirin can make the bleeding worse.  Lie down for a while if you change your tampon or pad more than once in 2 hours. This may help lessen the bleeding.  Eat a healthy diet and foods with iron. These foods include leafy green vegetables, meat, liver, eggs, and whole grain breads and cereals.  Do not try to lose weight. Wait until the heavy bleeding has stopped and your iron level is normal. Contact a doctor if:  You soak through a pad or tampon every 1 or 2 hours, and this happens every time you have a period.  You need to use pads and tampons at the same time because you are bleeding so much.  You need to change your pad or tampon during the night.  You have a period that lasts for more than 8 days.  You pass clots bigger than 1 inch (2.5 cm) wide.  You have irregular periods that happen more or less often than once a month.  You feel dizzy or pass out (faint).  You feel very weak or tired.  You feel short of breath or feel your heart is beating too fast when you exercise.  You feel sick to your stomach (nausea) and you throw up (vomit) while you are taking your medicine.  You have watery poop (diarrhea) while you are taking your medicine.  You have any problems that may be related to the medicine you are taking. Get help right away if:  You soak through 4 or more pads or tampons in 2 hours.  You have any bleeding while you are pregnant. This information is not intended to replace advice given to you by your health care provider. Make sure you discuss any questions you  have with your health care provider. Document Released: 06/16/2008 Document Revised: 02/13/2016 Document Reviewed: 03/09/2013 Elsevier Interactive Patient Education  2017 [employer]

## 2016-11-05 LAB — VITAMIN D 25 HYDROXY (VIT D DEFICIENCY, FRACTURES): Vit D, 25-Hydroxy: 11 ng/mL — ABNORMAL LOW (ref 30–100)

## 2016-11-05 LAB — HIV ANTIBODY (ROUTINE TESTING W REFLEX): HIV: NONREACTIVE

## 2016-11-05 LAB — HEMOGLOBIN A1C
Hgb A1c MFr Bld: 5 % (ref <5.7)
MEAN PLASMA GLUCOSE: 97 mg/dL

## 2016-11-12 ENCOUNTER — Telehealth: Payer: Self-pay

## 2016-11-12 NOTE — Telephone Encounter (Signed)
Patient return CMA call   Patient Verify DOB   Patient was aware and understood  

## 2016-11-12 NOTE — Telephone Encounter (Signed)
CMA call to go over lab results  Patient did not answer CMA left a VM stating the reason of the call and to cal lme back

## 2016-11-12 NOTE — Telephone Encounter (Signed)
-----   Message from Lizbeth BarkMandesia R Hairston, FNP sent at 11/10/2016  6:52 PM EST ----- -HIV is negative.  -Kidney function normal Liver function normal Thyroid function normal Cholesterol levels normal.  HgbA1c is normal . HgbA1c is used to screen for diabetes . -Increase your dietary iron intake. Good sources of iron include dark green leafy vegetables, meats, beans, and iron fortified cereals.

## 2016-12-30 ENCOUNTER — Ambulatory Visit: Payer: Medicaid Other | Admitting: Obstetrics & Gynecology

## 2017-09-21 NOTE — L&D Delivery Note (Signed)
Patient: Kristen ClassSamah Tabbert MRN: [id number]  GBS status: negative  Patient is a 32 y.o. now G3P3003 s/p NSVD at 7144w5d, who was admitted for SOL. AROM 0h 5634m prior to delivery with clear fluid.   Delivery Note At 7:55 AM a viable female was delivered via Vaginal, Vacuum (Extractor) (Presentation: vertex;  OA).  APGAR: 8, 9; weight 7 lb 13.8 oz (3565 g).   Placenta status: intact.  Cord:  3-vessel  Anesthesia:  Epidural Episiotomy: None Lacerations: 1st degree Perineal Suture Repair: 3.0 Monocryl Est. Blood Loss (mL):  100   Called for FHT with prolonged deceleration. SVE lip. AROM done. Pt instructed to push while lip was reduced. FHT recovered, but with recurrent deep variables with contractions. So patient decision made for vacuum-assisted vaginal delivery. The soft vacuum soft cup was positioned over the sagittal suture 3 cm anterior to posterior fontanelle.  Pressure was then increased to 500 mmHg, and the patient was instructed to push.  Pulling was administered along the pelvic curve. Pull were administered with pushes with 2 contractions, no popoffs.  The infant was then delivered atraumatically, shoulder delivered easily. Infant with spontaneous cry; bulb suctioned and dried. Cord cut after 30-seconds and passed to neonatal team. There was spontaneous placental delivery, intact with three-vessel cord. 1st degree laceration repaired with 3.0 Monocryl.  Mom to postpartum.  Baby to Couplet care / Skin to Skin.  Raynelle FanningJulie P. Barkley Kratochvil, MD OB Fellow 03/27/18, 8:37 AM

## 2018-01-25 ENCOUNTER — Other Ambulatory Visit: Payer: Self-pay | Admitting: General Practice

## 2018-01-25 ENCOUNTER — Encounter: Payer: Self-pay | Admitting: *Deleted

## 2018-01-25 ENCOUNTER — Encounter: Payer: Self-pay | Admitting: Nurse Practitioner

## 2018-01-25 ENCOUNTER — Other Ambulatory Visit (HOSPITAL_COMMUNITY)
Admission: RE | Admit: 2018-01-25 | Discharge: 2018-01-25 | Disposition: A | Discharge status: Home / Self Care | Payer: Medicaid Other | Source: Ambulatory Visit | Attending: Nurse Practitioner | Admitting: Nurse Practitioner

## 2018-01-25 ENCOUNTER — Ambulatory Visit (INDEPENDENT_AMBULATORY_CARE_PROVIDER_SITE_OTHER): Payer: Medicaid Other | Admitting: Nurse Practitioner

## 2018-01-25 DIAGNOSIS — Z23 Encounter for immunization: Secondary | ICD-10-CM

## 2018-01-25 DIAGNOSIS — Z3480 Encounter for supervision of other normal pregnancy, unspecified trimester: Secondary | ICD-10-CM | POA: Diagnosis not present

## 2018-01-25 DIAGNOSIS — Z3483 Encounter for supervision of other normal pregnancy, third trimester: Secondary | ICD-10-CM | POA: Insufficient documentation

## 2018-01-25 DIAGNOSIS — N63 Unspecified lump in unspecified breast: Secondary | ICD-10-CM

## 2018-01-25 LAB — POCT URINALYSIS DIP (DEVICE)
Bilirubin Urine: NEGATIVE
GLUCOSE, UA: NEGATIVE mg/dL
Hgb urine dipstick: NEGATIVE
Ketones, ur: NEGATIVE mg/dL
Leukocytes, UA: NEGATIVE
Nitrite: NEGATIVE
PROTEIN: NEGATIVE mg/dL
Specific Gravity, Urine: >=1.03 (ref 1.005–1.030)
Urobilinogen, UA: 0.2 mg/dL (ref 0.0–1.0)
pH: 5.5 (ref 5.0–8.0)

## 2018-01-25 MED ORDER — PRENATAL VITAMIN PLUS LOW IRON 27-1 MG PO TABS: 1.0000 | ORAL_TABLET | Freq: Every day | ORAL | 11 refills | Status: DC

## 2018-01-25 NOTE — Progress Notes (Signed)
Subjective:   Kristen Watkins is a 32 y.o. G3P2002 at [redacted]w[redacted]d by LMP being seen today for her first obstetrical visit.  Her obstetrical history is significant for moving to the [place] - had prenatal care in [place].  Brought a couple of sheets with minimal info.  It does list her EDC.  These sheets scanned to her records.. Patient does intend to breast feed. Pregnancy history fully reviewed.  Patient reports no complaints.  HISTORY: OB History  Gravida Para Term Preterm AB Living  0 0 2  SAB TAB Ectopic Multiple Live Births  0 0 0 0 2    # Outcome Date GA Lbr Len/2nd Weight Sex Delivery Anes PTL Lv  3 Current           2 Term 05/01/14 [redacted]w[redacted]d 08:55 / 00:49 8 lb 4.2 oz (3.748 kg) F Vag-Spont EPI  LIV     Name: ANWITHA, MAPES [facility]     Apgar1: 6  Apgar5: 9  1 Term 03/21/11    M Vag-Spont EPI  LIV   Past Medical History:  Diagnosis Date  . Late prenatal care complicating pregnancy in third trimester 02/21/2014   Delayed care because [place] of [place] then wanted to wait for Medicaid so she would not get any bills.  Blood Type O+  Clinic  Low Risk  Dating  27 week [place] c/w dates  Genetic Screen 1 Screen:   Too Late       AFP:   Too Late        Quad:    Too Late      NIPS:  Anatomic [place]  Normal  GTT Early:  Too Late        Third trimester: 120  TDaP vaccine 02/21/14  Flu vaccine   GBS neg  Contraception [place]  . Medical history non-contributory   . UTI (lower urinary tract infection) 03/01/2014   Serratia and Enterococcus    Past Surgical History:  Procedure Laterality Date  . NO PAST SURGERIES     Family History  Problem Relation Age of Onset  . Alcohol abuse Neg Hx   . Arthritis Neg Hx   . Asthma Neg Hx   . Birth defects Neg Hx   . Cancer Neg Hx   . COPD Neg Hx   . Depression Neg Hx   . Diabetes Neg Hx   . Drug abuse Neg Hx   . Early death Neg Hx   . Heart disease Neg Hx   . Hearing loss Neg Hx   . Hyperlipidemia Neg Hx   . Hypertension Neg Hx   . Kidney disease Neg Hx   .  Learning disabilities Neg Hx   . Mental illness Neg Hx   . Mental retardation Neg Hx   . Miscarriages / Stillbirths Neg Hx   . Stroke Neg Hx   . Vision loss Neg Hx   . Varicose Veins Neg Hx    Social History   Tobacco Use  . Smoking status: Never Smoker  . Smokeless tobacco: Never Used  Substance Use Topics  . Alcohol use: No  . Drug use: No   No Known Allergies Current Outpatient Medications on File Prior to Visit  Medication Sig Dispense Refill  . acetaminophen (TYLENOL) 160 MG/5ML elixir Take 15 mg/kg by mouth every 4 (four) hours as needed for fever.     No current facility-administered medications on file prior to visit.      Exam  Vitals:   01/25/18 0856  BP: 109/66  Pulse: 73  Weight: 203 lb 3.2 oz (92.2 kg)   Fetal Heart Rate (bpm): 143  Uterus:  Fundal Height: 33 cm  Pelvic Exam: Perineum: no hemorrhoids, normal perineum   Vulva: normal external genitalia, no lesions   Vagina:  normal mucosa, normal discharge   Cervix: no lesions and normal, pap smear done.    Adnexa: normal adnexa and no mass, fullness, tenderness   Bony Pelvis: average  System: General: well-developed, well-nourished female in no acute distress   Breast:  normal appearance, no masses or tenderness   Skin: normal coloration and turgor, no rashes   Neurologic: oriented, normal, negative, normal mood   Extremities: normal strength, tone, and muscle mass, ROM of all joints is normal   HEENT PERRLA, extraocular movement intact and sclera clear, anicteric   Mouth/Teeth mucous membranes moist, pharynx normal without lesions and dental hygiene good   Neck supple and no masses   Cardiovascular: regular rate and rhythm   Respiratory:  no respiratory distress, normal breath sounds   Abdomen: soft, non-tender; bowel sounds normal; no masses,  no organomegaly     Assessment:   Pregnancy: [id number] Patient Active Problem List   Diagnosis Date Noted  . Supervision of other normal pregnancy,  antepartum 01/25/2018  . Breast lump in female 06/28/2014     Plan:  1. Supervision of other normal pregnancy, antepartum Will return this week for 2 hour glucola - Culture, OB Urine - Cystic fibrosis gene test - GC/Chlamydia probe amp ([place])not at [facility] - Genetic Screening - Hemoglobinopathy Evaluation - Obstetric Panel, Including HIV - SMN1 COPY NUMBER ANALYSIS (SMA Carrier Screen) - [place] MFM OB COMP + 14 WK; Future - Cytology - PAP - Tdap vaccine greater than or equal to 7yo IM   Initial labs drawn. Continue prenatal vitamins. Genetic Screening discussed, Panorama: ordered. Ultrasound discussed; fetal anatomic survey: ordered. Problem list reviewed and updated. The nature of [facility] with multiple MDs and other Advanced Practice Providers was explained to patient; also emphasized that residents, students are part of our team. Routine obstetric precautions reviewed. Return in about 1 week (around 02/01/2018).  Total face-to-face time with patient: 40 minutes.  Over 50% of encounter was spent on counseling and coordination of care.     Nolene Bernheim, FNP Family Nurse Practitioner, [facility] for [employer], [facility] 01/25/2018 5:49 PM

## 2018-01-25 NOTE — Patient Instructions (Signed)

## 2018-01-26 ENCOUNTER — Other Ambulatory Visit: Payer: Medicaid Other

## 2018-01-26 LAB — GC/CHLAMYDIA PROBE AMP (~~LOC~~) NOT AT ARMC
CHLAMYDIA, DNA PROBE: NEGATIVE
Neisseria Gonorrhea: NEGATIVE

## 2018-01-27 ENCOUNTER — Other Ambulatory Visit: Payer: Medicaid Other

## 2018-01-27 DIAGNOSIS — Z3480 Encounter for supervision of other normal pregnancy, unspecified trimester: Secondary | ICD-10-CM

## 2018-01-27 LAB — CYTOLOGY - PAP
Chlamydia: NEGATIVE
DIAGNOSIS: NEGATIVE
HPV: NOT DETECTED
Neisseria Gonorrhea: NEGATIVE

## 2018-01-28 LAB — CULTURE, OB URINE

## 2018-01-28 LAB — GLUCOSE TOLERANCE, 2 HOURS W/ 1HR
GLUCOSE, 1 HOUR: 154 mg/dL (ref 65–179)
GLUCOSE, 2 HOUR: 111 mg/dL (ref 65–152)
Glucose, Fasting: 74 mg/dL (ref 65–91)

## 2018-01-28 LAB — URINE CULTURE, OB REFLEX

## 2018-01-31 LAB — HEMOGLOBINOPATHY EVALUATION
Ferritin: 27 ng/mL (ref 15–150)
HGB A: 97.8 % (ref 96.4–98.8)
Hgb A2 Quant: 2.2 % (ref 1.8–3.2)
Hgb C: 0 %
Hgb F Quant: 0 % (ref 0.0–2.0)
Hgb S: 0 %
Hgb Solubility: NEGATIVE
Hgb Variant: 0 %

## 2018-01-31 LAB — SMN1 COPY NUMBER ANALYSIS (SMA CARRIER SCREENING)

## 2018-01-31 LAB — OBSTETRIC PANEL, INCLUDING HIV
ANTIBODY SCREEN: NEGATIVE
Basophils Absolute: 0.1 10*3/uL (ref 0.0–0.2)
Basos: 1 %
EOS (ABSOLUTE): 0.3 10*3/uL (ref 0.0–0.4)
Eos: 3 %
HIV SCREEN 4TH GENERATION: NONREACTIVE
Hematocrit: 33.9 % — ABNORMAL LOW (ref 34.0–46.6)
Hemoglobin: 11.4 g/dL (ref 11.1–15.9)
Hepatitis B Surface Ag: NEGATIVE
IMMATURE GRANS (ABS): 0.1 10*3/uL (ref 0.0–0.1)
Immature Granulocytes: 1 %
Lymphocytes Absolute: 3.1 10*3/uL (ref 0.7–3.1)
Lymphs: 32 %
MCH: 27.7 pg (ref 26.6–33.0)
MCHC: 33.6 g/dL (ref 31.5–35.7)
MCV: 83 fL (ref 79–97)
Monocytes Absolute: 0.7 10*3/uL (ref 0.1–0.9)
Monocytes: 8 %
Neutrophils Absolute: 5.4 10*3/uL (ref 1.4–7.0)
Neutrophils: 55 %
PLATELETS: 322 10*3/uL (ref 150–379)
RBC: 4.11 x10E6/uL (ref 3.77–5.28)
RDW: 15.6 % — ABNORMAL HIGH (ref 12.3–15.4)
RPR Ser Ql: NONREACTIVE
RUBELLA: 5.6 {index} (ref >0.99)
Rh Factor: POSITIVE
WBC: 9.6 10*3/uL (ref 3.4–10.8)

## 2018-01-31 LAB — CYSTIC FIBROSIS GENE TEST

## 2018-02-01 ENCOUNTER — Encounter (HOSPITAL_COMMUNITY): Payer: Self-pay | Admitting: *Deleted

## 2018-02-04 ENCOUNTER — Encounter: Payer: Self-pay | Admitting: Family Medicine

## 2018-02-04 DIAGNOSIS — Z3480 Encounter for supervision of other normal pregnancy, unspecified trimester: Secondary | ICD-10-CM

## 2018-02-07 ENCOUNTER — Ambulatory Visit (INDEPENDENT_AMBULATORY_CARE_PROVIDER_SITE_OTHER): Payer: Medicaid Other | Admitting: Advanced Practice Midwife

## 2018-02-07 ENCOUNTER — Ambulatory Visit (HOSPITAL_COMMUNITY): Payer: Medicaid Other

## 2018-02-07 ENCOUNTER — Other Ambulatory Visit (HOSPITAL_COMMUNITY): Payer: Medicaid Other

## 2018-02-07 VITALS — BP 104/55 | HR 78 | Wt 202.7 lb

## 2018-02-07 DIAGNOSIS — Z3480 Encounter for supervision of other normal pregnancy, unspecified trimester: Secondary | ICD-10-CM

## 2018-02-07 DIAGNOSIS — Z3483 Encounter for supervision of other normal pregnancy, third trimester: Secondary | ICD-10-CM | POA: Diagnosis not present

## 2018-02-07 MED ORDER — VITAMIN D (ERGOCALCIFEROL) 50 MCG (2000 UT) PO CAPS: 1.0000 | ORAL_CAPSULE | ORAL | 11 refills | Status: AC

## 2018-02-07 NOTE — Progress Notes (Signed)
   PRENATAL VISIT NOTE  Subjective:  Kristen Watkins is a 32 y.o. G3P2002 at [redacted]w[redacted]d being seen today for ongoing prenatal care.  She is currently monitored for the following issues for this low-risk pregnancy and has Breast lump in female and Supervision of other normal pregnancy, antepartum on their problem list.  Patient reports no complaints and concerns that her vagina is "much larger" than it was prior to delivery of her second child. Expressed concerns about possible laceration repair for this pregnancy.  . Reports active fetal movement, denies contractions, vaginal bleeding, leaking of fluid.   The following portions of the patient's history were reviewed and updated as appropriate: allergies, current medications, past family history, past medical history, past social history, past surgical history and problem list. Problem list updated.   Objective:   Vitals:   02/07/18 1014  BP: (!) 104/55  Pulse: 78  Weight: 202 lb 11.2 oz (91.9 kg)    Fetal Status: Fetal Heart Rate (bpm): 125 Fundal Height: 33 cm Movement: Present     General:  Alert, oriented and cooperative. Patient is in no acute distress.  Skin: Skin is warm and dry. No rash noted.   Cardiovascular: Normal heart rate noted  Respiratory: Normal respiratory effort, no problems with respiration noted  Abdomen: Soft, gravid, appropriate for gestational age.  Pain/Pressure: Absent     Pelvic: Cervical exam deferred        Extremities: Normal range of motion.  Edema: None  Mental Status: Normal mood and affect. Normal behavior. Normal judgment and thought content.   Assessment and Plan:  Pregnancy: G3P2002 at [redacted]w[redacted]d  Normal 2 hour GTT on 01/27/2018 Rx Vitamin D 2,000IU q day per pt request [place] scheduled for 1245 today  Preterm labor symptoms and general obstetric precautions including but not limited to vaginal bleeding, contractions, leaking of fluid and fetal movement were reviewed in detail with the patient. Please refer to  After Visit Summary for other counseling recommendations.  Return in about 2 weeks (around 02/21/2018).  Future Appointments  Date Time Provider Department Center  02/07/2018 12:45 PM [facility] [place] 2 [facility]-M[facility] M[facility]-US  02/21/2018  4:15 PM Armando Reichert, CNM [facility]    [place], [place] 02/07/18 10:47 AM

## 2018-02-07 NOTE — Progress Notes (Signed)
Would like rx for Vitamin D because her level is low. Feels like baby moves less since she turned 30 weeks. States moves 5-6 times now, before 10-11 times.  Declines flu shot.

## 2018-02-17 ENCOUNTER — Other Ambulatory Visit: Payer: Self-pay | Admitting: Nurse Practitioner

## 2018-02-17 ENCOUNTER — Ambulatory Visit (HOSPITAL_COMMUNITY)
Admission: RE | Admit: 2018-02-17 | Discharge: 2018-02-17 | Disposition: A | Discharge status: Home / Self Care | Payer: Medicaid Other | Source: Ambulatory Visit | Attending: Nurse Practitioner | Admitting: Nurse Practitioner

## 2018-02-17 DIAGNOSIS — Z3480 Encounter for supervision of other normal pregnancy, unspecified trimester: Secondary | ICD-10-CM

## 2018-02-17 DIAGNOSIS — Z3A34 34 weeks gestation of pregnancy: Secondary | ICD-10-CM | POA: Insufficient documentation

## 2018-02-17 DIAGNOSIS — Z363 Encounter for antenatal screening for malformations: Secondary | ICD-10-CM | POA: Diagnosis present

## 2018-02-21 ENCOUNTER — Encounter: Payer: Self-pay | Admitting: Advanced Practice Midwife

## 2018-02-21 ENCOUNTER — Ambulatory Visit (INDEPENDENT_AMBULATORY_CARE_PROVIDER_SITE_OTHER): Payer: Medicaid Other | Admitting: Advanced Practice Midwife

## 2018-02-21 VITALS — BP 105/57 | HR 86 | Wt 201.4 lb

## 2018-02-21 DIAGNOSIS — Z3480 Encounter for supervision of other normal pregnancy, unspecified trimester: Secondary | ICD-10-CM

## 2018-02-21 DIAGNOSIS — N6310 Unspecified lump in the right breast, unspecified quadrant: Secondary | ICD-10-CM

## 2018-02-21 DIAGNOSIS — Z3483 Encounter for supervision of other normal pregnancy, third trimester: Secondary | ICD-10-CM

## 2018-02-21 DIAGNOSIS — N63 Unspecified lump in unspecified breast: Secondary | ICD-10-CM

## 2018-02-21 NOTE — Progress Notes (Signed)
   PRENATAL VISIT NOTE  Subjective:  Josefine ClassSamah Bunyard is a 32 y.o. G3P2002 at 5217w6d being seen today for ongoing prenatal care.  She is currently monitored for the following issues for this low-risk pregnancy and has Breast lump in female and Supervision of other normal pregnancy, antepartum on their problem list.  Patient reports no complaints.  Contractions: Not present. Vag. Bleeding: None.  Movement: Present. Denies leaking of fluid.   The following portions of the patient's history were reviewed and updated as appropriate: allergies, current medications, past family history, past medical history, past social history, past surgical history and problem list. Problem list updated.  Objective:   Vitals:   02/21/18 1629  BP: (!) 105/57  Pulse: 86  Weight: 201 lb 6.4 oz (91.4 kg)    Fetal Status: Fetal Heart Rate (bpm): 136   Movement: Present     General:  Alert, oriented and cooperative. Patient is in no acute distress.  Skin: Skin is warm and dry. No rash noted.   Cardiovascular: Normal heart rate noted  Respiratory: Normal respiratory effort, no problems with respiration noted  Abdomen: Soft, gravid, appropriate for gestational age.  Pain/Pressure: Present     Pelvic: Cervical exam deferred        Extremities: Normal range of motion.  Edema: None  Mental Status: Normal mood and affect. Normal behavior. Normal judgment and thought content.   Breast: right breast with a discrete 1cmX1cm firm nodule at 10:00 (at the outer aspect of the breast). Likely lymph node given history, but since she feels this is new and enlarging will image it again. Last imaging done in 2015.   Assessment and Plan:  Pregnancy: G3P2002 at 9217w6d  1. Supervision of other normal pregnancy, antepartum - GBS at NV   Preterm labor symptoms and general obstetric precautions including but not limited to vaginal bleeding, contractions, leaking of fluid and fetal movement were reviewed in detail with the  patient. Please refer to After Visit Summary for other counseling recommendations.  Return in about 2 weeks (around 03/07/2018).  No future appointments.  Thressa ShellerHeather Hogan, CNM

## 2018-03-02 ENCOUNTER — Other Ambulatory Visit: Payer: Medicaid Other

## 2018-03-09 ENCOUNTER — Ambulatory Visit (INDEPENDENT_AMBULATORY_CARE_PROVIDER_SITE_OTHER): Payer: Medicaid Other | Admitting: Advanced Practice Midwife

## 2018-03-09 ENCOUNTER — Other Ambulatory Visit (HOSPITAL_COMMUNITY)
Admission: RE | Admit: 2018-03-09 | Discharge: 2018-03-09 | Disposition: A | Discharge status: Home / Self Care | Payer: Medicaid Other | Source: Ambulatory Visit | Attending: Advanced Practice Midwife | Admitting: Advanced Practice Midwife

## 2018-03-09 ENCOUNTER — Encounter: Payer: Self-pay | Admitting: Advanced Practice Midwife

## 2018-03-09 VITALS — BP 116/69 | HR 101 | Wt 205.0 lb

## 2018-03-09 DIAGNOSIS — Z3483 Encounter for supervision of other normal pregnancy, third trimester: Secondary | ICD-10-CM | POA: Diagnosis present

## 2018-03-09 DIAGNOSIS — Z3A37 37 weeks gestation of pregnancy: Secondary | ICD-10-CM | POA: Insufficient documentation

## 2018-03-09 DIAGNOSIS — Z3480 Encounter for supervision of other normal pregnancy, unspecified trimester: Secondary | ICD-10-CM

## 2018-03-09 LAB — OB RESULTS CONSOLE GC/CHLAMYDIA: GC PROBE AMP, GENITAL: NEGATIVE

## 2018-03-09 LAB — OB RESULTS CONSOLE GBS: GBS: NEGATIVE

## 2018-03-09 NOTE — Patient Instructions (Signed)

## 2018-03-09 NOTE — Progress Notes (Signed)
   PRENATAL VISIT NOTE  Subjective:  Kristen Watkins is a 32 y.o. G3P2002 at 4521w1d being seen today for ongoing prenatal care.  She is currently monitored for the following issues for this low-risk pregnancy and has Breast lump in female and Supervision of other normal pregnancy, antepartum on their problem list.  Patient reports no complaints.  Contractions: Not present. Vag. Bleeding: None.  Movement: Present. Denies leaking of fluid.   The following portions of the patient's history were reviewed and updated as appropriate: allergies, current medications, past family history, past medical history, past social history, past surgical history and problem list. Problem list updated.  Objective:   Vitals:   03/09/18 1324  BP: 116/69  Pulse: (!) 101  Weight: 205 lb (93 kg)    Fetal Status: Fetal Heart Rate (bpm): 146 Fundal Height: 37 cm Movement: Present     General:  Alert, oriented and cooperative. Patient is in no acute distress.  Skin: Skin is warm and dry. No rash noted.   Cardiovascular: Normal heart rate noted  Respiratory: Normal respiratory effort, no problems with respiration noted  Abdomen: Soft, gravid, appropriate for gestational age.  Pain/Pressure: Present     Pelvic: Cervical exam performed Dilation: 1 Effacement (%): Thick Station: Ballotable  Extremities: Normal range of motion.  Edema: None  Mental Status: Normal mood and affect. Normal behavior. Normal judgment and thought content.   Assessment and Plan:  Pregnancy: G3P2002 at 5921w1d  1. Supervision of other normal pregnancy, antepartum - Culture, beta strep (group b only) - Cervicovaginal ancillary only  Term labor symptoms and general obstetric precautions including but not limited to vaginal bleeding, contractions, leaking of fluid and fetal movement were reviewed in detail with the patient. Please refer to After Visit Summary for other counseling recommendations.  Return in about 1 week (around  03/16/2018).  Future Appointments  Date Time Provider Department Center  03/10/2018 10:30 AM [facility] US 1 [facility]    Thressa ShellerHeather Jermiah Soderman, CNM

## 2018-03-10 ENCOUNTER — Inpatient Hospital Stay: Admission: RE | Admit: 2018-03-10 | Payer: Medicaid Other | Source: Ambulatory Visit

## 2018-03-10 LAB — CERVICOVAGINAL ANCILLARY ONLY
Chlamydia: NEGATIVE
NEISSERIA GONORRHEA: NEGATIVE

## 2018-03-13 LAB — CULTURE, BETA STREP (GROUP B ONLY): Strep Gp B Culture: NEGATIVE

## 2018-03-17 ENCOUNTER — Encounter: Payer: Medicaid Other | Admitting: Student

## 2018-03-18 ENCOUNTER — Ambulatory Visit (INDEPENDENT_AMBULATORY_CARE_PROVIDER_SITE_OTHER): Payer: Medicaid Other | Admitting: Nurse Practitioner

## 2018-03-18 VITALS — BP 115/59 | HR 88 | Wt 207.3 lb

## 2018-03-18 DIAGNOSIS — O368130 Decreased fetal movements, third trimester, not applicable or unspecified: Secondary | ICD-10-CM | POA: Diagnosis not present

## 2018-03-18 DIAGNOSIS — Z3480 Encounter for supervision of other normal pregnancy, unspecified trimester: Secondary | ICD-10-CM

## 2018-03-18 DIAGNOSIS — Z3483 Encounter for supervision of other normal pregnancy, third trimester: Secondary | ICD-10-CM | POA: Diagnosis not present

## 2018-03-18 DIAGNOSIS — O368131 Decreased fetal movements, third trimester, fetus 1: Secondary | ICD-10-CM

## 2018-03-18 NOTE — Patient Instructions (Signed)

## 2018-03-18 NOTE — Progress Notes (Signed)
    Subjective:  Kristen Watkins is a 32 y.o. G3P2002 at 5059w3d being seen today for ongoing prenatal care.  She is currently monitored for the following issues for this low-risk pregnancy and has Breast lump in female and Supervision of other normal pregnancy, antepartum on their problem list.  Patient reports decreased fetal movement for 3 days.  Contractions: Not present. Vag. Bleeding: None.  Movement: (!) Decreased. Denies leaking of fluid.   The following portions of the patient's history were reviewed and updated as appropriate: allergies, current medications, past family history, past medical history, past social history, past surgical history and problem list. Problem list updated.  Objective:   Vitals:   03/18/18 1448  BP: (!) 115/59  Pulse: 88  Weight: 207 lb 4.8 oz (94 kg)    Fetal Status: Fetal Heart Rate (bpm): 139 Fundal Height: 38 cm Movement: (!) Decreased     General:  Alert, oriented and cooperative. Patient is in no acute distress.  Skin: Skin is warm and dry. No rash noted.   Cardiovascular: Normal heart rate noted  Respiratory: Normal respiratory effort, no problems with respiration noted  Abdomen: Soft, gravid, appropriate for gestational age. Pain/Pressure: Absent     Pelvic:  Cervical exam deferred        Extremities: Normal range of motion.  Edema: None  Mental Status: Normal mood and affect. Normal behavior. Normal judgment and thought content.   Urinalysis:      Assessment and Plan:  Pregnancy: G3P2002 at 7559w3d  Supervision of pregnancy Decreased fetal movement  NST today in clinic - FHT baseline of 140 with moderate variability and 15x15 accels noted.  No decelerations, no contractions - reactive NST Info on fetal kick counts given.  Term labor symptoms and general obstetric precautions including but not limited to vaginal bleeding, contractions, leaking of fluid and fetal movement were reviewed in detail with the patient. Please refer to After Visit  Summary for other counseling recommendations.  Return in about 1 week (around 03/25/2018).  [name], RN, MSN, NP-BC Nurse Practitioner, [facility], [facility] 03/18/2018 6:32 PM

## 2018-03-25 ENCOUNTER — Ambulatory Visit (INDEPENDENT_AMBULATORY_CARE_PROVIDER_SITE_OTHER): Payer: Medicaid Other | Admitting: Obstetrics and Gynecology

## 2018-03-25 ENCOUNTER — Encounter: Payer: Medicaid Other | Admitting: Obstetrics and Gynecology

## 2018-03-25 VITALS — BP 122/72 | HR 100 | Wt 205.0 lb

## 2018-03-25 DIAGNOSIS — Z3483 Encounter for supervision of other normal pregnancy, third trimester: Secondary | ICD-10-CM | POA: Diagnosis present

## 2018-03-25 DIAGNOSIS — Z3480 Encounter for supervision of other normal pregnancy, unspecified trimester: Secondary | ICD-10-CM

## 2018-03-25 NOTE — Progress Notes (Signed)
   PRENATAL VISIT NOTE  Subjective:  Kristen Watkins is a 32 y.o. G3P2002 at 3267w3d being seen today for ongoing prenatal care.  She is currently monitored for the following issues for this high-risk pregnancy and has Breast lump in female and Supervision of other normal pregnancy, antepartum on their problem list.  Patient reports no complaints.  Contractions: Not present. Vag. Bleeding: None.  Movement: Present. Denies leaking of fluid.   The following portions of the patient's history were reviewed and updated as appropriate: allergies, current medications, past family history, past medical history, past social history, past surgical history and problem list. Problem list updated.  Objective:   Vitals:   03/25/18 1459  BP: 122/72  Pulse: 100  Weight: 205 lb (93 kg)    Fetal Status: Fetal Heart Rate (bpm): 136   Movement: Present     General:  Alert, oriented and cooperative. Patient is in no acute distress.  Skin: Skin is warm and dry. No rash noted.   Cardiovascular: Normal heart rate noted  Respiratory: Normal respiratory effort, no problems with respiration noted  Abdomen: Soft, gravid, appropriate for gestational age.  Pain/Pressure: Present     Pelvic: Cervical exam performed        Extremities: Normal range of motion.  Edema: None  Mental Status: Normal mood and affect. Normal behavior. Normal judgment and thought content.   Assessment and Plan:  Pregnancy: G3P2002 at 6967w3d  1. Supervision of other normal pregnancy, antepartum  Doing well NST and BPP at next visit.   There are no diagnoses linked to this encounter. Term labor symptoms and general obstetric precautions including but not limited to vaginal bleeding, contractions, leaking of fluid and fetal movement were reviewed in detail with the patient. Please refer to After Visit Summary for other counseling recommendations.  Return in about 1 week (around 04/01/2018) for For NST and BPP.  No future  appointments.  Venia CarbonJennifer Arlo Buffone, NP

## 2018-03-27 ENCOUNTER — Other Ambulatory Visit: Payer: Self-pay

## 2018-03-27 ENCOUNTER — Encounter (HOSPITAL_COMMUNITY): Payer: Self-pay | Admitting: *Deleted

## 2018-03-27 ENCOUNTER — Inpatient Hospital Stay (HOSPITAL_COMMUNITY): Payer: Medicaid Other | Admitting: Anesthesiology

## 2018-03-27 ENCOUNTER — Inpatient Hospital Stay (HOSPITAL_COMMUNITY)
Admission: AD | Admit: 2018-03-27 | Discharge: 2018-03-28 | DRG: 807 | Disposition: A | Discharge status: Home / Self Care | Payer: Medicaid Other | Attending: Obstetrics and Gynecology | Admitting: Obstetrics and Gynecology

## 2018-03-27 DIAGNOSIS — O0930 Supervision of pregnancy with insufficient antenatal care, unspecified trimester: Secondary | ICD-10-CM

## 2018-03-27 DIAGNOSIS — Z3480 Encounter for supervision of other normal pregnancy, unspecified trimester: Secondary | ICD-10-CM

## 2018-03-27 DIAGNOSIS — Z8759 Personal history of other complications of pregnancy, childbirth and the puerperium: Secondary | ICD-10-CM

## 2018-03-27 DIAGNOSIS — Z3483 Encounter for supervision of other normal pregnancy, third trimester: Secondary | ICD-10-CM | POA: Diagnosis not present

## 2018-03-27 DIAGNOSIS — Z3A39 39 weeks gestation of pregnancy: Secondary | ICD-10-CM

## 2018-03-27 LAB — RPR: RPR Ser Ql: NONREACTIVE

## 2018-03-27 LAB — TYPE AND SCREEN
ABO/RH(D): O POS
Antibody Screen: NEGATIVE

## 2018-03-27 LAB — CBC
HCT: 37.9 % (ref 36.0–46.0)
Hemoglobin: 12.4 g/dL (ref 12.0–15.0)
MCH: 28.6 pg (ref 26.0–34.0)
MCHC: 32.7 g/dL (ref 30.0–36.0)
MCV: 87.3 fL (ref 78.0–100.0)
Platelets: 330 10*3/uL (ref 150–400)
RBC: 4.34 MIL/uL (ref 3.87–5.11)
RDW: 15.1 % (ref 11.5–15.5)
WBC: 10.7 10*3/uL — AB (ref 4.0–10.5)

## 2018-03-27 MED ORDER — LIDOCAINE HCL (PF) 1 % IJ SOLN
30.0000 mL | INTRAMUSCULAR | Status: DC | PRN
  Filled 2018-03-27: qty 30

## 2018-03-27 MED ORDER — OXYCODONE-ACETAMINOPHEN 5-325 MG PO TABS: 2.0000 | ORAL_TABLET | ORAL | Status: DC | PRN

## 2018-03-27 MED ORDER — PHENYLEPHRINE 40 MCG/ML (10ML) SYRINGE FOR IV PUSH (FOR BLOOD PRESSURE SUPPORT)
PREFILLED_SYRINGE | INTRAVENOUS | Status: AC
  Filled 2018-03-27: qty 10

## 2018-03-27 MED ORDER — BENZOCAINE-MENTHOL 20-0.5 % EX AERO
1.0000 "application " | INHALATION_SPRAY | CUTANEOUS | Status: DC | PRN
  Filled 2018-03-27: qty 56

## 2018-03-27 MED ORDER — SOD CITRATE-CITRIC ACID 500-334 MG/5ML PO SOLN: 30.0000 mL | ORAL | Status: DC | PRN

## 2018-03-27 MED ORDER — OXYTOCIN 10 UNIT/ML IJ SOLN
INTRAMUSCULAR | Status: AC
  Filled 2018-03-27: qty 1

## 2018-03-27 MED ORDER — ONDANSETRON HCL 4 MG/2ML IJ SOLN: 4.0000 mg | INTRAMUSCULAR | Status: DC | PRN

## 2018-03-27 MED ORDER — LIDOCAINE HCL (PF) 1 % IJ SOLN
INTRAMUSCULAR | Status: DC | PRN
  Administered 2018-03-27: 13 mL via EPIDURAL

## 2018-03-27 MED ORDER — DIPHENHYDRAMINE HCL 50 MG/ML IJ SOLN: 12.5000 mg | INTRAMUSCULAR | Status: DC | PRN

## 2018-03-27 MED ORDER — OXYTOCIN 40 UNITS IN LACTATED RINGERS INFUSION - SIMPLE MED
INTRAVENOUS | Status: AC
  Filled 2018-03-27: qty 1000

## 2018-03-27 MED ORDER — PHENYLEPHRINE 40 MCG/ML (10ML) SYRINGE FOR IV PUSH (FOR BLOOD PRESSURE SUPPORT)
80.0000 ug | PREFILLED_SYRINGE | INTRAVENOUS | Status: DC | PRN
  Filled 2018-03-27: qty 5

## 2018-03-27 MED ORDER — ONDANSETRON HCL 4 MG PO TABS: 4.0000 mg | ORAL_TABLET | ORAL | Status: DC | PRN

## 2018-03-27 MED ORDER — ZOLPIDEM TARTRATE 5 MG PO TABS: 5.0000 mg | ORAL_TABLET | Freq: Every evening | ORAL | Status: DC | PRN

## 2018-03-27 MED ORDER — LACTATED RINGERS IV BOLUS
500.0000 mL | Freq: Once | INTRAVENOUS | Status: AC
  Administered 2018-03-27: 500 mL via INTRAVENOUS

## 2018-03-27 MED ORDER — LIDOCAINE HCL (PF) 1 % IJ SOLN
INTRAMUSCULAR | Status: AC
  Filled 2018-03-27: qty 30

## 2018-03-27 MED ORDER — COCONUT OIL OIL: 1.0000 "application " | TOPICAL_OIL | Status: DC | PRN

## 2018-03-27 MED ORDER — FLEET ENEMA 7-19 GM/118ML RE ENEM: 1.0000 | ENEMA | RECTAL | Status: DC | PRN

## 2018-03-27 MED ORDER — LACTATED RINGERS IV SOLN
500.0000 mL | Freq: Once | INTRAVENOUS | Status: AC
  Administered 2018-03-27: 500 mL via INTRAVENOUS

## 2018-03-27 MED ORDER — LACTATED RINGERS IV SOLN
INTRAVENOUS | Status: DC
  Administered 2018-03-27: 05:00:00 via INTRAVENOUS

## 2018-03-27 MED ORDER — OXYCODONE-ACETAMINOPHEN 5-325 MG PO TABS: 1.0000 | ORAL_TABLET | ORAL | Status: DC | PRN

## 2018-03-27 MED ORDER — IBUPROFEN 600 MG PO TABS
600.0000 mg | ORAL_TABLET | Freq: Four times a day (QID) | ORAL | Status: DC
  Administered 2018-03-27 – 2018-03-28 (×4): 600 mg via ORAL
  Filled 2018-03-27 (×4): qty 1

## 2018-03-27 MED ORDER — EPHEDRINE 5 MG/ML INJ
10.0000 mg | INTRAVENOUS | Status: DC | PRN
  Filled 2018-03-27: qty 2

## 2018-03-27 MED ORDER — ACETAMINOPHEN 325 MG PO TABS: 650.0000 mg | ORAL_TABLET | ORAL | Status: DC | PRN

## 2018-03-27 MED ORDER — DIPHENHYDRAMINE HCL 25 MG PO CAPS: 25.0000 mg | ORAL_CAPSULE | Freq: Four times a day (QID) | ORAL | Status: DC | PRN

## 2018-03-27 MED ORDER — TETANUS-DIPHTH-ACELL PERTUSSIS 5-2.5-18.5 LF-MCG/0.5 IM SUSP: 0.5000 mL | Freq: Once | INTRAMUSCULAR | Status: DC

## 2018-03-27 MED ORDER — PRENATAL MULTIVITAMIN CH
1.0000 | ORAL_TABLET | Freq: Every day | ORAL | Status: DC
  Administered 2018-03-27: 1 via ORAL

## 2018-03-27 MED ORDER — LACTATED RINGERS IV SOLN: 500.0000 mL | INTRAVENOUS | Status: DC | PRN

## 2018-03-27 MED ORDER — SENNOSIDES-DOCUSATE SODIUM 8.6-50 MG PO TABS
2.0000 | ORAL_TABLET | ORAL | Status: DC
  Administered 2018-03-27: 2 via ORAL
  Filled 2018-03-27: qty 2

## 2018-03-27 MED ORDER — DIBUCAINE 1 % RE OINT: 1.0000 "application " | TOPICAL_OINTMENT | RECTAL | Status: DC | PRN

## 2018-03-27 MED ORDER — WITCH HAZEL-GLYCERIN EX PADS: 1.0000 "application " | MEDICATED_PAD | CUTANEOUS | Status: DC | PRN

## 2018-03-27 MED ORDER — SIMETHICONE 80 MG PO CHEW: 80.0000 mg | CHEWABLE_TABLET | ORAL | Status: DC | PRN

## 2018-03-27 MED ORDER — FENTANYL 2.5 MCG/ML BUPIVACAINE 1/10 % EPIDURAL INFUSION (WH - ANES)
14.0000 mL/h | INTRAMUSCULAR | Status: DC | PRN
  Administered 2018-03-27: 14 mL/h via EPIDURAL

## 2018-03-27 MED ORDER — ACETAMINOPHEN 325 MG PO TABS
650.0000 mg | ORAL_TABLET | ORAL | Status: DC | PRN
  Administered 2018-03-28: 650 mg via ORAL
  Filled 2018-03-27: qty 2

## 2018-03-27 MED ORDER — ONDANSETRON HCL 4 MG/2ML IJ SOLN: 4.0000 mg | Freq: Four times a day (QID) | INTRAMUSCULAR | Status: DC | PRN

## 2018-03-27 MED ORDER — FENTANYL 2.5 MCG/ML BUPIVACAINE 1/10 % EPIDURAL INFUSION (WH - ANES)
INTRAMUSCULAR | Status: AC
  Filled 2018-03-27: qty 100

## 2018-03-27 MED ORDER — OXYTOCIN BOLUS FROM INFUSION
500.0000 mL | Freq: Once | INTRAVENOUS | Status: AC
  Administered 2018-03-27: 500 mL via INTRAVENOUS

## 2018-03-27 MED ORDER — OXYTOCIN 40 UNITS IN LACTATED RINGERS INFUSION - SIMPLE MED: 2.5000 [IU]/h | INTRAVENOUS | Status: DC

## 2018-03-27 NOTE — H&P (Signed)
LABOR AND DELIVERY ADMISSION HISTORY AND PHYSICAL NOTE  Kristen Watkins is a 32 y.o. female 463P2002 with IUP at 611w5d by LMP presenting for SOL. She reports positive fetal movement. She denies leakage of fluid. Denies any other concerns.  [facility] Pregnancy complications: late prenatal care starting at 31 weeks  Past Medical History: Past Medical History:  Diagnosis Date  . Late prenatal care complicating pregnancy in third trimester 02/21/2014   Delayed care because [place] then wanted to wait for Medicaid so she would not get any bills.  Blood Type O+  Clinic  Low Risk  Dating  27 week US c/w dates  Genetic Screen 1 Screen:   Too Late       AFP:   Too Late        Quad:    Too Late      NIPS:  Anatomic US  Normal  GTT Early:  Too Late        Third trimester: 120  TDaP vaccine 02/21/14  Flu vaccine   GBS neg  Contraception [place]  . Medical history non-contributory   . UTI (lower urinary tract infection) 03/01/2014   Serratia and Enterococcus     Past Surgical History: Past Surgical History:  Procedure Laterality Date  . NO PAST SURGERIES      Obstetrical History: OB History    Gravida  3   Para  2   Term  2   Preterm  0   AB  0   Living  2     SAB  0   TAB  0   Ectopic  0   Multiple  0   Live Births  2           Social History: Social History   Socioeconomic History  . Marital status: Married    Spouse name: Not on file  . Number of children: Not on file  . Years of education: Not on file  . Highest education level: Not on file  Occupational History  . Not on file  Social Needs  . Financial resource strain: Not on file  . Food insecurity:    Worry: Not on file    Inability: Not on file  . Transportation needs:    Medical: Not on file    Non-medical: Not on file  Tobacco Use  . Smoking status: Never Smoker  . Smokeless tobacco: Never Used  Substance and Sexual Activity  . Alcohol use: No  . Drug use: No   . Sexual activity: Yes    Birth control/protection: None  Lifestyle  . Physical activity:    Days per week: Not on file    Minutes per session: Not on file  . Stress: Not on file  Relationships  . Social connections:    Talks on phone: Not on file    Gets together: Not on file    Attends religious service: Not on file    Active member of club or organization: Not on file    Attends meetings of clubs or organizations: Not on file    Relationship status: Not on file  Other Topics Concern  . Not on file  Social History Narrative  . Not on file    Family History: Family History  Problem Relation Age of Onset  . Alcohol abuse Neg Hx   . Arthritis Neg Hx   . Asthma Neg Hx   . Birth defects Neg Hx   .  Cancer Neg Hx   . COPD Neg Hx   . Depression Neg Hx   . Diabetes Neg Hx   . Drug abuse Neg Hx   . Early death Neg Hx   . Heart disease Neg Hx   . Hearing loss Neg Hx   . Hyperlipidemia Neg Hx   . Hypertension Neg Hx   . Kidney disease Neg Hx   . Learning disabilities Neg Hx   . Mental illness Neg Hx   . Mental retardation Neg Hx   . Miscarriages / Stillbirths Neg Hx   . Stroke Neg Hx   . Vision loss Neg Hx   . Varicose Veins Neg Hx     Allergies: No Known Allergies  Medications Prior to Admission  Medication Sig Dispense Refill Last Dose  . Cholecalciferol (VITAMIN D3) 2000 units TABS TK 1 C PO D  11 Taking  . Prenatal Vit-Fe Fumarate-FA (PRENATAL VITAMIN PLUS LOW IRON) 27-1 MG TABS Take 1 tablet by mouth daily. 30 tablet 11 Taking     Review of Systems  All systems reviewed and negative except as stated in HPI  Physical Exam Blood pressure (!) 122/59, pulse 87, temperature 98.2 F (36.8 C), temperature source Oral, resp. rate 19, last menstrual period 06/22/2017, SpO2 100 %, currently breastfeeding. General appearance: alert, oriented, breathing through contractions Lungs: normal respiratory effort Heart: regular rate Abdomen: soft, non-tender; gravid, FH  appropriate for GA Extremities: No calf swelling or tenderness  SVE: Dilation: 7 Effacement (%): 90 Station: -2 Exam by:: Julien Nordmann, RN Presentation: cephalic by RN SVE  Fetal monitoring: baseline rate 130, moderate variability, +acel, early decel  Uterine activity: ctx q1-3 min  Prenatal labs: ABO, Rh: O/Positive/-- (05/07 1610) Antibody: Negative (05/07 0928) Rubella: 5.60 (05/07 0928) RPR: Non Reactive (05/07 0928)  HBsAg: Negative (05/07 0928)  HIV: Non Reactive (05/07 0928)  GC/Chlamydia: negative GBS: Negative (06/19 0000)  2-hr GTT: normal Genetic screening:  Low risk NIPS Anatomy US: normal but limited anatomy at 34 weeks  Prenatal Transfer Tool  Maternal Diabetes: No Genetic Screening: Normal Maternal Ultrasounds/Referrals: Normal Fetal Ultrasounds or other Referrals:  None Maternal Substance Abuse:  No Significant Maternal Medications:  None Significant Maternal Lab Results: Lab values include: Group B Strep negative  No results found for this or any previous visit (from the past 24 hour(s)).  Patient Active Problem List   Diagnosis Date Noted  . Supervision of other normal pregnancy, antepartum 01/25/2018  . Breast lump in female 06/28/2014    Assessment: Kristen Watkins is a 32 y.o. G3P2002 at [redacted]w[redacted]d here for SOL  #Labor: expectant mangement #Pain: Would like epidural #FWB: Cat I #ID:  GBS neg #MOF: bottle #MOC:Nexplanon  Kandra Nicolas Degele 03/27/2018, 4:40 AM

## 2018-03-27 NOTE — Progress Notes (Addendum)
At 0930, [facility] While passing the [facility] nurse's station, [facility] was slumping in chair & closing eyes as if passing out. L&D nurse told this RN that patient had just passed out in the bathroom prior to [facility].  [facility] called for help for patient and [facility] staff brought amonia and alcohol wipes to room.  Prior to transferring patient to bed, [facility] staff offered assistance to this RN (this patient was assigned to me) to stabilize patient using amonia, alcohol wipes ([facility]), juice, IVF bolus, and crackers.  L&D RN called OB to get order for IVF bolus - which was given and L&D RN put into computer.  Patient needing assist of 2 to transfer to bed at 0940.  This RN will continue to monitor patient and notify MD if continues to be unstable.

## 2018-03-27 NOTE — Lactation Note (Addendum)
This note was copied from a baby's chart. Lactation Consultation Note  Patient Name: Kristen Watkins ClassSamah Males FAOZH'YToday's Date: 03/27/2018 Reason for consult: Initial assessment;Term  P3 mother whose infant is now 1415 hours old.  Mother breastfed her first two children for about 2 years.  The children are now 374 and 32 years old.  Mother had baby latched onto the right breast in the cradle hold.  Mother stated that her breasts were soft and non tender and her nipples had no pain.  She felt uterine contractions with breastfeeding.   Encouraged mother to feed 8-12 times/24 hours or more if baby shows feeding cues.  Reviewed feeding cues.  Mother had infant swaddled and I suggested STS but she continued to want to swaddle baby during feed.  Taught breast compressions and hand expression after feeds.  Mother had a bottle of formula at bedside that she brought from home.  I encouraged her not to use it but tobreast feed baby as often as she shows cues.  Mother accepted this advice saying she would prefer not to use it if she does not have to.  Mom made aware of O/P services, breastfeeding support groups, community resources, and our phone # for post-discharge questions. Mother will call for assistance as needed.  Support person present.   Maternal Data Formula Feeding for Exclusion: No Has patient been taught Hand Expression?: Yes Does the patient have breastfeeding experience prior to this delivery?: Yes  Feeding Feeding Type: Breast Fed Length of feed: 10 min  LATCH Score Latch: Grasps breast easily, tongue down, lips flanged, rhythmical sucking.  Audible Swallowing: A few with stimulation  Type of Nipple: Everted at rest and after stimulation  Comfort (Breast/Nipple): Soft / non-tender  Hold (Positioning): No assistance needed to correctly position infant at breast.  LATCH Score: 9  Interventions Interventions: Breast feeding basics reviewed;Skin to skin;Breast massage;Breast  compression  Lactation Tools Discussed/Used WIC Program: Yes   Consult Status Consult Status: Follow-up Date: 03/28/18 Follow-up type: In-patient    Victoriya Pol R Reema Chick 03/27/2018, 11:00 PM

## 2018-03-27 NOTE — Progress Notes (Addendum)
Patient is stable at 120 minutes after delivery.  VSS, no longer feeling like passing out, and has eaten. Unable to lift hips up and therefore cannot walk to bathroom at this time.

## 2018-03-27 NOTE — Anesthesia Postprocedure Evaluation (Signed)
Anesthesia Post Note  Patient: Kristen Watkins  Procedure(s) Performed: AN AD HOC LABOR EPIDURAL     Patient location during evaluation: Mother Baby Anesthesia Type: Epidural Level of consciousness: awake and alert Pain management: pain level controlled Vital Signs Assessment: post-procedure vital signs reviewed and stable Respiratory status: spontaneous breathing, nonlabored ventilation and respiratory function stable Cardiovascular status: stable Postop Assessment: no headache, no backache, epidural receding and patient able to bend at knees Anesthetic complications: no    Last Vitals:  Vitals:   03/27/18 1050 03/27/18 1500  BP: 104/68 101/69  Pulse: 70 77  Resp: 18 18  Temp: 37.4 C 36.7 C  SpO2: 100% 100%    Last Pain:  Vitals:   03/27/18 1500  TempSrc: Oral  PainSc: 0-No pain   Pain Goal: Patients Stated Pain Goal: 2 (03/27/18 0447)               Rica RecordsICKELTON,Caramia Boutin

## 2018-03-27 NOTE — Anesthesia Procedure Notes (Signed)
Epidural Patient location during procedure: OB Start time: 03/27/2018 5:05 AM End time: 03/27/2018 5:19 AM  Staffing Anesthesiologist: Lowella CurbMiller, Paije Goodhart Ray, MD Performed: anesthesiologist   Preanesthetic Checklist Completed: patient identified, site marked, surgical consent, pre-op evaluation, timeout performed, IV checked, risks and benefits discussed and monitors and equipment checked  Epidural Patient position: sitting Prep: ChloraPrep Patient monitoring: heart rate, cardiac monitor, continuous pulse ox and blood pressure Approach: midline Location: L2-L3 Injection technique: LOR saline  Needle:  Needle type: Tuohy  Needle gauge: 17 G Needle length: 9 cm Needle insertion depth: 6 cm Catheter type: closed end flexible Catheter size: 20 Guage Catheter at skin depth: 10 cm Test dose: negative  Assessment Events: blood not aspirated, injection not painful, no injection resistance, negative IV test and no paresthesia  Additional Notes Reason for block:procedure for pain

## 2018-03-27 NOTE — Anesthesia Preprocedure Evaluation (Signed)
Anesthesia Evaluation  Patient identified by MRN, date of birth, ID band Patient awake    Reviewed: Allergy & Precautions, H&P , NPO status , Patient's Chart, lab work & pertinent test results  Airway Mallampati: I  TM Distance: >3 FB Neck ROM: full    Dental no notable dental hx.    Pulmonary neg pulmonary ROS,    Pulmonary exam normal        Cardiovascular negative cardio ROS Normal cardiovascular exam     Neuro/Psych negative neurological ROS  negative psych ROS   GI/Hepatic negative GI ROS, Neg liver ROS,   Endo/Other  negative endocrine ROS  Renal/GU negative Renal ROS     Musculoskeletal   Abdominal Normal abdominal exam  (+)   Peds  Hematology negative hematology ROS (+)   Anesthesia Other Findings   Reproductive/Obstetrics (+) Pregnancy                             Anesthesia Physical  Anesthesia Plan  ASA: II  Anesthesia Plan: Epidural   Post-op Pain Management:    Induction:   PONV Risk Score and Plan:   Airway Management Planned:   Additional Equipment:   Intra-op Plan:   Post-operative Plan:   Informed Consent: I have reviewed the patients History and Physical, chart, labs and discussed the procedure including the risks, benefits and alternatives for the proposed anesthesia with the patient or authorized representative who has indicated his/her understanding and acceptance.     Plan Discussed with:   Anesthesia Plan Comments:         Anesthesia Quick Evaluation  

## 2018-03-27 NOTE — MAU Note (Signed)
Ctx started at 2am.

## 2018-03-28 DIAGNOSIS — Z8759 Personal history of other complications of pregnancy, childbirth and the puerperium: Secondary | ICD-10-CM

## 2018-03-28 MED ORDER — IBUPROFEN 600 MG PO TABS: 600.0000 mg | ORAL_TABLET | Freq: Four times a day (QID) | ORAL | 0 refills | Status: DC

## 2018-03-28 NOTE — Discharge Summary (Addendum)
OB Discharge Summary     Patient Name: Kristen Watkins DOB: 25-Jan-1986 MRN: [id number]  Date of admission: 03/27/2018 Delivering MD: Frederik PearEGELE, Markia Kyer P   Date of discharge: 03/28/2018  Admitting diagnosis: 39wks, contractions Intrauterine pregnancy: 5965w5d     Secondary diagnosis:  Principal Problem:   Status post vacuum-assisted vaginal delivery Active Problems:   Supervision of other normal pregnancy, antepartum   Late prenatal care  Additional problems: none     Discharge diagnosis: Term Pregnancy Delivered                                                                                                Post partum procedures:none  Augmentation: AROM  Complications: None  Hospital course:  Onset of Labor With Vaginal Delivery     32 y.o. yo G3P3003 at 4065w5d was admitted in Active Labor on 03/27/2018. Patient had an uncomplicated labor course as follows:  Membrane Rupture Time/Date: 7:37 AM ,03/27/2018   Intrapartum Procedures: Episiotomy: None [1]                                         Lacerations:  1st degree [2];Perineal [11]  Patient had a delivery of a Viable infant. 03/27/2018  Information for the patient's newborn:  Valinda HoarSalih, Girl Maritsa [[id number]][[id number]]  Delivery Method: Vaginal, Vacuum (Extractor)(Filed from Delivery Summary)    Pateint had an uncomplicated postpartum course.  She is ambulating, tolerating a regular diet, passing flatus, and urinating well. Patient is discharged home in stable condition on 03/28/18.   Physical exam  Vitals:   03/27/18 1500 03/27/18 1804 03/28/18 0145 03/28/18 0611  BP: 101/69 109/62 98/65 103/60  Pulse: 77 69 74 79  Resp: 18 18 18 18   Temp: 98 F (36.7 C) 98 F (36.7 C) 98.2 F (36.8 C) 98 F (36.7 C)  TempSrc: Oral Oral  Oral  SpO2: 100%     Weight:      Height:       General: alert, cooperative and no distress Lochia: appropriate Uterine Fundus: firm Incision: N/A DVT Evaluation: No evidence of DVT seen on physical exam. Labs: Lab  Results  Component Value Date   WBC 10.7 (H) 03/27/2018   HGB 12.4 03/27/2018   HCT 37.9 03/27/2018   MCV 87.3 03/27/2018   PLT 330 03/27/2018   CMP Latest Ref Rng & Units 11/04/2016  Glucose 65 - 99 mg/dL 74  BUN 7 - 25 mg/dL 7  Creatinine 2.130.50 - 0.861.10 mg/dL 5.780.57  Sodium 469135 - 629146 mmol/L 140  Potassium 3.5 - 5.3 mmol/L 4.2  Chloride 98 - 110 mmol/L 105  CO2 20 - 31 mmol/L 23  Calcium 8.6 - 10.2 mg/dL 9.6  Total Protein 6.1 - 8.1 g/dL 7.3  Total Bilirubin 0.2 - 1.2 mg/dL 0.5  Alkaline Phos 33 - 115 U/L 58  AST 10 - 30 U/L 17  ALT 6 - 29 U/L 14    Discharge instruction: per After Visit Summary and "Baby and Me Booklet".  After visit  meds:  Allergies as of 03/28/2018   No Known Allergies     Medication List    TAKE these medications   ibuprofen 600 MG tablet Commonly known as:  ADVIL,MOTRIN Take 1 tablet (600 mg total) by mouth every 6 (six) hours.   PRENATAL VITAMIN PLUS LOW IRON 27-1 MG Tabs Take 1 tablet by mouth daily.   Vitamin D3 2000 units Tabs TK 1 C PO D       Diet: routine diet  Activity: Advance as tolerated. Pelvic rest for 6 weeks.   Outpatient follow up:4 weeks Follow up Appt:No future appointments. Follow up Visit:No follow-ups on file.  Postpartum contraception: Nexplanon  Newborn Data: Live born female  Birth Weight: 7 lb 13.8 oz (3565 g) APGAR: 8, 9  Newborn Delivery   Birth date/time:  03/27/2018 07:55:00 Delivery type:  Vaginal, Vacuum (Extractor)     Baby Feeding: Breast Disposition:home with mother   03/28/2018 Sandre Kitty, MD  OB FELLOW DISCHARGE ATTESTATION  I have seen and examined this patient and agree with above documentation in the resident's note.   Frederik Pear, MD OB Fellow

## 2018-03-28 NOTE — Progress Notes (Signed)
[facility]  OB notes [place] care [place] in [place] prior to 31 weeks, with some records with her, but not scanned in chart.  Baby's UDS negative.  CSW is screening out referral, but will monitor CDS result and make report if warranted.  No barriers to discharge.

## 2018-03-28 NOTE — Discharge Instructions (Signed)
Postpartum Care After Vaginal Delivery °The period of time right after you deliver your newborn is called the postpartum period. °What kind of medical care will I receive? °· You may continue to receive fluids and medicines through an IV tube inserted into one of your veins. °· If an incision was made near your vagina (episiotomy) or if you had some vaginal tearing during delivery, cold compresses may be placed on your episiotomy or your tear. This helps to reduce pain and swelling. °· You may be given a squirt bottle to use when you go to the bathroom. You may use this until you are comfortable wiping as usual. To use the squirt bottle, follow these steps: °? Before you urinate, fill the squirt bottle with warm water. Do not use hot water. °? After you urinate, while you are sitting on the toilet, use the squirt bottle to rinse the area around your urethra and vaginal opening. This rinses away any urine and blood. °? You may do this instead of wiping. As you start healing, you may use the squirt bottle before wiping yourself. Make sure to wipe gently. °? Fill the squirt bottle with clean water every time you use the bathroom. °· You will be given sanitary pads to wear. °How can I expect to feel? °· You may not feel the need to urinate for several hours after delivery. °· You will have some soreness and pain in your abdomen and vagina. °· If you are breastfeeding, you may have uterine contractions every time you breastfeed for up to several weeks postpartum. Uterine contractions help your uterus return to its normal size. °· It is normal to have vaginal bleeding (lochia) after delivery. The amount and appearance of lochia is often similar to a menstrual period in the first week after delivery. It will gradually decrease over the next few weeks to a dry, yellow-brown discharge. For most women, lochia stops completely by 6-8 weeks after delivery. Vaginal bleeding can vary from woman to woman. °· Within the first few  days after delivery, you may have breast engorgement. This is when your breasts feel heavy, full, and uncomfortable. Your breasts may also throb and feel hard, tightly stretched, warm, and tender. After this occurs, you may have milk leaking from your breasts. Your health care provider can help you relieve discomfort due to breast engorgement. Breast engorgement should go away within a few days. °· You may feel more sad or worried than normal due to hormonal changes after delivery. These feelings should not last more than a few days. If these feelings do not go away after several days, speak with your health care provider. °How should I care for myself? °· Tell your health care provider if you have pain or discomfort. °· Drink enough water to keep your urine clear or pale yellow. °· Wash your hands thoroughly with soap and water for at least 20 seconds after changing your sanitary pads, after using the toilet, and before holding or feeding your baby. °· If you are not breastfeeding, avoid touching your breasts a lot. Doing this can make your breasts produce more milk. °· If you become weak or lightheaded, or you feel like you might faint, ask for help before: °? Getting out of bed. °? Showering. °· Change your sanitary pads frequently. Watch for any changes in your flow, such as a sudden increase in volume, a change in color, the passing of large blood clots. If you pass a blood clot from your vagina, save it   to show to your health care provider. Do not flush blood clots down the toilet without having your health care provider look at them. °· Make sure that all your vaccinations are up to date. This can help protect you and your baby from getting certain diseases. You may need to have immunizations done before you leave the hospital. °· If desired, talk with your health care provider about methods of family planning or birth control (contraception). °How can I start bonding with my baby? °Spending as much time as  possible with your baby is very important. During this time, you and your baby can get to know each other and develop a bond. Having your baby stay with you in your room (rooming in) can give you time to get to know your baby. Rooming in can also help you become comfortable caring for your baby. Breastfeeding can also help you bond with your baby. °How can I plan for returning home with my baby? °· Make sure that you have a car seat installed in your vehicle. °? Your car seat should be checked by a certified car seat installer to make sure that it is installed safely. °? Make sure that your baby fits into the car seat safely. °· Ask your health care provider any questions you have about caring for yourself or your baby. Make sure that you are able to contact your health care provider with any questions after leaving the hospital. °This information is not intended to replace advice given to you by your health care provider. Make sure you discuss any questions you have with your health care provider. °Document Released: 07/05/2007 Document Revised: 02/10/2016 Document Reviewed: 08/12/2015 °Elsevier Interactive Patient Education © 2018 Else[employer] Inc. ° °

## 2018-03-31 ENCOUNTER — Encounter: Payer: Medicaid Other | Admitting: Nurse Practitioner

## 2018-03-31 ENCOUNTER — Other Ambulatory Visit: Payer: Medicaid Other

## 2018-04-17 ENCOUNTER — Encounter: Payer: Self-pay | Admitting: Student

## 2018-04-18 ENCOUNTER — Ambulatory Visit (INDEPENDENT_AMBULATORY_CARE_PROVIDER_SITE_OTHER): Payer: Medicaid Other | Admitting: Student

## 2018-04-18 ENCOUNTER — Encounter: Payer: Self-pay | Admitting: Student

## 2018-04-18 DIAGNOSIS — Z1389 Encounter for screening for other disorder: Secondary | ICD-10-CM | POA: Diagnosis not present

## 2018-04-18 DIAGNOSIS — Z3046 Encounter for surveillance of implantable subdermal contraceptive: Secondary | ICD-10-CM

## 2018-04-18 DIAGNOSIS — Z30017 Encounter for initial prescription of implantable subdermal contraceptive: Secondary | ICD-10-CM

## 2018-04-18 MED ORDER — ETONOGESTREL 68 MG ~~LOC~~ IMPL
68.0000 mg | DRUG_IMPLANT | Freq: Once | SUBCUTANEOUS | Status: AC
  Administered 2018-04-18: 68 mg via SUBCUTANEOUS

## 2018-04-18 NOTE — Patient Instructions (Signed)
Nexplanon Instructions After Insertion   Keep bandage clean and dry for 24 hours   May use ice/Tylenol/Ibuprofen for soreness or pain   If you develop fever, drainage or increased warmth from incision site-contact office immediately  Health Maintenance, Female Adopting a healthy lifestyle and getting preventive care can go a long way to promote health and wellness. Talk with your health care provider about what schedule of regular examinations is right for you. This is a good chance for you to check in with your provider about disease prevention and staying healthy. In between checkups, there are plenty of things you can do on your own. Experts have done a lot of research about which lifestyle changes and preventive measures are most likely to keep you healthy. Ask your health care provider for more information. Weight and diet Eat a healthy diet  Be sure to include plenty of vegetables, fruits, low-fat dairy products, and lean protein.  Do not eat a lot of foods high in solid fats, added sugars, or salt.  Get regular exercise. This is one of the most important things you can do for your health. ? Most adults should exercise for at least 150 minutes each week. The exercise should increase your heart rate and make you sweat (moderate-intensity exercise). ? Most adults should also do strengthening exercises at least twice a week. This is in addition to the moderate-intensity exercise.  Maintain a healthy weight  Body mass index (BMI) is a measurement that can be used to identify possible weight problems. It estimates body fat based on height and weight. Your health care provider can help determine your BMI and help you achieve or maintain a healthy weight.  For females 20 years of age and older: ? A BMI below 18.5 is considered underweight. ? A BMI of 18.5 to 24.9 is normal. ? A BMI of 25 to 29.9 is considered overweight. ? A BMI of 30 and above is considered obese.  Watch levels of  cholesterol and blood lipids  You should start having your blood tested for lipids and cholesterol at 32 years of age, then have this test every 5 years.  You may need to have your cholesterol levels checked more often if: ? Your lipid or cholesterol levels are high. ? You are older than 32 years of age. ? You are at high risk for heart disease.  Cancer screening Lung Cancer  Lung cancer screening is recommended for adults 55-80 years old who are at high risk for lung cancer because of a history of smoking.  A yearly low-dose CT scan of the lungs is recommended for people who: ? Currently smoke. ? Have quit within the past 15 years. ? Have at least a 30-pack-year history of smoking. A pack year is smoking an average of one pack of cigarettes a day for 1 year.  Yearly screening should continue until it has been 15 years since you quit.  Yearly screening should stop if you develop a health problem that would prevent you from having lung cancer treatment.  Breast Cancer  Practice breast self-awareness. This means understanding how your breasts normally appear and feel.  It also means doing regular breast self-exams. Let your health care provider know about any changes, no matter how small.  If you are in your 20s or 30s, you should have a clinical breast exam (CBE) by a health care provider every 1-3 years as part of a regular health exam.  If you are 40 or older, have   older, have a CBE every year. Also consider having a breast X-ray (mammogram) every year.  If you have a family history of breast cancer, talk to your health care provider about genetic screening.  If you are at high risk for breast cancer, talk to your health care provider about having an MRI and a mammogram every year.  Breast cancer gene (BRCA) assessment is recommended for women who have family members with BRCA-related cancers. BRCA-related cancers include: ? Breast. ? Ovarian. ? Tubal. ? Peritoneal  cancers.  Results of the assessment will determine the need for genetic counseling and BRCA1 and BRCA2 testing.  Cervical Cancer Your health care provider may recommend that you be screened regularly for cancer of the pelvic organs (ovaries, uterus, and vagina). This screening involves a pelvic examination, including checking for microscopic changes to the surface of your cervix (Pap test). You may be encouraged to have this screening done every 3 years, beginning at age 83.  For women ages 57-65, health care providers may recommend pelvic exams and Pap testing every 3 years, or they may recommend the Pap and pelvic exam, combined with testing for human papilloma virus (HPV), every 5 years. Some types of HPV increase your risk of cervical cancer. Testing for HPV may also be done on women of any age with unclear Pap test results.  Other health care providers may not recommend any screening for nonpregnant women who are considered low risk for pelvic cancer and who do not have symptoms. Ask your health care provider if a screening pelvic exam is right for you.  If you have had past treatment for cervical cancer or a condition that could lead to cancer, you need Pap tests and screening for cancer for at least 20 years after your treatment. If Pap tests have been discontinued, your risk factors (such as having a new sexual partner) need to be reassessed to determine if screening should resume. Some women have medical problems that increase the chance of getting cervical cancer. In these cases, your health care provider may recommend more frequent screening and Pap tests.  Colorectal Cancer  This type of cancer can be detected and often prevented.  Routine colorectal cancer screening usually begins at 32 years of age and continues through 32 years of age.  Your health care provider may recommend screening at an earlier age if you have risk factors for colon cancer.  Your health care provider may also  recommend using home test kits to check for hidden blood in the stool.  A small camera at the end of a tube can be used to examine your colon directly (sigmoidoscopy or colonoscopy). This is done to check for the earliest forms of colorectal cancer.  Routine screening usually begins at age 43.  Direct examination of the colon should be repeated every 5-10 years through 32 years of age. However, you may need to be screened more often if early forms of precancerous polyps or small growths are found.  Skin Cancer  Check your skin from head to toe regularly.  Tell your health care provider about any new moles or changes in moles, especially if there is a change in a mole's shape or color.  Also tell your health care provider if you have a mole that is larger than the size of a pencil eraser.  Always use sunscreen. Apply sunscreen liberally and repeatedly throughout the day.  Protect yourself by wearing long sleeves, pants, a wide-brimmed hat, and sunglasses whenever you are outside.  Heart disease, diabetes, and high blood pressure  High blood pressure causes heart disease and increases the risk of stroke. High blood pressure is more likely to develop in: ? People who have blood pressure in the high end of the normal range (130-139/85-89 mm Hg). ? People who are overweight or obese. ? People who are African American.  If you are 70-78 years of age, have your blood pressure checked every 3-5 years. If you are 24 years of age or older, have your blood pressure checked every year. You should have your blood pressure measured twice-once when you are at a hospital or clinic, and once when you are not at a hospital or clinic. Record the average of the two measurements. To check your blood pressure when you are not at a hospital or clinic, you can use: ? An automated blood pressure machine at a pharmacy. ? A home blood pressure monitor.  If you are between 77 years and 5 years old, ask your  health care provider if you should take aspirin to prevent strokes.  Have regular diabetes screenings. This involves taking a blood sample to check your fasting blood sugar level. ? If you are at a normal weight and have a low risk for diabetes, have this test once every three years after 32 years of age. ? If you are overweight and have a high risk for diabetes, consider being tested at a younger age or more often. Preventing infection Hepatitis B  If you have a higher risk for hepatitis B, you should be screened for this virus. You are considered at high risk for hepatitis B if: ? You were born in a country where hepatitis B is common. Ask your health care provider which countries are considered high risk. ? Your parents were born in a high-risk country, and you have not been immunized against hepatitis B (hepatitis B vaccine). ? You have HIV or AIDS. ? You use needles to inject street drugs. ? You live with someone who has hepatitis B. ? You have had sex with someone who has hepatitis B. ? You get hemodialysis treatment. ? You take certain medicines for conditions, including cancer, organ transplantation, and autoimmune conditions.  Hepatitis C  Blood testing is recommended for: ? Everyone born from [birth year] through 1965. ? Anyone with known risk factors for hepatitis C.  Sexually transmitted infections (STIs)  You should be screened for sexually transmitted infections (STIs) including gonorrhea and chlamydia if: ? You are sexually active and are younger than 32 years of age. ? You are older than 32 years of age and your health care provider tells you that you are at risk for this type of infection. ? Your sexual activity has changed since you were last screened and you are at an increased risk for chlamydia or gonorrhea. Ask your health care provider if you are at risk.  If you do not have HIV, but are at risk, it may be recommended that you take a prescription medicine daily to  prevent HIV infection. This is called pre-exposure prophylaxis (PrEP). You are considered at risk if: ? You are sexually active and do not regularly use condoms or know the HIV status of your partner(s). ? You take drugs by injection. ? You are sexually active with a partner who has HIV.  Talk with your health care provider about whether you are at high risk of being infected with HIV. If you choose to begin PrEP, you should first be tested for HIV.  You should then be tested every 3 months for as long as you are taking PrEP. Pregnancy  If you are premenopausal and you may become pregnant, ask your health care provider about preconception counseling.  If you may become pregnant, take 400 to 800 micrograms (mcg) of folic acid every day.  If you want to prevent pregnancy, talk to your health care provider about birth control (contraception). Osteoporosis and menopause  Osteoporosis is a disease in which the bones lose minerals and strength with aging. This can result in serious bone fractures. Your risk for osteoporosis can be identified using a bone density scan.  If you are 12 years of age or older, or if you are at risk for osteoporosis and fractures, ask your health care provider if you should be screened.  Ask your health care provider whether you should take a calcium or vitamin D supplement to lower your risk for osteoporosis.  Menopause may have certain physical symptoms and risks.  Hormone replacement therapy may reduce some of these symptoms and risks. Talk to your health care provider about whether hormone replacement therapy is right for you. Follow these instructions at home:  Schedule regular health, dental, and eye exams.  Stay current with your immunizations.  Do not use any tobacco products including cigarettes, chewing tobacco, or electronic cigarettes.  If you are pregnant, do not drink alcohol.  If you are breastfeeding, limit how much and how often you drink  alcohol.  Limit alcohol intake to no more than 1 drink per day for nonpregnant women. One drink equals 12 ounces of beer, 5 ounces of wine, or 1 ounces of hard liquor.  Do not use street drugs.  Do not share needles.  Ask your health care provider for help if you need support or information about quitting drugs.  Tell your health care provider if you often feel depressed.  Tell your health care provider if you have ever been abused or do not feel safe at home. This information is not intended to replace advice given to you by your health care provider. Make sure you discuss any questions you have with your health care provider. Document Released: 03/23/2011 Document Revised: 02/13/2016 Document Reviewed: 06/11/2015 Elsevier Interactive Patient Education  [employer].

## 2018-04-18 NOTE — Progress Notes (Signed)
Subjective:     Kristen Watkins is a 32 y.o. female who presents for a postpartum visit. She is 3wks 1day postpartum following a spontaneous vaginal delivery. I have fully reviewed the prenatal and intrapartum course. The delivery was at 6934w6d. Outcome: spontaneous vaginal delivery. Anesthesia: epidural. Postpartum course has been uncomplicated. Baby's course has been uncomplicated. Baby is feeding by breast. Bleeding staining only. Bowel function is normal. Bladder function is normal. Patient is not sexually active. Contraception method is Nexplanon. Postpartum depression screening: negative.  The following portions of the patient's history were reviewed and updated as appropriate: allergies, current medications, past family history, past medical history, past social history, past surgical history and problem list.  Review of Systems Pertinent items are noted in HPI.   Objective:    BP 114/61   Pulse (!) 57   Wt 191 lb 8 oz (86.9 kg)   Breastfeeding? Yes   BMI 30.91 kg/m   General:  alert, cooperative and appears stated age  Lungs: normal effort  Abdomen: soft, nontender        Assessment:     Normal postpartum exam. Pap smear up to date.   NEXPLANON INSERTION: Appropriate time out taken. Nexlanon site (left arm) identified and the area was prepped in usual sterile fashon. 2 cc of 1% lidocaine was used to anesthetize the area starting with the distal end.   Next, the area was cleansed with betadine and the Nexplanon was inserted without difficulty.  Pressure bandage was applied.  Pt was instructed to remove pressure bandage in a few hours, and keep insertion site covered with a bandaid for 3 days.    Plan:   1. Encounter for routine postpartum follow-up -doing well  2. Nexplanon insertion -Has used nexplanon in past & wanted to use again.    Judeth HornLawrence, Amarri Michaelson, NP

## 2018-05-27 ENCOUNTER — Encounter: Payer: Self-pay | Admitting: *Deleted

## 2019-08-31 DIAGNOSIS — H60502 Unspecified acute noninfective otitis externa, left ear: Secondary | ICD-10-CM | POA: Diagnosis not present

## 2019-08-31 DIAGNOSIS — H6692 Otitis media, unspecified, left ear: Secondary | ICD-10-CM | POA: Diagnosis not present

## 2019-11-20 ENCOUNTER — Ambulatory Visit: Payer: Medicaid Other | Attending: Family Medicine | Admitting: Family Medicine

## 2019-11-20 ENCOUNTER — Other Ambulatory Visit: Payer: Self-pay

## 2019-11-20 ENCOUNTER — Encounter: Payer: Self-pay | Admitting: Family Medicine

## 2019-11-20 VITALS — BP 93/62 | HR 63 | Ht 66.0 in | Wt 191.0 lb

## 2019-11-20 DIAGNOSIS — M25562 Pain in left knee: Secondary | ICD-10-CM | POA: Insufficient documentation

## 2019-11-20 DIAGNOSIS — Z13228 Encounter for screening for other metabolic disorders: Secondary | ICD-10-CM | POA: Insufficient documentation

## 2019-11-20 DIAGNOSIS — R5383 Other fatigue: Secondary | ICD-10-CM

## 2019-11-20 DIAGNOSIS — M222X1 Patellofemoral disorders, right knee: Secondary | ICD-10-CM

## 2019-11-20 DIAGNOSIS — M222X2 Patellofemoral disorders, left knee: Secondary | ICD-10-CM | POA: Diagnosis not present

## 2019-11-20 DIAGNOSIS — M25561 Pain in right knee: Secondary | ICD-10-CM | POA: Insufficient documentation

## 2019-11-20 DIAGNOSIS — L650 Telogen effluvium: Secondary | ICD-10-CM | POA: Diagnosis not present

## 2019-11-20 NOTE — Progress Notes (Signed)
Would like to get Vit D levels checked. Has been having knee pain. Patient is fasting.

## 2019-11-20 NOTE — Progress Notes (Signed)
Subjective:  Patient ID: Kristen Watkins, female    DOB: [birth year]  Age: 34 y.o. MRN: [id number]  CC: Knee Pain   HPI Kristen Watkins presents to establish care. Last seen in the clinic in 2018. She complains of bilateral knee pain for 6-7 months which feels like sharp , poking pian behind her patella bilaterally. Going up the stairs, prolonged walking, walking fast, heavy lifting aggravates her symptoms.  She does not use any analgesics.  Pain is described as 6/10. Her nails break off easily and her hair falls out and this has been since she had her baby in 03/2018. She also complains of fatigue which is unrelated to activity.  Past Medical History:  Diagnosis Date  . UTI (lower urinary tract infection) 03/01/2014   Serratia and Enterococcus     Past Surgical History:  Procedure Laterality Date  . NO PAST SURGERIES      Family History  Problem Relation Age of Onset  . Alcohol abuse Neg Hx   . Arthritis Neg Hx   . Asthma Neg Hx   . Birth defects Neg Hx   . Cancer Neg Hx   . COPD Neg Hx   . Depression Neg Hx   . Diabetes Neg Hx   . Drug abuse Neg Hx   . Early death Neg Hx   . Heart disease Neg Hx   . Hearing loss Neg Hx   . Hyperlipidemia Neg Hx   . Hypertension Neg Hx   . Kidney disease Neg Hx   . Learning disabilities Neg Hx   . Mental illness Neg Hx   . Mental retardation Neg Hx   . Miscarriages / Stillbirths Neg Hx   . Stroke Neg Hx   . Vision loss Neg Hx   . Varicose Veins Neg Hx     No Known Allergies  Outpatient Medications Prior to Visit  Medication Sig Dispense Refill  . Cholecalciferol (VITAMIN D3) 2000 units TABS Take 1 tablet by mouth daily.    Marland Kitchen ibuprofen (ADVIL,MOTRIN) 600 MG tablet Take 1 tablet (600 mg total) by mouth every 6 (six) hours. (Patient not taking: Reported on 04/18/2018) 30 tablet 0  . Prenatal Vit-Fe Fumarate-FA (PRENATAL VITAMIN PLUS LOW IRON) 27-1 MG TABS Take 1 tablet by mouth daily. (Patient not taking: Reported on 11/20/2019) 30 tablet 11    No facility-administered medications prior to visit.     ROS Review of Systems  Constitutional: Negative for activity change, appetite change and fatigue.  HENT: Negative for congestion, sinus pressure and sore throat.   Eyes: Negative for visual disturbance.  Respiratory: Negative for cough, chest tightness, shortness of breath and wheezing.   Cardiovascular: Negative for chest pain and palpitations.  Gastrointestinal: Negative for abdominal distention, abdominal pain and constipation.  Endocrine: Negative for polydipsia.  Genitourinary: Negative for dysuria and frequency.  Musculoskeletal:       See HPI  Skin: Negative for rash.  Neurological: Negative for tremors, light-headedness and numbness.  Hematological: Does not bruise/bleed easily.  Psychiatric/Behavioral: Negative for agitation and behavioral problems.    Objective:  BP 93/62   Pulse 63   Ht 5' 6" (1.676 m)   Wt 191 lb (86.6 kg)   SpO2 100%   BMI 30.83 kg/m   BP/Weight 11/20/2019 5/99/7741 12/22/3951  Systolic BP 93 202 334  Diastolic BP 62 61 60  Wt. (Lbs) 191 191.5 -  BMI 30.83 30.91 -      Physical Exam Constitutional:  Appearance: She is well-developed.  Neck:     Vascular: No JVD.  Cardiovascular:     Rate and Rhythm: Normal rate.     Heart sounds: Normal heart sounds. No murmur.  Pulmonary:     Effort: Pulmonary effort is normal.     Breath sounds: Normal breath sounds. No wheezing or rales.  Chest:     Chest wall: No tenderness.  Abdominal:     General: Bowel sounds are normal. There is no distension.     Palpations: Abdomen is soft. There is no mass.     Tenderness: There is no abdominal tenderness.  Musculoskeletal:        General: Normal range of motion.     Right lower leg: No edema.     Left lower leg: No edema.  Neurological:     Mental Status: She is alert and oriented to person, place, and time.  Psychiatric:        Mood and Affect: Mood normal.     CMP Latest Ref Rng &  Units 11/04/2016  Glucose 65 - 99 mg/dL 74  BUN 7 - 25 mg/dL 7  Creatinine 0.50 - 1.10 mg/dL 0.57  Sodium 135 - 146 mmol/L 140  Potassium 3.5 - 5.3 mmol/L 4.2  Chloride 98 - 110 mmol/L 105  CO2 20 - 31 mmol/L 23  Calcium 8.6 - 10.2 mg/dL 9.6  Total Protein 6.1 - 8.1 g/dL 7.3  Total Bilirubin 0.2 - 1.2 mg/dL 0.5  Alkaline Phos 33 - 115 U/L 58  AST 10 - 30 U/L 17  ALT 6 - 29 U/L 14    Lipid Panel     Component Value Date/Time   CHOL 165 11/04/2016 1044   TRIG 73 11/04/2016 1044   HDL 49 (L) 11/04/2016 1044   CHOLHDL 3.4 11/04/2016 1044   VLDL 15 11/04/2016 1044   LDLCALC 101 (H) 11/04/2016 1044    CBC    Component Value Date/Time   WBC 10.7 (H) 03/27/2018 0430   RBC 4.34 03/27/2018 0430   HGB 12.4 03/27/2018 0430   HGB 11.4 01/25/2018 0928   HCT 37.9 03/27/2018 0430   HCT 33.9 (L) 01/25/2018 0928   PLT 330 03/27/2018 0430   PLT 322 01/25/2018 0928   MCV 87.3 03/27/2018 0430   MCV 83 01/25/2018 0928   MCH 28.6 03/27/2018 0430   MCHC 32.7 03/27/2018 0430   RDW 15.1 03/27/2018 0430   RDW 15.6 (H) 01/25/2018 0928   LYMPHSABS 3.1 01/25/2018 0928   MONOABS 360 11/04/2016 1044   EOSABS 0.3 01/25/2018 0928   BASOSABS 0.1 01/25/2018 0928    Lab Results  Component Value Date   HGBA1C 5.0 11/04/2016    Assessment & Plan:  1. Screening for metabolic disorder - FIE33+IRJJ - Lipid panel  2. Patellofemoral syndrome of both knees Advised that weight loss will be beneficial She is currently breast-feeding and has been advised to obtain OTC Voltaren gel Trial of physical therapy, ice, knee brace If persisting will refer to Ortho. - Ambulatory referral to Physical Therapy  3. Other fatigue We will evaluate for metabolic cause - VITAMIN D 25 Hydroxy (Vit-D Deficiency, Fractures) - CBC with Differential/Platelet - TSH - T4, free  4. Telogen effluvium Explained that this is not uncommon after childbirth Patient reassured  Return in about 3 months (around 02/20/2020)  for Chronic medical conditions.      Charlott Rakes, MD, FAAFP. [facility] [place], [place]   11/20/2019,  9:08 AM

## 2019-11-20 NOTE — Patient Instructions (Addendum)
Patellofemoral Pain Syndrome  Patellofemoral pain syndrome is a condition in which the tissue (cartilage) on the underside of the kneecap (patella) softens or breaks down. This causes pain in the front of the knee. The condition is also called runner's knee or chondromalacia patella. Patellofemoral pain syndrome is most common in young adults who are active in sports. The knee is the largest joint in the body. The patella covers the front of the knee and is attached to muscles above and below the knee. The underside of the patella is covered with a smooth type of cartilage (synovium). The smooth surface helps the patella to glide easily when you move your knee. Patellofemoral pain syndrome causes swelling in the joint linings and bone surfaces in the knee. What are the causes? This condition may be caused by:  Overuse of the knee.  Poor alignment of your knee joints.  Weak leg muscles.  A direct blow to your kneecap. What increases the risk? You are more likely to develop this condition if:  You do a lot of activities that can wear down your kneecap. These include: ? Running. ? Squatting. ? Climbing stairs.  You start a new physical activity or exercise program.  You wear shoes that do not fit well.  You do not have good leg strength.  You are overweight. What are the signs or symptoms? The main symptom of this condition is knee pain. This may feel like a dull, aching pain underneath your patella, in the front of your knee. There may be a popping or cracking sound when you move your knee. Pain may get worse with:  Exercise.  Climbing stairs.  Running.  Jumping.  Squatting.  Kneeling.  Sitting for a long time.  Moving or pushing on your patella. How is this diagnosed? This condition may be diagnosed based on:  Your symptoms and medical history. You may be asked about your recent physical activities and which ones cause knee pain.  A physical exam. This may  include: ? Moving your patella back and forth. ? Checking your range of knee motion. ? Having you squat or jump to see if you have pain. ? Checking the strength of your leg muscles.  Imaging tests to confirm the diagnosis. These may include an MRI of your knee. How is this treated? This condition may be treated at home with rest, ice, compression, and elevation (RICE).  Other treatments may include:  Nonsteroidal anti-inflammatory drugs (NSAIDs).  Physical therapy to stretch and strengthen your leg muscles.  Shoe inserts (orthotics) to take stress off your knee.  A knee brace or knee support.  Adhesive tapes to the skin.  Surgery to remove damaged cartilage or move the patella to a better position. This is rare. Follow these instructions at home: If you have a shoe or brace:  Wear the shoe or brace as told by your health care provider. Remove it only as told by your health care provider.  Loosen the shoe or brace if your toes tingle, become numb, or turn cold and blue.  Keep the shoe or brace clean.  If the shoe or brace is not waterproof: ? Do not let it get wet. ? Cover it with a watertight covering when you take a bath or a shower. Managing pain, stiffness, and swelling  If directed, put ice on the painful area. ? If you have a removable shoe or brace, remove it as told by your health care provider. ? Put ice in a plastic bag. ?   Place a towel between your skin and the bag. ? Leave the ice on for 20 minutes, 2-3 times a day.  Move your toes often to avoid stiffness and to lessen swelling.  Rest your knee: ? Avoid activities that cause knee pain. ? When sitting or lying down, raise (elevate) the injured area above the level of your heart, whenever possible. General instructions  Take over-the-counter and prescription medicines only as told by your health care provider.  Use splints, braces, knee supports, or walking aids as directed by your health care  provider.  Perform stretching and strengthening exercises as told by your health care provider or physical therapist.  Do not use any products that contain nicotine or tobacco, such as cigarettes and e-cigarettes. These can delay healing. If you need help quitting, ask your health care provider.  Return to your normal activities as told by your health care provider. Ask your health care provider what activities are safe for you.  Keep all follow-up visits as told by your health care provider. This is important. Contact a health care provider if:  Your symptoms get worse.  You are not improving with home care. Summary  Patellofemoral pain syndrome is a condition in which the tissue (cartilage) on the underside of the kneecap (patella) softens or breaks down.  This condition causes swelling in the joint linings and bone surfaces in the knee. This leads to pain in the front of the knee.  This condition may be treated at home with rest, ice, compression, and elevation (RICE).  Use splints, braces, knee supports, or walking aids as directed by your health care provider. This information is not intended to replace advice given to you by your health care provider. Make sure you discuss any questions you have with your health care provider. Document Revised: 10/18/2017 Document Reviewed: 10/18/2017 Elsevier Patient Education  2020 [employer]vier [employer]  

## 2019-11-21 ENCOUNTER — Other Ambulatory Visit: Payer: Self-pay | Admitting: Family Medicine

## 2019-11-21 LAB — CBC WITH DIFFERENTIAL/PLATELET
Basophils Absolute: 0.1 10*3/uL (ref 0.0–0.2)
Basos: 2 %
EOS (ABSOLUTE): 0.2 10*3/uL (ref 0.0–0.4)
Eos: 4 %
Hematocrit: 35.5 % (ref 34.0–46.6)
Hemoglobin: 11.7 g/dL (ref 11.1–15.9)
Immature Grans (Abs): 0 10*3/uL (ref 0.0–0.1)
Immature Granulocytes: 0 %
Lymphocytes Absolute: 3 10*3/uL (ref 0.7–3.1)
Lymphs: 47 %
MCH: 27.2 pg (ref 26.6–33.0)
MCHC: 33 g/dL (ref 31.5–35.7)
MCV: 83 fL (ref 79–97)
Monocytes Absolute: 0.5 10*3/uL (ref 0.1–0.9)
Monocytes: 8 %
Neutrophils Absolute: 2.5 10*3/uL (ref 1.4–7.0)
Neutrophils: 39 %
Platelets: 444 10*3/uL (ref 150–450)
RBC: 4.3 x10E6/uL (ref 3.77–5.28)
RDW: 12.8 % (ref 11.7–15.4)
WBC: 6.3 10*3/uL (ref 3.4–10.8)

## 2019-11-21 LAB — LIPID PANEL
Chol/HDL Ratio: 2.8 ratio (ref 0.0–4.4)
Cholesterol, Total: 173 mg/dL (ref 100–199)
HDL: 62 mg/dL (ref >39)
LDL Chol Calc (NIH): 102 mg/dL — ABNORMAL HIGH (ref 0–99)
Triglycerides: 44 mg/dL (ref 0–149)
VLDL Cholesterol Cal: 9 mg/dL (ref 5–40)

## 2019-11-21 LAB — CMP14+EGFR
ALT: 10 IU/L (ref 0–32)
AST: 15 IU/L (ref 0–40)
Albumin/Globulin Ratio: 2 (ref 1.2–2.2)
Albumin: 4.7 g/dL (ref 3.8–4.8)
Alkaline Phosphatase: 78 IU/L (ref 39–117)
BUN/Creatinine Ratio: 19 (ref 9–23)
BUN: 10 mg/dL (ref 6–20)
Bilirubin Total: 0.2 mg/dL (ref 0.0–1.2)
CO2: 20 mmol/L (ref 20–29)
Calcium: 9.3 mg/dL (ref 8.7–10.2)
Chloride: 105 mmol/L (ref 96–106)
Creatinine, Ser: 0.54 mg/dL — ABNORMAL LOW (ref 0.57–1.00)
GFR calc Af Amer: 143 mL/min/{1.73_m2} (ref >59)
GFR calc non Af Amer: 124 mL/min/{1.73_m2} (ref >59)
Globulin, Total: 2.4 g/dL (ref 1.5–4.5)
Glucose: 85 mg/dL (ref 65–99)
Potassium: 4.3 mmol/L (ref 3.5–5.2)
Sodium: 139 mmol/L (ref 134–144)
Total Protein: 7.1 g/dL (ref 6.0–8.5)

## 2019-11-21 LAB — TSH: TSH: 1.92 u[IU]/mL (ref 0.450–4.500)

## 2019-11-21 LAB — VITAMIN D 25 HYDROXY (VIT D DEFICIENCY, FRACTURES): Vit D, 25-Hydroxy: 14.8 ng/mL — ABNORMAL LOW (ref 30.0–100.0)

## 2019-11-21 LAB — T4, FREE: Free T4: 1.03 ng/dL (ref 0.82–1.77)

## 2019-11-21 MED ORDER — ERGOCALCIFEROL 1.25 MG (50000 UT) PO CAPS: 50000.0000 [IU] | ORAL_CAPSULE | ORAL | 0 refills | Status: DC

## 2019-11-24 ENCOUNTER — Telehealth: Payer: Self-pay

## 2019-11-24 NOTE — Telephone Encounter (Signed)
-----   Message from Hoy Register, MD sent at 11/21/2019  8:43 AM EST ----- Please inform her vitamin D level is low, I have sent a prescription for Drisdol to her pharmacy which she will take for 12 weeks and after we will need to repeat levels.  Once she is done with that course, OTC vitamin D 1000 international units daily can be taken.

## 2019-11-24 NOTE — Telephone Encounter (Signed)
Patient was called and a voicemail was left informing patient to return phone call for lab results. 

## 2019-12-01 ENCOUNTER — Telehealth: Payer: Self-pay

## 2019-12-01 NOTE — Telephone Encounter (Signed)
-----   Message from Enobong Newlin, MD sent at 11/21/2019  8:43 AM EST ----- Please inform her vitamin D level is low, I have sent a prescription for Drisdol to her pharmacy which she will take for 12 weeks and after we will need to repeat levels.  Once she is done with that course, OTC vitamin D 1000 international units daily can be taken. 

## 2019-12-01 NOTE — Telephone Encounter (Signed)
Patient was called and a voicemail was left informing patient to return phone call for lab results. 

## 2019-12-04 NOTE — Telephone Encounter (Signed)
Please call patient back with lab results

## 2019-12-04 NOTE — Telephone Encounter (Signed)
Patient was called and given lab results. 

## 2019-12-04 NOTE — Telephone Encounter (Signed)
Patient did request to have a call back before 12pm.  Thank you

## 2020-02-09 ENCOUNTER — Ambulatory Visit: Payer: Medicaid Other | Attending: Family Medicine | Admitting: Family Medicine

## 2020-02-09 ENCOUNTER — Encounter: Payer: Self-pay | Admitting: Family Medicine

## 2020-02-09 ENCOUNTER — Other Ambulatory Visit: Payer: Self-pay

## 2020-02-09 VITALS — BP 98/63 | HR 64 | Ht 66.0 in | Wt 195.8 lb

## 2020-02-09 DIAGNOSIS — M222X1 Patellofemoral disorders, right knee: Secondary | ICD-10-CM

## 2020-02-09 DIAGNOSIS — Z79899 Other long term (current) drug therapy: Secondary | ICD-10-CM | POA: Insufficient documentation

## 2020-02-09 DIAGNOSIS — R5383 Other fatigue: Secondary | ICD-10-CM | POA: Insufficient documentation

## 2020-02-09 DIAGNOSIS — M222X2 Patellofemoral disorders, left knee: Secondary | ICD-10-CM

## 2020-02-09 DIAGNOSIS — M25561 Pain in right knee: Secondary | ICD-10-CM | POA: Diagnosis not present

## 2020-02-09 DIAGNOSIS — E559 Vitamin D deficiency, unspecified: Secondary | ICD-10-CM | POA: Insufficient documentation

## 2020-02-09 DIAGNOSIS — Z791 Long term (current) use of non-steroidal anti-inflammatories (NSAID): Secondary | ICD-10-CM | POA: Diagnosis not present

## 2020-02-09 DIAGNOSIS — M25562 Pain in left knee: Secondary | ICD-10-CM | POA: Diagnosis not present

## 2020-02-09 MED ORDER — IBUPROFEN 600 MG PO TABS: 600.0000 mg | ORAL_TABLET | Freq: Three times a day (TID) | ORAL | 2 refills | Status: DC | PRN

## 2020-02-09 NOTE — Progress Notes (Signed)
Subjective:  Patient ID: Kristen Watkins, female    DOB: 1986-02-20  Age: 34 y.o. MRN: [id number]  CC: Knee Pain   HPI [name] presents for follow-up of bilateral knee pain for which she was seen at her last office visit.  She had also complained of fatigue and labs revealed vitamin D deficiency but she was negative for anemia or thyroid disease.  She states her fatigue still persists after completing course of Drisdol. She still has bilateral knee pain; R>L and feels like she has a sharp pain in her knee and wonders if she has an infection.  Pain is moderate to severe and worse with bending or climbing up stairs. States she does not have time for PT Past Medical History:  Diagnosis Date  . UTI (lower urinary tract infection) 03/01/2014   Serratia and Enterococcus     Past Surgical History:  Procedure Laterality Date  . NO PAST SURGERIES      Family History  Problem Relation Age of Onset  . Alcohol abuse Neg Hx   . Arthritis Neg Hx   . Asthma Neg Hx   . Birth defects Neg Hx   . Cancer Neg Hx   . COPD Neg Hx   . Depression Neg Hx   . Diabetes Neg Hx   . Drug abuse Neg Hx   . Early death Neg Hx   . Heart disease Neg Hx   . Hearing loss Neg Hx   . Hyperlipidemia Neg Hx   . Hypertension Neg Hx   . Kidney disease Neg Hx   . Learning disabilities Neg Hx   . Mental illness Neg Hx   . Mental retardation Neg Hx   . Miscarriages / Stillbirths Neg Hx   . Stroke Neg Hx   . Vision loss Neg Hx   . Varicose Veins Neg Hx     No Known Allergies  Outpatient Medications Prior to Visit  Medication Sig Dispense Refill  . Cholecalciferol (VITAMIN D3) 2000 units TABS Take 1 tablet by mouth daily.    . ergocalciferol (DRISDOL) 1.25 MG (50000 UT) capsule Take 1 capsule (50,000 Units total) by mouth once a week. (Patient not taking: Reported on 02/09/2020) 12 capsule 0  . ibuprofen (ADVIL,MOTRIN) 600 MG tablet Take 1 tablet (600 mg total) by mouth every 6 (six) hours. (Patient not  taking: Reported on 04/18/2018) 30 tablet 0  . Prenatal Vit-Fe Fumarate-FA (PRENATAL VITAMIN PLUS LOW IRON) 27-1 MG TABS Take 1 tablet by mouth daily. (Patient not taking: Reported on 11/20/2019) 30 tablet 11   No facility-administered medications prior to visit.     ROS Review of Systems  Constitutional: Negative for activity change, appetite change and fatigue.  HENT: Negative for congestion, sinus pressure and sore throat.   Eyes: Negative for visual disturbance.  Respiratory: Negative for cough, chest tightness, shortness of breath and wheezing.   Cardiovascular: Negative for chest pain and palpitations.  Gastrointestinal: Negative for abdominal distention, abdominal pain and constipation.  Endocrine: Negative for polydipsia.  Genitourinary: Negative for dysuria and frequency.  Musculoskeletal:       See HPI  Skin: Negative for rash.  Neurological: Negative for tremors, light-headedness and numbness.  Hematological: Does not bruise/bleed easily.  Psychiatric/Behavioral: Negative for agitation and behavioral problems.    Objective:  BP 98/63   Pulse 64   Ht 5\' 6"  (1.676 m)   Wt 195 lb 12.8 oz (88.8 kg)   SpO2 100%   BMI 31.60 kg/m  BP/Weight 02/09/2020 11/20/2019 02/05/6159  Systolic BP 98 93 737  Diastolic BP 63 62 61  Wt. (Lbs) 195.8 191 191.5  BMI 31.6 30.83 30.91      Physical Exam Constitutional:      Appearance: She is well-developed.  Neck:     Vascular: No JVD.  Cardiovascular:     Rate and Rhythm: Normal rate.     Heart sounds: Normal heart sounds. No murmur.  Pulmonary:     Effort: Pulmonary effort is normal.     Breath sounds: Normal breath sounds. No wheezing or rales.  Chest:     Chest wall: No tenderness.  Abdominal:     General: Bowel sounds are normal. There is no distension.     Palpations: Abdomen is soft. There is no mass.     Tenderness: There is no abdominal tenderness.  Musculoskeletal:        General: Normal range of motion.     Right  lower leg: No edema.     Left lower leg: No edema.  Neurological:     Mental Status: She is alert and oriented to person, place, and time.  Psychiatric:        Mood and Affect: Mood normal.     CMP Latest Ref Rng & Units 11/20/2019 11/04/2016  Glucose 65 - 99 mg/dL 85 74  BUN 6 - 20 mg/dL 10 7  Creatinine 0.57 - 1.00 mg/dL 0.54(L) 0.57  Sodium 134 - 144 mmol/L 139 140  Potassium 3.5 - 5.2 mmol/L 4.3 4.2  Chloride 96 - 106 mmol/L 105 105  CO2 20 - 29 mmol/L 20 23  Calcium 8.7 - 10.2 mg/dL 9.3 9.6  Total Protein 6.0 - 8.5 g/dL 7.1 7.3  Total Bilirubin 0.0 - 1.2 mg/dL 0.2 0.5  Alkaline Phos 39 - 117 IU/L 78 58  AST 0 - 40 IU/L 15 17  ALT 0 - 32 IU/L 10 14    Lipid Panel     Component Value Date/Time   CHOL 173 11/20/2019 0938   TRIG 44 11/20/2019 0938   HDL 62 11/20/2019 0938   CHOLHDL 2.8 11/20/2019 0938   CHOLHDL 3.4 11/04/2016 1044   VLDL 15 11/04/2016 1044   LDLCALC 102 (H) 11/20/2019 0938    CBC    Component Value Date/Time   WBC 6.3 11/20/2019 0938   WBC 10.7 (H) 03/27/2018 0430   RBC 4.30 11/20/2019 0938   RBC 4.34 03/27/2018 0430   HGB 11.7 11/20/2019 0938   HCT 35.5 11/20/2019 0938   PLT 444 11/20/2019 0938   MCV 83 11/20/2019 0938   MCH 27.2 11/20/2019 0938   MCH 28.6 03/27/2018 0430   MCHC 33.0 11/20/2019 0938   MCHC 32.7 03/27/2018 0430   RDW 12.8 11/20/2019 0938   LYMPHSABS 3.0 11/20/2019 0938   MONOABS 360 11/04/2016 1044   EOSABS 0.2 11/20/2019 0938   BASOSABS 0.1 11/20/2019 0938    Lab Results  Component Value Date   HGBA1C 5.0 11/04/2016    Assessment & Plan:   1. Patellofemoral syndrome of both knees Uncontrolled Refilled ibuprofen Would love to place referral for PT however she states she has no time Weight loss with beneficial We will order imaging and follow-up as her next office visit - DG Knee Complete 4 Views Right; Future - DG Knee Complete 4 Views Left; Future   Return in about 6 weeks (around 03/22/2020) for follow up of  knee Pain.   Charlott Rakes, MD, FAAFP. [facility] [place], [place] [phone]   02/09/2020, 11:05 AM

## 2020-02-09 NOTE — Patient Instructions (Signed)

## 2020-02-21 ENCOUNTER — Ambulatory Visit: Payer: Medicaid Other | Admitting: Family Medicine

## 2020-09-20 DIAGNOSIS — Z20822 Contact with and (suspected) exposure to covid-19: Secondary | ICD-10-CM | POA: Diagnosis not present

## 2021-02-19 ENCOUNTER — Encounter: Payer: Self-pay | Admitting: Physician Assistant

## 2021-02-19 ENCOUNTER — Other Ambulatory Visit: Payer: Self-pay

## 2021-02-19 ENCOUNTER — Ambulatory Visit: Payer: Medicaid Other | Attending: Physician Assistant | Admitting: Physician Assistant

## 2021-02-19 VITALS — BP 100/65 | HR 72 | Resp 16 | Ht 64.0 in | Wt 202.2 lb

## 2021-02-19 DIAGNOSIS — H6983 Other specified disorders of Eustachian tube, bilateral: Secondary | ICD-10-CM

## 2021-02-19 DIAGNOSIS — R635 Abnormal weight gain: Secondary | ICD-10-CM | POA: Diagnosis not present

## 2021-02-19 DIAGNOSIS — M222X2 Patellofemoral disorders, left knee: Secondary | ICD-10-CM | POA: Diagnosis not present

## 2021-02-19 DIAGNOSIS — E559 Vitamin D deficiency, unspecified: Secondary | ICD-10-CM | POA: Diagnosis not present

## 2021-02-19 DIAGNOSIS — M222X1 Patellofemoral disorders, right knee: Secondary | ICD-10-CM

## 2021-02-19 DIAGNOSIS — Z13220 Encounter for screening for lipoid disorders: Secondary | ICD-10-CM | POA: Diagnosis not present

## 2021-02-19 DIAGNOSIS — Q828 Other specified congenital malformations of skin: Secondary | ICD-10-CM | POA: Diagnosis not present

## 2021-02-19 DIAGNOSIS — Z131 Encounter for screening for diabetes mellitus: Secondary | ICD-10-CM | POA: Diagnosis not present

## 2021-02-19 DIAGNOSIS — H11423 Conjunctival edema, bilateral: Secondary | ICD-10-CM

## 2021-02-19 MED ORDER — FLUTICASONE PROPIONATE 50 MCG/ACT NA SUSP: 2.0000 | Freq: Every day | NASAL | 6 refills | Status: DC

## 2021-02-19 MED ORDER — OLOPATADINE HCL 0.1 % OP SOLN: 1.0000 [drp] | Freq: Two times a day (BID) | OPHTHALMIC | 12 refills | Status: DC

## 2021-02-19 NOTE — Progress Notes (Signed)
Kristen Watkins, is a 35 y.o. female  SWN:[id number]  [id number]:[id number]  DOB - [birth year]  Subjective:  Chief Complaint and HPI: Kristen Watkins is a 35 y.o. female here today for multiple issues.  1. Developing skin tags over the last year or 2.  Wants to be checked for diabetes/insulin resistance. Also wants to see derm to have them removed. Cut down on sugar for more than 1 year and has not lost weight.  Still eating carbs. 2. B ear congestion and decreased hearing for about 1 month.  3. Eye redness-OTC drops did not help.  occasional itching.  Eyes feel dry.  No vision changes.  Wants to see eye doctor.  This has been occurring about 6 months 4.  Still having trouble with B knees and wants to see ortho.  longstanding  ROS:   Constitutional:  No f/c, No night sweats, No unexplained weight loss. EENT:   No mouth, throat, or other ear problems.  Respiratory: No cough, No SOB Cardiac: No CP, no palpitations GI:  No abd pain, No N/V/D. GU: No Urinary s/sx Musculoskeletal: B ear pain Neuro: No headache, no dizziness, no motor weakness.  Skin: No rash Endocrine:  No polydipsia. No polyuria.  Psych: Denies SI/HI  No problems updated.  ALLERGIES: No Known Allergies  PAST MEDICAL HISTORY: Past Medical History:  Diagnosis Date  . UTI (lower urinary tract infection) 03/01/2014   Serratia and Enterococcus     MEDICATIONS AT HOME: Prior to Admission medications   Medication Sig Start Date End Date Taking? Authorizing Provider  fluticasone (FLONASE) 50 MCG/ACT nasal spray Place 2 sprays into both nostrils daily. 02/19/21  Yes Georgian Co M, PA-C  olopatadine (PATADAY) 0.1 % ophthalmic solution Place 1 drop into both eyes 2 (two) times daily. 02/19/21  Yes Anders Simmonds, PA-C  Cholecalciferol (VITAMIN D3) 2000 units TABS Take 1 tablet by mouth daily. Patient not taking: Reported on 02/19/2021    [provider]  ergocalciferol (DRISDOL) 1.25 MG (50000 UT) capsule Take 1 capsule  (50,000 Units total) by mouth once a week. Patient not taking: No sig reported 11/21/19   Hoy Register, MD  ibuprofen (ADVIL) 600 MG tablet Take 1 tablet (600 mg total) by mouth every 8 (eight) hours as needed. Patient not taking: Reported on 02/19/2021 02/09/20   Hoy Register, MD  Prenatal Vit-Fe Fumarate-FA (PRENATAL VITAMIN PLUS LOW IRON) 27-1 MG TABS Take 1 tablet by mouth daily. Patient not taking: No sig reported 01/25/18   Currie Paris, NP     Objective:  EXAM:   Vitals:   02/19/21 1521  BP: 100/65  Pulse: 72  Resp: 16  SpO2: 98%  Weight: 202 lb 3.2 oz (91.7 kg)  Height: 5\' 4"  (1.626 m)    General appearance : A&OX3. NAD. Non-toxic-appearing HEENT: Atraumatic and Normocephalic.  PERRLA. EOM intact. B mild chemosis in inner canthus/lower palpebral conjunctiva with minimal erythema.  TM full but no erythema/clear B. Neck: supple, no JVD. No cervical lymphadenopathy. No thyromegaly Chest/Lungs:  Breathing-non-labored, Good air entry bilaterally, breath sounds normal without rales, rhonchi, or wheezing  CVS: S1 S2 regular, no murmurs, gallops, rubs  Extremities: Bilateral Lower Ext shows no edema, both legs are warm to touch with = pulse throughout B knees-stable ligaments/no effusions/erythema Neurology:  CN II-XII grossly intact, Non focal.   Psych:  TP linear. J/I WNL. Normal speech. Appropriate eye contact and affect.  Skin:  Several hyperpigmented skin tags on neck  Data Review Lab Results  Component Value Date   HGBA1C 5.0 11/04/2016     Assessment & Plan   1. Accessory skin tags - Ambulatory referral to Dermatology  2. Vitamin D deficiency - Vitamin D, 25-hydroxy  3. Chemosis of conjunctiva of both eyes - Ambulatory referral to Ophthalmology - olopatadine (PATADAY) 0.1 % ophthalmic solution; Place 1 drop into both eyes 2 (two) times daily.  Dispense: 5 mL; Refill: 12  4. Patellofemoral syndrome of both knees - Ambulatory referral to Orthopedic  Surgery  5. Eustachian tube dysfunction, bilateral - fluticasone (FLONASE) 50 MCG/ACT nasal spray; Place 2 sprays into both nostrils daily.  Dispense: 16 g; Refill: 6  6. Screening for diabetes mellitus I have had a lengthy discussion and provided education about insulin resistance and the intake of too much sugar/refined carbohydrates.  I have advised the patient to work at a goal of eliminating sugary drinks, candy, desserts, sweets, refined sugars, processed foods, and white carbohydrates.  The patient expresses understanding.  - Hemoglobin A1c  7. Screening cholesterol level - Lipid panel  8. Weight gain - Thyroid Panel With TSH     Patient have been counseled extensively about nutrition and exercise  Return in about 6 months (around 08/21/2021) for with PCP.  The patient was given clear instructions to go to ER or return to medical center if symptoms don't improve, worsen or new problems develop. The patient verbalized understanding. The patient was told to call to get lab results if they haven't heard anything in the next week.     Georgian Co, PA-C [facility] [place], [place] [phone]   02/19/2021, 3:44 PMPatient ID: [name], female   DOB: 05/20/1986, 35 y.o.   MRN: [id number]

## 2021-02-19 NOTE — Patient Instructions (Signed)
Skin Tag, Adult  A skin tag (acrochordon) is a soft, extra growth of skin. Most skin tags are skin-colored and rarely bigger than a pencil eraser. They commonly form in areas where there is frequent rubbing, or friction, on the skin. This may be where there are folds in the skin, such as the eyelids, neck, armpit, or groin. Skin tags are not dangerous, and they do not spread from person to person (are not contagious). You may have one skin tag or several. Skin tags do not require treatment. However, your health care provider may recommend removal of a skin tag if it:  Gets irritated from clothing or jewelry.  Bleeds.  Is visible and unsightly. What are the causes? This condition is linked with:  Increasing age.  Pregnancy.  Diabetes.  Obesity. What are the signs or symptoms? Skin tags usually do not cause symptoms unless they get irritated by items touching your skin, such as clothing or jewelry. When this happens, you may have pain, itching, or bleeding. How is this diagnosed? This condition is diagnosed with an evaluation from your health care provider. No testing is needed for diagnosis. How is this treated? Treatment for this condition depends on whether you have symptoms. If a skin tag needs to be removed, your health care provider can remove it with:  A simple surgical procedure using scissors.  A procedure that involves freezing your skin tag with a gas in liquid form (liquid nitrogen).  A procedure that uses heat to destroy your skin tag (electrodessication). Your health care provider may also remove your skin tag if it is visible or unsightly, Follow these instructions at home:  Watch for any changes in your skin tag. A normal skin tag does not require any other special care at home.  Take over-the-counter and prescription medicines only as told by your health care provider.  Keep all follow-up visits as told by your health care provider. This is important. Contact a  health care provider if:  You have a skin tag that: ? Becomes painful. ? Changes color. ? Bleeds. ? Swells. Summary  Skin tags are soft, extra growths of skin found in areas of frequent rubbing or friction.  Skin tags usually do not cause symptoms. If symptoms occur, you may have pain, itching, or bleeding.  If your skin tag causes symptoms or is unsightly, your health care provider can remove it. This information is not intended to replace advice given to you by your health care provider. Make sure you discuss any questions you have with your health care provider. Document Revised: 07/10/2019 Document Reviewed: 07/10/2019 Elsevier Patient Education  2021 [employer]. Patellofemoral Pain Syndrome  Patellofemoral pain syndrome is a condition in which the tissue (cartilage) on the underside of the kneecap (patella) softens or breaks down. This causes pain in the front of the knee. The condition is also called runner's knee or chondromalacia patella. Patellofemoral pain syndrome is most common in young adults who are active in sports. The knee is the largest joint in the body. The patella covers the front of the knee and is attached to muscles above and below the knee. The underside of the patella is covered with a smooth type of cartilage (synovium). The smooth surface helps the patella glide easily when you move your knee. Patellofemoral pain syndrome causes swelling in the joint linings and bone surfaces in the knee. What are the causes? This condition may be caused by:  Overuse of the knee.  Poor alignment of  your knee joints.  Weak leg muscles.  A direct hit to your kneecap. What increases the risk? You are more likely to develop this condition if:  You do a lot of activities that can wear down your kneecap. These include: ? Running. ? Squatting. ? Climbing stairs.  You start a new physical activity or exercise program.  You wear shoes that do not fit well.  You do not  have good leg strength.  You are overweight. What are the signs or symptoms? The main symptom of this condition is knee pain. This may feel like a dull, aching pain underneath your patella, in the front of your knee. There may be a popping or cracking sound when you move your knee. Pain may get worse with:  Exercise.  Climbing stairs.  Running.  Jumping.  Squatting.  Kneeling.  Sitting for a long time.  Moving or pushing on your patella. How is this diagnosed? This condition may be diagnosed based on:  Your symptoms and medical history. You may be asked about your recent physical activities and which ones cause knee pain.  A physical exam. This may include: ? Moving your patella back and forth. ? Checking your range of knee motion. ? Having you squat or jump to see if you have pain. ? Checking the strength of your leg muscles.  Imaging tests to confirm the diagnosis. These may include an MRI of your knee. How is this treated? This condition may be treated at home with rest, ice, compression, and elevation (RICE).  Other treatments may include:  NSAIDs, such as ibuprofen.  Physical therapy to stretch and strengthen your leg muscles.  Shoe inserts (orthotics) to take stress off your knee.  A knee brace or knee support.  Adhesive tapes to the skin.  Surgery to remove damaged cartilage or move the patella to a better position. This is rare. Follow these instructions at home: If you have a brace:  Wear the brace as told by your health care provider. Remove it only as told by your health care provider.  Loosen the brace if your toes tingle, become numb, or turn cold and blue.  Keep the brace clean.  If the brace is not waterproof: ? Do not let it get wet. ? Cover it with a watertight covering when you take a bath or a shower. Managing pain, stiffness, and swelling  If directed, put ice on the painful area. To do this: ? If you have a removable brace, remove it  as told by your health care provider. ? Put ice in a plastic bag. ? Place a towel between your skin and the bag. ? Leave the ice on for 20 minutes, 2-3 times a day. ? Remove the ice if your skin turns bright red. This is very important. If you cannot feel pain, heat, or cold, you have a greater risk of damage to the area.  Move your toes often to reduce stiffness and swelling.  Raise (elevate) the injured area above the level of your heart while you are sitting or lying down.   Activity  Rest your knee.  Avoid activities that cause knee pain.  Perform stretching and strengthening exercises as told by your health care provider or physical therapist.  Return to your normal activities as told by your health care provider. Ask your health care provider what activities are safe for you. General instructions  Take over-the-counter and prescription medicines only as told by your health care provider.  Use splints,  braces, knee supports, or walking aids as directed by your health care provider.  Do not use any products that contain nicotine or tobacco, such as cigarettes, e-cigarettes, and chewing tobacco. These can delay healing. If you need help quitting, ask your health care provider.  Keep all follow-up visits. This is important. Contact a health care provider if:  Your symptoms get worse.  You are not improving with home care. Summary  Patellofemoral pain syndrome is a condition in which the tissue (cartilage) on the underside of the kneecap (patella) softens or breaks down.  This condition causes swelling in the joint linings and bone surfaces in the knee. This leads to pain in the front of the knee.  This condition may be treated at home with rest, ice, compression, and elevation (RICE).  Use splints, braces, knee supports, or walking aids as directed by your health care provider. This information is not intended to replace advice given to you by your health care provider.  Make sure you discuss any questions you have with your health care provider. Document Revised: 02/21/2020 Document Reviewed: 02/21/2020 Elsevier Patient Education  2021 [employer] Inc. Eustachian Tube Dysfunction  Eustachian tube dysfunction refers to a condition in which a blockage develops in the narrow passage that connects the middle ear to the back of the nose (eustachian tube). The eustachian tube regulates air pressure in the middle ear by letting air move between the ear and nose. It also helps to drain fluid from the middle ear space. Eustachian tube dysfunction can affect one or both ears. When the eustachian tube does not function properly, air pressure, fluid, or both can build up in the middle ear. What are the causes? This condition occurs when the eustachian tube becomes blocked or cannot open normally. Common causes of this condition include:  Ear infections.  Colds and other infections that affect the nose, mouth, and throat (upper respiratory tract).  Allergies.  Irritation from cigarette smoke.  Irritation from stomach acid coming up into the esophagus (gastroesophageal reflux). The esophagus is the tube that carries food from the mouth to the stomach.  Sudden changes in air pressure, such as from descending in an airplane or scuba diving.  Abnormal growths in the nose or throat, such as: ? Growths that line the nose (nasal polyps). ? Abnormal growth of cells (tumors). ? Enlarged tissue at the back of the throat (adenoids). What increases the risk? You are more likely to develop this condition if:  You smoke.  You are overweight.  You are a child who has: ? Certain birth defects of the mouth, such as cleft palate. ? Large tonsils or adenoids. What are the signs or symptoms? Common symptoms of this condition include:  A feeling of fullness in the ear.  Ear pain.  Clicking or popping noises in the ear.  Ringing in the ear.  Hearing loss.  Loss of  balance.  Dizziness. Symptoms may get worse when the air pressure around you changes, such as when you travel to an area of high elevation, fly on an airplane, or go scuba diving. How is this diagnosed? This condition may be diagnosed based on:  Your symptoms.  A physical exam of your ears, nose, and throat.  Tests, such as those that measure: ? The movement of your eardrum (tympanogram). ? Your hearing (audiometry). How is this treated? Treatment depends on the cause and severity of your condition.  In mild cases, you may relieve your symptoms by moving air into your  ears. This is called "popping the ears."  In more severe cases, or if you have symptoms of fluid in your ears, treatment may include: ? Medicines to relieve congestion (decongestants). ? Medicines that treat allergies (antihistamines). ? Nasal sprays or ear drops that contain medicines that reduce swelling (steroids). ? A procedure to drain the fluid in your eardrum (myringotomy). In this procedure, a small tube is placed in the eardrum to:  Drain the fluid.  Restore the air in the middle ear space. ? A procedure to insert a balloon device through the nose to inflate the opening of the eustachian tube (balloon dilation). Follow these instructions at home: Lifestyle  Do not do any of the following until your health care provider approves: ? Travel to high altitudes. ? Fly in airplanes. ? Work in a Estate agent or room. ? Scuba dive.  Do not use any products that contain nicotine or tobacco, such as cigarettes and e-cigarettes. If you need help quitting, ask your health care provider.  Keep your ears dry. Wear fitted earplugs during showering and bathing. Dry your ears completely after. General instructions  Take over-the-counter and prescription medicines only as told by your health care provider.  Use techniques to help pop your ears as recommended by your health care provider. These may  include: ? Chewing gum. ? Yawning. ? Frequent, forceful swallowing. ? Closing your mouth, holding your nose closed, and gently blowing as if you are trying to blow air out of your nose.  Keep all follow-up visits as told by your health care provider. This is important. Contact a health care provider if:  Your symptoms do not go away after treatment.  Your symptoms come back after treatment.  You are unable to pop your ears.  You have: ? A fever. ? Pain in your ear. ? Pain in your head or neck. ? Fluid draining from your ear.  Your hearing suddenly changes.  You become very dizzy.  You lose your balance. Summary  Eustachian tube dysfunction refers to a condition in which a blockage develops in the eustachian tube.  It can be caused by ear infections, allergies, inhaled irritants, or abnormal growths in the nose or throat.  Symptoms include ear pain, hearing loss, or ringing in the ears.  Mild cases are treated with maneuvers to unblock the ears, such as yawning or ear popping.  Severe cases are treated with medicines. Surgery may also be done (rare). This information is not intended to replace advice given to you by your health care provider. Make sure you discuss any questions you have with your health care provider. Document Revised: 12/28/2017 Document Reviewed: 12/28/2017 Elsevier Patient Education  2021 [employer].

## 2021-02-19 NOTE — Progress Notes (Signed)
Pt states the pain comes and goes.

## 2021-02-20 ENCOUNTER — Other Ambulatory Visit: Payer: Self-pay | Admitting: Physician Assistant

## 2021-02-20 DIAGNOSIS — Z975 Presence of (intrauterine) contraceptive device: Secondary | ICD-10-CM

## 2021-02-20 LAB — THYROID PANEL WITH TSH
Free Thyroxine Index: 1.8 (ref 1.2–4.9)
T3 Uptake Ratio: 20 % — ABNORMAL LOW (ref 24–39)
T4, Total: 8.8 ug/dL (ref 4.5–12.0)
TSH: 1.23 u[IU]/mL (ref 0.450–4.500)

## 2021-02-20 LAB — LIPID PANEL
Chol/HDL Ratio: 3.2 ratio (ref 0.0–4.4)
Cholesterol, Total: 168 mg/dL (ref 100–199)
HDL: 52 mg/dL (ref >39)
LDL Chol Calc (NIH): 102 mg/dL — ABNORMAL HIGH (ref 0–99)
Triglycerides: 73 mg/dL (ref 0–149)
VLDL Cholesterol Cal: 14 mg/dL (ref 5–40)

## 2021-02-20 LAB — HEMOGLOBIN A1C
Est. average glucose Bld gHb Est-mCnc: 111 mg/dL
Hgb A1c MFr Bld: 5.5 % (ref 4.8–5.6)

## 2021-02-20 LAB — VITAMIN D 25 HYDROXY (VIT D DEFICIENCY, FRACTURES): Vit D, 25-Hydroxy: 24.5 ng/mL — ABNORMAL LOW (ref 30.0–100.0)

## 2021-02-20 MED ORDER — ERGOCALCIFEROL 1.25 MG (50000 UT) PO CAPS: 50000.0000 [IU] | ORAL_CAPSULE | ORAL | 0 refills | Status: DC

## 2021-02-25 DIAGNOSIS — H11153 Pinguecula, bilateral: Secondary | ICD-10-CM | POA: Diagnosis not present

## 2021-02-25 DIAGNOSIS — H1045 Other chronic allergic conjunctivitis: Secondary | ICD-10-CM | POA: Diagnosis not present

## 2021-02-25 DIAGNOSIS — H04123 Dry eye syndrome of bilateral lacrimal glands: Secondary | ICD-10-CM | POA: Diagnosis not present

## 2021-03-05 ENCOUNTER — Ambulatory Visit: Payer: Self-pay

## 2021-03-05 ENCOUNTER — Ambulatory Visit (INDEPENDENT_AMBULATORY_CARE_PROVIDER_SITE_OTHER): Payer: Medicaid Other | Admitting: Orthopedic Surgery

## 2021-03-05 VITALS — Ht 64.0 in | Wt 198.0 lb

## 2021-03-05 DIAGNOSIS — M170 Bilateral primary osteoarthritis of knee: Secondary | ICD-10-CM

## 2021-03-05 DIAGNOSIS — G8929 Other chronic pain: Secondary | ICD-10-CM

## 2021-03-05 DIAGNOSIS — M25562 Pain in left knee: Secondary | ICD-10-CM

## 2021-03-05 DIAGNOSIS — M25561 Pain in right knee: Secondary | ICD-10-CM

## 2021-03-05 MED ORDER — MELOXICAM 15 MG PO TABS: 15.0000 mg | ORAL_TABLET | Freq: Every day | ORAL | 0 refills | Status: DC

## 2021-03-06 ENCOUNTER — Encounter: Payer: Self-pay | Admitting: Orthopedic Surgery

## 2021-03-06 NOTE — Progress Notes (Signed)
Office Visit Note   Patient: Kristen Watkins           Date of Birth: 1986/05/19           MRN: [id number] Visit Date: 03/05/2021 Requested by: Anders Simmonds, PA-C [address] [place],  [place] [address] PCP: Hoy Register, MD  Subjective: Chief Complaint  Patient presents with   Right Knee - Pain   Left Knee - Pain    HPI: Kristen Watkins is a 35 y.o. female who presents to the office complaining of bilateral knee pain.  Patient complains of pain for about a year.  She has had insidious gradual onset of pain with steadily worsening pain over the last year.  Localizes most of her pain to the anterior aspect of the bilateral knees.  Left knee bothers her more than her right knee.  Pain does not wake her up at night.  She does have pain with ambulation and particularly with walking on stairs.  Occasionally her pain will flareup to the point where it bothers her with just walking on flat ground.  Denies any mechanical locking or instability symptoms.  No groin pain or low back pain.  No history of injury.  She has never had surgery on either knee.  Her job involves a lot of walking and occasional lifting.  No history of diabetes.  She takes of ibuprofen and Tylenol as needed with no lasting relief.  Her mother had knee replacement at 52 years of age..                ROS: All systems reviewed are negative as they relate to the chief complaint within the history of present illness.  Patient denies fevers or chills.  Assessment & Plan: Visit Diagnoses:  1. Chronic pain of both knees   2. Bilateral primary osteoarthritis of knee     Plan: Patient is a 35 year old female who presents complaining of bilateral knee pain.  She has a history of insidious onset of knee pain over the last year.  No effusion on exam today and no history of injury.  Radiographs do show early degenerative changes with medial joint space narrowing, slightly worse in the left knee.  There is some mild joint space narrowing  in the patellofemoral compartment and she has associated grinding and positive patellar grind test on exam today.    Impression is early medial compartment and patellofemoral compartment osteoarthritis of both knees, slightly worse in the left knee.  Discussed options available to patient.  After lengthy discussion about the natural history of knee arthritis, patient would like to try meloxicam to take as needed and referral to physical therapy to design a physical therapy program with quad strengthening exercises that are optimal for people with patellofemoral arthritis.  She does not want to try any injections today.  She does have a mother who had knee replacement at age 2.  Recommended she continue therapy exercises that would be given to her and consider weight loss in the future to ease the stress on her knee joints.  Patient understands and agrees with this plan.  Follow-up as needed if meloxicam and PT do not control her pain.  Follow-Up Instructions: No follow-ups on file.   Orders:  Orders Placed This Encounter  Procedures   XR KNEE 3 VIEW RIGHT   XR KNEE 3 VIEW LEFT   Ambulatory referral to Physical Therapy   Meds ordered this encounter  Medications   meloxicam (MOBIC) 15 MG  tablet    Sig: Take 1 tablet (15 mg total) by mouth daily.    Dispense:  30 tablet    Refill:  0      Procedures: No procedures performed   Clinical Data: No additional findings.  Objective: Vital Signs: Ht 5\' 4"  (1.626 m)   Wt 198 lb (89.8 kg)   BMI 33.99 kg/m   Physical Exam:  Constitutional: Patient appears well-developed HEENT:  Head: Normocephalic Eyes:EOM are normal Neck: Normal range of motion Cardiovascular: Normal rate Pulmonary/chest: Effort normal Neurologic: Patient is alert Skin: Skin is warm Psychiatric: Patient has normal mood and affect  Ortho Exam: Ortho exam demonstrates bilateral knees with 0 degrees extension and greater than 120 degrees of knee flexion.  No effusion  bilaterally.  No calf tenderness, negative Homans' sign.  No pain with hip range of motion.  Tenderness over the medial joint line bilaterally with minimal tenderness over the lateral joint line.  Positive patellar grind test in both knees eliciting moderate pain.  Able to perform straight leg raise.  No varus/valgus instability.  No ligamentous laxity with anterior/posterior drawer.  Specialty Comments:  No specialty comments available.  Imaging: No results found.   PMFS History: Patient Active Problem List   Diagnosis Date Noted   Status post vacuum-assisted vaginal delivery 03/28/2018   Breast lump in female 06/28/2014   Past Medical History:  Diagnosis Date   UTI (lower urinary tract infection) 03/01/2014   Serratia and Enterococcus     Family History  Problem Relation Age of Onset   Alcohol abuse Neg Hx    Arthritis Neg Hx    Asthma Neg Hx    Birth defects Neg Hx    Cancer Neg Hx    COPD Neg Hx    Depression Neg Hx    Diabetes Neg Hx    Drug abuse Neg Hx    Early death Neg Hx    Heart disease Neg Hx    Hearing loss Neg Hx    Hyperlipidemia Neg Hx    Hypertension Neg Hx    Kidney disease Neg Hx    Learning disabilities Neg Hx    Mental illness Neg Hx    Mental retardation Neg Hx    Miscarriages / Stillbirths Neg Hx    Stroke Neg Hx    Vision loss Neg Hx    Varicose Veins Neg Hx     Past Surgical History:  Procedure Laterality Date   NO PAST SURGERIES     Social History   Occupational History   Not on file  Tobacco Use   Smoking status: Never   Smokeless tobacco: Never  Substance and Sexual Activity   Alcohol use: No   Drug use: No   Sexual activity: Yes    Birth control/protection: None

## 2021-03-19 ENCOUNTER — Encounter: Payer: Self-pay | Admitting: Physician Assistant

## 2021-04-23 ENCOUNTER — Other Ambulatory Visit: Payer: Self-pay

## 2021-04-23 ENCOUNTER — Ambulatory Visit (INDEPENDENT_AMBULATORY_CARE_PROVIDER_SITE_OTHER): Payer: Medicaid Other | Admitting: Family Medicine

## 2021-04-23 VITALS — BP 111/72 | Wt 203.3 lb

## 2021-04-23 DIAGNOSIS — Z3009 Encounter for other general counseling and advice on contraception: Secondary | ICD-10-CM | POA: Diagnosis not present

## 2021-04-23 DIAGNOSIS — Z3046 Encounter for surveillance of implantable subdermal contraceptive: Secondary | ICD-10-CM | POA: Diagnosis not present

## 2021-04-23 LAB — POCT PREGNANCY, URINE: Preg Test, Ur: NEGATIVE

## 2021-04-23 MED ORDER — NORGESTIMATE-ETH ESTRADIOL 0.25-35 MG-MCG PO TABS: 1.0000 | ORAL_TABLET | Freq: Every day | ORAL | 11 refills | Status: DC

## 2021-04-23 NOTE — Progress Notes (Signed)
Patient here for Nexplanon removal, reports unprotected sex appx 2 weeks ago. UPA negative. Patient started her period on 7/28 and is currently still on cycle. Would like to discuss pill OR depo options for birth control. States she does not want anything long term as she is wanting to become pregnant in the next couple of months.

## 2021-04-24 ENCOUNTER — Encounter: Payer: Self-pay | Admitting: Family Medicine

## 2021-04-24 DIAGNOSIS — Z3046 Encounter for surveillance of implantable subdermal contraceptive: Secondary | ICD-10-CM | POA: Insufficient documentation

## 2021-04-24 NOTE — Progress Notes (Signed)
     GYNECOLOGY OFFICE PROCEDURE NOTE  Kristen Watkins is a 35 y.o. [id number] here for Nexplanon removal.  Last pap smear was on 01/25/2018 and was normal with neg HPV, repeat q5 years. No other gynecologic concerns.  Nexplanon removal Procedure Patient identified, informed consent performed, consent signed.   Appropriate time out taken. Nexplanon site identified.  Area prepped in usual sterile fashon. One ml of 1% lidocaine was used to anesthetize the area at the distal end of the implant. A small stab incision was made right beside the implant on the distal portion.  The Nexplanon rod was grasped using hemostats and removed without difficulty.  There was minimal blood loss. There were no complications.  3 ml of 1% lidocaine was injected around the incision for post-procedure analgesia.  Steri-strips were applied over the small incision.  A pressure bandage was applied to reduce any bruising.  The patient tolerated the procedure well and was given post procedure instructions.  Patient is planning to use OCPs for contraception/attempt conception, a prescription was sent and patient instructed to use backup method for one week.   Venora Maples, MD/[facility], [facility] for [employer], [facility]

## 2021-04-28 DIAGNOSIS — L918 Other hypertrophic disorders of the skin: Secondary | ICD-10-CM | POA: Diagnosis not present

## 2021-04-28 DIAGNOSIS — L821 Other seborrheic keratosis: Secondary | ICD-10-CM | POA: Diagnosis not present

## 2021-05-22 ENCOUNTER — Emergency Department (HOSPITAL_COMMUNITY)
Admission: EM | Admit: 2021-05-22 | Discharge: 2021-05-22 | Disposition: A | Discharge status: Home / Self Care | Payer: Medicaid Other | Attending: Emergency Medicine | Admitting: Emergency Medicine

## 2021-05-22 ENCOUNTER — Other Ambulatory Visit: Payer: Self-pay

## 2021-05-22 ENCOUNTER — Encounter (HOSPITAL_COMMUNITY): Payer: Self-pay | Admitting: Emergency Medicine

## 2021-05-22 ENCOUNTER — Ambulatory Visit: Payer: Self-pay | Admitting: *Deleted

## 2021-05-22 ENCOUNTER — Emergency Department (HOSPITAL_COMMUNITY): Payer: Medicaid Other

## 2021-05-22 DIAGNOSIS — R0789 Other chest pain: Secondary | ICD-10-CM | POA: Diagnosis not present

## 2021-05-22 DIAGNOSIS — M549 Dorsalgia, unspecified: Secondary | ICD-10-CM | POA: Diagnosis not present

## 2021-05-22 DIAGNOSIS — R079 Chest pain, unspecified: Secondary | ICD-10-CM | POA: Diagnosis not present

## 2021-05-22 DIAGNOSIS — M79602 Pain in left arm: Secondary | ICD-10-CM | POA: Insufficient documentation

## 2021-05-22 LAB — BASIC METABOLIC PANEL
Anion gap: 6 (ref 5–15)
BUN: 10 mg/dL (ref 6–20)
CO2: 22 mmol/L (ref 22–32)
Calcium: 8 mg/dL — ABNORMAL LOW (ref 8.9–10.3)
Chloride: 109 mmol/L (ref 98–111)
Creatinine, Ser: 0.44 mg/dL (ref 0.44–1.00)
GFR, Estimated: >60 mL/min (ref >60)
Glucose, Bld: 79 mg/dL (ref 70–99)
Potassium: 3.9 mmol/L (ref 3.5–5.1)
Sodium: 137 mmol/L (ref 135–145)

## 2021-05-22 LAB — CBC
HCT: 35.3 % — ABNORMAL LOW (ref 36.0–46.0)
Hemoglobin: 10.8 g/dL — ABNORMAL LOW (ref 12.0–15.0)
MCH: 26 pg (ref 26.0–34.0)
MCHC: 30.6 g/dL (ref 30.0–36.0)
MCV: 85.1 fL (ref 80.0–100.0)
Platelets: 425 10*3/uL — ABNORMAL HIGH (ref 150–400)
RBC: 4.15 MIL/uL (ref 3.87–5.11)
RDW: 14.4 % (ref 11.5–15.5)
WBC: 6.4 10*3/uL (ref 4.0–10.5)
nRBC: 0 % (ref 0.0–0.2)

## 2021-05-22 LAB — I-STAT BETA HCG BLOOD, ED (MC, WL, AP ONLY): I-stat hCG, quantitative: <5 m[IU]/mL (ref <5)

## 2021-05-22 LAB — TROPONIN I (HIGH SENSITIVITY): Troponin I (High Sensitivity): <2 ng/L (ref <18)

## 2021-05-22 MED ORDER — ASPIRIN 81 MG PO CHEW
324.0000 mg | CHEWABLE_TABLET | Freq: Once | ORAL | Status: AC
  Administered 2021-05-22: 324 mg via ORAL
  Filled 2021-05-22: qty 4

## 2021-05-22 NOTE — Telephone Encounter (Signed)
Reason for Disposition  Pain also in shoulder(s) or arm(s) or jaw (Exception: pain is clearly made worse by movement)  Answer Assessment - Initial Assessment Questions 1. LOCATION: "Where does it hurt?"       Left side 2. RADIATION: "Does the pain go anywhere else?" (e.g., into neck, jaw, arms, back)     Left arm, left shoulder. 3. ONSET: "When did the chest pain begin?" (Minutes, hours or days)      yesterday 4. PATTERN "Does the pain come and go, or has it been constant since it started?"  "Does it get worse with exertion?"      constant 5. DURATION: "How long does it last" (e.g., seconds, minutes, hours)     constant 6. SEVERITY: "How bad is the pain?"  (e.g., Scale 1-10; mild, moderate, or severe)    - MILD (1-3): doesn't interfere with normal activities     - MODERATE (4-7): interferes with normal activities or awakens from sleep    - SEVERE (8-10): excruciating pain, unable to do any normal activities       6-7/10 7. CARDIAC RISK FACTORS: "Do you have any history of heart problems or risk factors for heart disease?" (e.g., angina, prior heart attack; diabetes, high blood pressure, high cholesterol, smoker, or strong family history of heart disease)      8. PULMONARY RISK FACTORS: "Do you have any history of lung disease?"  (e.g., blood clots in lung, asthma, emphysema, birth control pills)      9. CAUSE: "What do you think is causing the chest pain?"      10. OTHER SYMPTOMS: "Do you have any other symptoms?" (e.g., dizziness, nausea, vomiting, sweating, fever, difficulty breathing, cough)      SOB, dizzy yesterday  Protocols used: Chest Pain-A-AH

## 2021-05-22 NOTE — Telephone Encounter (Signed)
Patient has been triaged by [facility] and directed to the ED for evaluation. See triage note in chart.

## 2021-05-22 NOTE — Telephone Encounter (Signed)
Pt reports left sided chest pain, radiates down left arm, onset yesterday. States constant 6-7/10. States this AM pain radiates to left shoulder. Also reports SOB, dizziness. Advised ED, states will follow disposition.

## 2021-05-22 NOTE — ED Provider Notes (Signed)
[place] DEPT Provider Note   CSN: [id number] Arrival date & time: 05/22/21  1005     History Chief Complaint  Patient presents with   Chest Pain    Kristen Watkins is a 35 y.o. female.  Pain radiates to the left upper back.  She feels that she cannot take a deep breath but denies shortness of breath.  Pain is not pleuritic.  She states that it feels like something stops her when she attempts to inhale deeply.   Chest Pain Associated symptoms: no abdominal pain, no back pain, no cough, no fever, no palpitations, no shortness of breath and no vomiting   Risk factors: no birth control, not pregnant and no prior DVT/PE    HPI: A 35 year old patient with a history of obesity presents for evaluation of chest pain. Initial onset of pain was more than 6 hours ago. The patient's chest pain is described as heaviness/pressure/tightness and is not worse with exertion. The patient's chest pain is middle- or left-sided, is not well-localized, is not sharp and does radiate to the arms/jaw/neck. The patient does not complain of nausea and denies diaphoresis. The patient has no history of stroke, has no history of peripheral artery disease, has not smoked in the past 90 days, denies any history of treated diabetes, has no relevant family history of coronary artery disease (first degree relative at less than age 20), is not hypertensive and has no history of hypercholesterolemia.   Past Medical History:  Diagnosis Date   UTI (lower urinary tract infection) 03/01/2014   Serratia and Enterococcus     Patient Active Problem List   Diagnosis Date Noted   Nexplanon removal 04/24/2021   Status post vacuum-assisted vaginal delivery 03/28/2018   Breast lump in female 06/28/2014    Past Surgical History:  Procedure Laterality Date   NO PAST SURGERIES       OB History     Gravida  3   Para  3   Term  3   Preterm  0   AB  0   Living  3      SAB  0   IAB  0    Ectopic  0   Multiple  0   Live Births  3           Family History  Problem Relation Age of Onset   Alcohol abuse Neg Hx    Arthritis Neg Hx    Asthma Neg Hx    Birth defects Neg Hx    Cancer Neg Hx    COPD Neg Hx    Depression Neg Hx    Diabetes Neg Hx    Drug abuse Neg Hx    Early death Neg Hx    Heart disease Neg Hx    Hearing loss Neg Hx    Hyperlipidemia Neg Hx    Hypertension Neg Hx    Kidney disease Neg Hx    Learning disabilities Neg Hx    Mental illness Neg Hx    Mental retardation Neg Hx    Miscarriages / Stillbirths Neg Hx    Stroke Neg Hx    Vision loss Neg Hx    Varicose Veins Neg Hx     Social History   Tobacco Use   Smoking status: Never   Smokeless tobacco: Never  Substance Use Topics   Alcohol use: No   Drug use: No    Home Medications Prior to Admission medications   Medication Sig  Start Date End Date Taking? Authorizing Provider  Cholecalciferol (VITAMIN D3) 2000 units TABS Take 1 tablet by mouth daily.    [provider]  ergocalciferol (DRISDOL) 1.25 MG (50000 UT) capsule Take 1 capsule (50,000 Units total) by mouth once a week. Patient not taking: Reported on 04/23/2021 02/20/21   Argentina Donovan, PA-C  fluticasone [facility]) 50 MCG/ACT nasal spray Place 2 sprays into both nostrils daily. 02/19/21   Argentina Donovan, PA-C  ibuprofen (ADVIL) 600 MG tablet Take 1 tablet (600 mg total) by mouth every 8 (eight) hours as needed. Patient not taking: No sig reported 02/09/20   Charlott Rakes, MD  meloxicam (MOBIC) 15 MG tablet Take 1 tablet (15 mg total) by mouth daily. Patient not taking: Reported on 04/23/2021 03/05/21   Meredith Pel, MD  norgestimate-ethinyl estradiol (ORTHO-CYCLEN) 0.25-35 MG-MCG tablet Take 1 tablet by mouth daily. 04/23/21   Clarnce Flock, MD  olopatadine (PATADAY) 0.1 % ophthalmic solution Place 1 drop into both eyes 2 (two) times daily. Patient not taking: Reported on 04/23/2021 02/19/21   Argentina Donovan, PA-C  Prenatal Vit-Fe Fumarate-FA (PRENATAL VITAMIN PLUS LOW IRON) 27-1 MG TABS Take 1 tablet by mouth daily. Patient not taking: No sig reported 01/25/18   [place]ia Rochester, NP    Allergies    Patient has no known allergies.  Review of Systems   Review of Systems  Constitutional:  Negative for chills and fever.  HENT:  Negative for ear pain and sore throat.   Eyes:  Negative for pain and visual disturbance.  Respiratory:  Negative for cough and shortness of breath.   Cardiovascular:  Positive for chest pain. Negative for palpitations.  Gastrointestinal:  Negative for abdominal pain and vomiting.  Genitourinary:  Negative for dysuria and hematuria.  Musculoskeletal:  Negative for arthralgias and back pain.  Skin:  Negative for color change and rash.  Neurological:  Negative for seizures and syncope.  All other systems reviewed and are negative.  Physical Exam Updated Vital Signs BP (!) 125/56 (BP Location: Left Arm)   Pulse 60   Temp 98 F (36.7 C) (Oral)   Resp 16   LMP 05/11/2021   SpO2 100%   Physical Exam Vitals and nursing note reviewed.  Constitutional:      Appearance: She is well-developed.  HENT:     Head: Normocephalic and atraumatic.  Cardiovascular:     Rate and Rhythm: Normal rate and regular rhythm.     Heart sounds: Normal heart sounds.  Pulmonary:     Effort: Pulmonary effort is normal. No tachypnea.     Breath sounds: Normal breath sounds.  Abdominal:     Palpations: Abdomen is soft.     Tenderness: There is no abdominal tenderness.  Musculoskeletal:     Right lower leg: No edema.     Left lower leg: No edema.  Skin:    General: Skin is warm and dry.  Neurological:     General: No focal deficit present.     Mental Status: She is alert and oriented to person, place, and time.  Psychiatric:        Mood and Affect: Mood normal.        Behavior: Behavior normal.    ED Results / Procedures / Treatments   Labs (all labs ordered are listed,  but only abnormal results are displayed) Labs Reviewed  CBC - Abnormal; Notable for the following components:      Result Value  Hemoglobin 10.8 (*)    HCT 35.3 (*)    Platelets 425 (*)    All other components within normal limits  BASIC METABOLIC PANEL - Abnormal; Notable for the following components:   Calcium 8.0 (*)    All other components within normal limits  I-STAT BETA HCG BLOOD, ED ([facility], [facility], [facility] ONLY)  TROPONIN I (HIGH SENSITIVITY)    EKG EKG Interpretation  Date/Time:  Thursday May 22 2021 10:33:01 EDT Ventricular Rate:  58 PR Interval:  152 QRS Duration: 98 QT Interval:  409 QTC Calculation: 402 R Axis:     Text Interpretation: EASI Derived Leads Abnormal left axis deviation T wave inversions inferiorly T wave flattening lateral V leads Confirmed by Lorre Munroe ([id number]) on 05/22/2021 12:09:14 PM  Radiology DG Chest Port 1 View  Result Date: 05/22/2021 CLINICAL DATA:  Chest pain EXAM: PORTABLE CHEST 1 VIEW COMPARISON:  None. FINDINGS: The heart size and mediastinal contours are within normal limits. Both lungs are clear. The visualized skeletal structures are unremarkable. IMPRESSION: No active disease. Electronically Signed   By: Franchot Gallo M.D.   On: 05/22/2021 11:28    Procedures Procedures   Medications Ordered in ED Medications  aspirin chewable tablet 324 mg (324 mg Oral Given 05/22/21 1043)    ED Course  I have reviewed the triage vital signs and the nursing notes.  Pertinent labs & imaging results that were available during my care of the patient were reviewed by me and considered in my medical decision making (see chart for details).    MDM Rules/Calculators/[name] HEAR Score: 3                         Kristen Watkins presents with left-sided chest pain radiating to her left arm.  ED evaluation was negative for acute ischemia, and according to the heart pathway she met criteria for single troponin value.  She is PERC negative, and further evaluation  for PE was not performed.  We discussed alternative pathology that might explain her symptoms including chest wall pain, gastrointestinal problems, pleurisy.  She was advised to follow closely with her primary care provider. Final Clinical Impression(s) / ED Diagnoses Final diagnoses:  Chest pain, unspecified type    Rx / DC Orders ED Discharge Orders     None        Arnaldo Natal, MD 05/22/21 1223

## 2021-05-22 NOTE — ED Triage Notes (Signed)
PT c/o L chest pain with SOB since last night.

## 2021-05-22 NOTE — Telephone Encounter (Signed)
Husband calling for the pt.  He states she is having left side chest pain, left side pain, and left side hand numbness.  He states the pt is at work, called him to say she is not feeling well.  I asked him if she was coming home.  He said he is not sure, but could he get her an appointment?  I advised him that I would need her to speak with the nurse with the sx she is having. I asked if she can take a call at work.  He said yes.  He also sad he would call her and have her call the office.  I told him that would be great. I verified her phone number as well.

## 2021-05-23 ENCOUNTER — Telehealth: Payer: Self-pay | Admitting: *Deleted

## 2021-05-23 NOTE — Telephone Encounter (Signed)
Pt was seen in [facility] ed yesterday.

## 2021-05-23 NOTE — Telephone Encounter (Signed)
Transition Care Management Follow-up Telephone Call Date of discharge and from where: 05/22/2021 - [facility] How have you been since you were released from the hospital? "Feeling a little better" Any questions or concerns? No  Items Reviewed: Did the pt receive and understand the discharge instructions provided? Yes  Medications obtained and verified?  N/A Other? No  Any new allergies since your discharge? No  Dietary orders reviewed? No Do you have support at home? Yes    Functional Questionnaire: (I = Independent and D = Dependent) ADLs: I  Bathing/Dressing- I  Meal Prep- I  Eating- I  Maintaining continence- I  Transferring/Ambulation- I  Managing Meds- I  Follow up appointments reviewed:  PCP Hospital f/u appt confirmed? No  - advised her that if she continues to have these symptoms she needs to call for an appointment Specialist Hospital f/u appt confirmed? No   Are transportation arrangements needed? No  If their condition worsens, is the pt aware to call PCP or go to the Emergency Dept.? Yes Was the patient provided with contact information for the PCP's office or ED? Yes Was to pt encouraged to call back with questions or concerns? Yes

## 2021-07-10 ENCOUNTER — Encounter: Payer: Self-pay | Admitting: Family Medicine

## 2021-07-15 ENCOUNTER — Other Ambulatory Visit: Payer: Self-pay

## 2021-07-15 ENCOUNTER — Encounter: Payer: Self-pay | Admitting: Family Medicine

## 2021-07-15 ENCOUNTER — Ambulatory Visit (HOSPITAL_BASED_OUTPATIENT_CLINIC_OR_DEPARTMENT_OTHER): Payer: Medicaid Other | Admitting: Family Medicine

## 2021-07-15 DIAGNOSIS — E559 Vitamin D deficiency, unspecified: Secondary | ICD-10-CM | POA: Diagnosis not present

## 2021-07-15 DIAGNOSIS — Z131 Encounter for screening for diabetes mellitus: Secondary | ICD-10-CM

## 2021-07-15 DIAGNOSIS — E669 Obesity, unspecified: Secondary | ICD-10-CM | POA: Diagnosis not present

## 2021-07-15 NOTE — Progress Notes (Signed)
Virtual Visit via Telephone Note  I connected with Kristen Watkins, on 07/15/2021 at 10:07 AM by telephone due to the COVID-19 pandemic and verified that I am speaking with the correct person using two identifiers.   Consent: I discussed the limitations, risks, security and privacy concerns of performing an evaluation and management service by telephone and the availability of in person appointments. I also discussed with the patient that there may be a patient responsible charge related to this service. The patient expressed understanding and agreed to proceed.   Location of Patient: Home  Location of Provider: Clinic   Persons participating in Telemedicine visit: Sybilla Reise Dr. Alvis Lemmings     History of Present Illness: Kristen Watkins is a 35 y.o. year old female with history of bilateral knee pain seen today for an acute visit.    She has struggled with weight loss and would like to try Ozempic.  She has chronic knee pain and is of the opinion that her weight is contributing to her knee pain which does have some degree of truth. A friend of hers in [place] was prescribed Ozempic for weight loss and she would like to have this as well. Last A1c was 5.5 in 02/2021. I have also offered the option of referral to medical weight management as well which she declines.  Review of her labs also reveal vitamin D deficiency from 02/2021 at 24.5.  She completed a course of Drisdol. Past Medical History:  Diagnosis Date   UTI (lower urinary tract infection) 03/01/2014   Serratia and Enterococcus    No Known Allergies  No current outpatient medications on file prior to visit.   No current facility-administered medications on file prior to visit.    ROS: See HPI  Observations/Objective: Awake, alert, oriented x3 Not in acute distress Normal mood  CMP Latest Ref Rng & Units 05/22/2021 11/20/2019 11/04/2016  Glucose 70 - 99 mg/dL 79 85 74  BUN 6 - 20 mg/dL 10 10 7   Creatinine 0.44 - 1.00  mg/dL 4.65) 6.81(E  Sodium 135 - 145 mmol/L 137 139 140  Potassium 3.5 - 5.1 mmol/L 3.9 4.3 4.2  Chloride 98 - 111 mmol/L 109 105 105  CO2 22 - 32 mmol/L 22 20 23   Calcium 8.9 - 10.3 mg/dL 8.0(L) 9.3 9.6  Total Protein 6.0 - 8.5 g/dL - 7.1 7.3  Total Bilirubin 0.0 - 1.2 mg/dL - 0.2 0.5  Alkaline Phos 39 - 117 IU/L - 78 58  AST 0 - 40 IU/L - 15 17  ALT 0 - 32 IU/L - 10 14    Lipid Panel     Component Value Date/Time   CHOL 168 02/19/2021 1553   TRIG 73 02/19/2021 1553   HDL 52 02/19/2021 1553   CHOLHDL 3.2 02/19/2021 1553   CHOLHDL 3.4 11/04/2016 1044   VLDL 15 11/04/2016 1044   LDLCALC 102 (H) 02/19/2021 1553   LABVLDL 14 02/19/2021 1553    Lab Results  Component Value Date   HGBA1C 5.5 02/19/2021    Assessment and Plan: 1. Vitamin D deficiency Completed course of Drisdol We will check vitamin D level again - VITAMIN D 25 Hydroxy (Vit-D Deficiency, Fractures)  2. Screening for diabetes mellitus We will screen for diabetes Advised that it is easier to have her insurance company cover Ozempic if she is prediabetic She would like to be placed on Ozempic for weight loss even if her A1c is normal and is willing to pay out-of-pocket.  Advised  that I would be in touch with her and link her up with the clinical pharmacist to see what co-pay cards available after her results are retrieved. - Hemoglobin A1c  3.  Obesity (BMI 30-39.9) -Advised an option to refer to medical weight management which she declines but would rather do Ozempic See #2 above Portion control, avoid late meals, increase physical activity.  Follow Up Instructions: Schedule appointment if symptoms persist   I discussed the assessment and treatment plan with the patient. The patient was provided an opportunity to ask questions and all were answered. The patient agreed with the plan and demonstrated an understanding of the instructions.   The patient was advised to call back or seek an in-person  evaluation if the symptoms worsen or if the condition fails to improve as anticipated.     I provided 11 minutes total of non-face-to-face time during this encounter.   Hoy Register, MD, FAAFP. [facility] [place], [place] [phone]   07/15/2021, 10:08 AM

## 2021-07-16 ENCOUNTER — Encounter: Payer: Self-pay | Admitting: Family Medicine

## 2021-07-16 ENCOUNTER — Other Ambulatory Visit: Payer: Self-pay | Admitting: Family Medicine

## 2021-07-16 LAB — VITAMIN D 25 HYDROXY (VIT D DEFICIENCY, FRACTURES): Vit D, 25-Hydroxy: 16.5 ng/mL — ABNORMAL LOW (ref 30.0–100.0)

## 2021-07-16 LAB — HEMOGLOBIN A1C
Est. average glucose Bld gHb Est-mCnc: 108 mg/dL
Hgb A1c MFr Bld: 5.4 % (ref 4.8–5.6)

## 2021-07-16 MED ORDER — OZEMPIC (0.25 OR 0.5 MG/DOSE) 2 MG/1.5ML ~~LOC~~ SOPN: 0.5000 mg | PEN_INJECTOR | SUBCUTANEOUS | 3 refills | Status: DC

## 2021-07-16 MED ORDER — ERGOCALCIFEROL 1.25 MG (50000 UT) PO CAPS: 50000.0000 [IU] | ORAL_CAPSULE | ORAL | 1 refills | Status: DC

## 2021-07-23 ENCOUNTER — Ambulatory Visit: Payer: Medicaid Other | Admitting: Physician Assistant

## 2021-08-22 ENCOUNTER — Encounter: Payer: Self-pay | Admitting: Family Medicine

## 2021-08-25 ENCOUNTER — Other Ambulatory Visit: Payer: Self-pay | Admitting: Family Medicine

## 2021-08-25 MED ORDER — SEMAGLUTIDE (1 MG/DOSE) 4 MG/3ML ~~LOC~~ SOPN: 1.0000 mg | PEN_INJECTOR | SUBCUTANEOUS | 3 refills | Status: DC

## 2021-08-26 ENCOUNTER — Other Ambulatory Visit: Payer: Self-pay | Admitting: Family Medicine

## 2021-08-26 MED ORDER — SEMAGLUTIDE (1 MG/DOSE) 4 MG/3ML ~~LOC~~ SOPN: 1.0000 mg | PEN_INJECTOR | SUBCUTANEOUS | 3 refills | Status: DC

## 2021-08-27 ENCOUNTER — Other Ambulatory Visit: Payer: Self-pay | Admitting: Family Medicine

## 2021-08-27 MED ORDER — SEMAGLUTIDE (1 MG/DOSE) 4 MG/3ML ~~LOC~~ SOPN: 1.0000 mg | PEN_INJECTOR | SUBCUTANEOUS | 3 refills | Status: DC

## 2021-08-28 ENCOUNTER — Ambulatory Visit: Payer: Medicaid Other | Admitting: Internal Medicine

## 2021-09-04 ENCOUNTER — Other Ambulatory Visit: Payer: Self-pay | Admitting: Family Medicine

## 2021-09-04 DIAGNOSIS — Z3A01 Less than 8 weeks gestation of pregnancy: Secondary | ICD-10-CM

## 2021-09-09 ENCOUNTER — Other Ambulatory Visit: Payer: Self-pay | Admitting: Family Medicine

## 2021-09-09 MED ORDER — PRENATAL VITAMINS 28-0.8 MG PO TABS: 1.0000 | ORAL_TABLET | Freq: Every day | ORAL | 1 refills | Status: DC

## 2021-10-14 ENCOUNTER — Telehealth (INDEPENDENT_AMBULATORY_CARE_PROVIDER_SITE_OTHER): Payer: Medicaid Other

## 2021-10-14 DIAGNOSIS — O0990 Supervision of high risk pregnancy, unspecified, unspecified trimester: Secondary | ICD-10-CM | POA: Insufficient documentation

## 2021-10-14 DIAGNOSIS — O09529 Supervision of elderly multigravida, unspecified trimester: Secondary | ICD-10-CM | POA: Insufficient documentation

## 2021-10-14 DIAGNOSIS — R112 Nausea with vomiting, unspecified: Secondary | ICD-10-CM

## 2021-10-14 DIAGNOSIS — Z3A00 Weeks of gestation of pregnancy not specified: Secondary | ICD-10-CM

## 2021-10-14 MED ORDER — UNISOM SLEEPTABS 25 MG PO TABS: 25.0000 mg | ORAL_TABLET | Freq: Every evening | ORAL | 0 refills | Status: DC | PRN

## 2021-10-14 MED ORDER — BLOOD PRESSURE MONITORING DEVI
1.0000 | 0 refills | Status: DC
Start: 2021-10-14 — End: 2022-06-02

## 2021-10-14 MED ORDER — VITAMIN B-6 25 MG PO TABS
25.0000 mg | ORAL_TABLET | Freq: Every day | ORAL | 2 refills | Status: DC
Start: 2021-10-14 — End: 2021-10-28

## 2021-10-14 NOTE — Patient Instructions (Signed)
  At our [facility], we work as an integrated team, providing care to address both physical and emotional health. Your medical provider may refer you to see our Behavioral Health Clinician (BHC) on the same day you see your medical provider, as availability permits; often scheduled virtually at your convenience.  Our BHC is available to all patients, visits generally last between 20-30 minutes, but can be longer or shorter, depending on patient need. The BHC offers help with stress management, coping with symptoms of depression and anxiety, major life changes , sleep issues, changing risky behavior, grief and loss, life stress, working on personal life goals, and  behavioral health issues, as these all affect your overall health and wellness.  The BHC is NOT available for the following: FMLA paperwork, court-ordered evaluations, specialty assessments (custody or disability), letters to employers, or obtaining certification for an emotional support animal. The BHC does not provide long-term therapy. You have the right to refuse integrated behavioral health services, or to reschedule to see the BHC at a later date.  Confidentiality exception: If it is suspected that a child or disabled adult is being abused or neglected, we are required by law to report that to either Child Protective Services or Adult Protective Services.  If you have a diagnosis of Bipolar affective disorder, Schizophrenia, or recurrent Major depressive disorder, we will recommend that you establish care with a psychiatrist, as these are lifelong, chronic conditions, and we want your overall emotional health and medications to be more closely monitored. If you anticipate needing extended maternity leave due to mental health issues postpartum, it it recommended you inform your medical provider, so we can put in a referral to a psychiatrist as soon as possible. The BHC is unable to recommend an extended maternity leave for mental  health issues. Your medical provider or BHC may refer you to a therapist for ongoing, traditional therapy, or to a psychiatrist, for medication management, if it would benefit your overall health. Depending on your insurance, you may have a copay or be charged a deductible, depending on your insurance, to see the BHC. If you are uninsured, it is recommended that you apply for financial assistance. (Forms may be requested at the front desk for in-person visits, via MyChart, or request a form during a virtual visit).  If you see the BHC more than 6 times, you will have to complete a comprehensive clinical assessment interview with the BHC to resume integrated services.  For virtual visits with the BHC, you must be physically in the state of [place] at the time of the visit. For example, if you live in [place], you will have to do an in-person visit with the BHC, and your out-of-state insurance may not cover behavioral health services in [place]. If you are going out of the state or country for any reason, the BHC may see you virtually when you return to [place], but not while you are physically outside of [place].    

## 2021-10-14 NOTE — Addendum Note (Signed)
Addended by: Henrietta Dine on: 10/14/2021 03:56 PM   Modules accepted: Orders

## 2021-10-14 NOTE — Progress Notes (Addendum)
New OB Intake  I connected with  Kristen Watkins on 10/14/21 at  3:15 PM EST by MyChart Video Visit and verified that I am speaking with the correct person using two identifiers. Nurse is located at [facility] and pt is located at home.  I discussed the limitations, risks, security and privacy concerns of performing an evaluation and management service by telephone and the availability of in person appointments. I also discussed with the patient that there may be a patient responsible charge related to this service. The patient expressed understanding and agreed to proceed.  I explained I am completing New OB Intake today. We discussed her EDD of 05/07/22 that is based on LMP of 07/31/21. Pt is G4/P3. I reviewed her allergies, medications, Medical/Surgical/OB history, and appropriate screenings. I informed her of [facility] services. Based on history, this is a/an  pregnancy complicated by AMA  .   Patient Active Problem List   Diagnosis Date Noted   Nexplanon removal 04/24/2021   Status post vacuum-assisted vaginal delivery 03/28/2018   Breast lump in female 06/28/2014    Concerns addressed today  Delivery Plans:  Plans to deliver at [facility].   MyChart/Babyscripts MyChart access verified. I explained pt will have some visits in office and some virtually. Babyscripts instructions given and order placed. Patient verifies receipt of registration text/e-mail. Account successfully created and app downloaded.  Blood Pressure Cuff  Blood pressure cuff ordered for patient to pick-up from [employer]. Explained after first prenatal appt pt will check weekly and document in Babyscripts.  Weight scale: Patient does / does not  have weight scale. Weight scale ordered for patient to pick up from [employer].   Anatomy US Explained first scheduled [place] will be around 19 weeks. Anatomy US scheduled for 12/11/21 at 12:45p. Pt notified to arrive at 12:30p.  Labs Discussed Avelina Laine genetic screening with  patient. Would like both Panorama and Horizon drawn at new OB visit. Routine prenatal labs needed.  Covid Vaccine Patient has not covid vaccine.   CenteringPregnancy Candidate?  If yes, offer as possibility  Mother/ Baby Dyad Candidate?    If yes, offer as possibility  Informed patient of [facility] website  and placed link in her AVS.   Social Determinants of Health Food Insecurity: Patient denies food insecurity. WIC Referral: Patient is interested in referral to [facility].  Transportation: Patient denies transportation needs. Childcare: Discussed no children allowed at ultrasound appointments. Offered childcare services; patient declines childcare services at this time.  Send link to Pregnancy Navigators   Placed OB Box on problem list and updated  First visit review I reviewed new OB appt with pt. I explained she will have a pelvic exam, ob bloodwork with genetic screening, and PAP smear. Explained pt will be seen by Nolene Bernheim, NP at first visit; encounter routed to appropriate provider. Explained that patient will be seen by pregnancy navigator following visit with provider. [facility] information placed in AVS.   Henrietta Dine, [facility] 10/14/2021  3:18 PM   Chart reviewed for CMA visit. Agree with plan of care.   Currie Paris, NP 10/14/2021 8:43 PM

## 2021-10-15 ENCOUNTER — Telehealth: Payer: Self-pay | Admitting: *Deleted

## 2021-10-15 ENCOUNTER — Encounter: Payer: Self-pay | Admitting: *Deleted

## 2021-10-15 NOTE — Telephone Encounter (Signed)
I was given Reserve name as a possible candidate for CenteringPregnancy . This wasn't offered to her during her intake with Pam due to [place], but Pam and I discussed AMA does not exclude her. I called and left a message for Reah that I was calling her about a possible appointment . I sent a detailed MyChart message offering CenteringPregnancy Tiago Humphrey,RN

## 2021-10-28 ENCOUNTER — Other Ambulatory Visit (HOSPITAL_COMMUNITY)
Admission: RE | Admit: 2021-10-28 | Discharge: 2021-10-28 | Disposition: A | Discharge status: Home / Self Care | Payer: Medicaid Other | Source: Ambulatory Visit | Attending: Nurse Practitioner | Admitting: Nurse Practitioner

## 2021-10-28 ENCOUNTER — Other Ambulatory Visit: Payer: Self-pay

## 2021-10-28 ENCOUNTER — Encounter: Payer: Self-pay | Admitting: Nurse Practitioner

## 2021-10-28 ENCOUNTER — Ambulatory Visit (INDEPENDENT_AMBULATORY_CARE_PROVIDER_SITE_OTHER): Payer: Medicaid Other | Admitting: Nurse Practitioner

## 2021-10-28 VITALS — BP 124/64 | HR 77 | Wt 213.5 lb

## 2021-10-28 DIAGNOSIS — Z3A12 12 weeks gestation of pregnancy: Secondary | ICD-10-CM

## 2021-10-28 DIAGNOSIS — O09529 Supervision of elderly multigravida, unspecified trimester: Secondary | ICD-10-CM

## 2021-10-28 DIAGNOSIS — O0990 Supervision of high risk pregnancy, unspecified, unspecified trimester: Secondary | ICD-10-CM | POA: Insufficient documentation

## 2021-10-28 DIAGNOSIS — Z31430 Encounter of female for testing for genetic disease carrier status for procreative management: Secondary | ICD-10-CM | POA: Diagnosis not present

## 2021-10-28 DIAGNOSIS — Z6834 Body mass index (BMI) 34.0-34.9, adult: Secondary | ICD-10-CM | POA: Diagnosis not present

## 2021-10-28 NOTE — Progress Notes (Signed)
Subjective:   Kristen Watkins is a 36 y.o. G4P3003 at [redacted]w[redacted]d by LMP being seen today for her first obstetrical visit.  This pregnancy was unexpected but very wanted - she became pregnant while taking OCPs after expired Nexplanon was removed.  Her obstetrical history is significant for advanced maternal age and 3 vaginal births with epidural anesthesia.  Last birth had vaginal vacuum extractor used . Patient does intend to breast feed. Pregnancy history fully reviewed.  Patient reports no complaints.  HISTORY: OB History  Gravida Para Term Preterm AB Living  4 3 3  0 0 3  SAB IAB Ectopic Multiple Live Births  0 0 0 0 3    # Outcome Date GA Lbr Len/2nd Weight Sex Delivery Anes PTL Lv  4 Current           3 Term 03/27/18 [redacted]w[redacted]d 05:24 / 00:31 7 lb 13.8 oz (3.565 kg) F Vag-Vacuum EPI  LIV     Name: [facility]     Apgar1: 8  Apgar5: 9  2 Term 05/01/14 [redacted]w[redacted]d 08:55 / 00:49 8 lb 4.2 oz (3.748 kg) F Vag-Spont EPI  LIV     Name: STAVROULA[facility]     Apgar1: 6  Apgar5: 9  1 Term 03/21/11    M Vag-Spont EPI  LIV   Past Medical History:  Diagnosis Date   UTI (lower urinary tract infection) 03/01/2014   Serratia and Enterococcus    Past Surgical History:  Procedure Laterality Date   NO PAST SURGERIES     Family History  Problem Relation Age of Onset   Alcohol abuse Neg Hx    Arthritis Neg Hx    Asthma Neg Hx    Birth defects Neg Hx    Cancer Neg Hx    COPD Neg Hx    Depression Neg Hx    Diabetes Neg Hx    Drug abuse Neg Hx    Early death Neg Hx    Heart disease Neg Hx    Hearing loss Neg Hx    Hyperlipidemia Neg Hx    Hypertension Neg Hx    Kidney disease Neg Hx    Learning disabilities Neg Hx    Mental illness Neg Hx    Mental retardation Neg Hx    Miscarriages / Stillbirths Neg Hx    Stroke Neg Hx    Vision loss Neg Hx    Varicose Veins Neg Hx    Social History   Tobacco Use   Smoking status: Never   Smokeless tobacco: Never  Substance Use Topics   Alcohol use:  No   Drug use: No   No Known Allergies Current Outpatient Medications on File Prior to Visit  Medication Sig Dispense Refill   Blood Pressure Monitoring DEVI 1 each by Does not apply route once a week. 1 each 0   Prenatal Vit-Fe Fumarate-FA (PRENATAL VITAMINS) 28-0.8 MG TABS Take 1 tablet by mouth daily. 30 tablet 1   No current facility-administered medications on file prior to visit.     Exam   Vitals:   10/28/21 1446  BP: 124/64  Pulse: 77  Weight: 213 lb 8 oz (96.8 kg)   Fetal Heart Rate (bpm): 160  Uterus:     Pelvic Exam: Perineum: deferred   Vulva:    Vagina:     Cervix:    Adnexa:    Bony Pelvis:   System: General: well-developed, well-nourished female in no acute distress   Breast:  normal  appearance, no masses or tenderness   Skin: normal coloration and turgor, no rashes   Neurologic: oriented, normal, negative, normal mood   Extremities: normal strength, tone, and muscle mass, ROM of all joints is normal   HEENT extraocular movement intact and sclera clear, anicteric   Mouth/Teeth deferred   Neck supple and no masses, normal thyroid   Cardiovascular: regular rate and rhythm   Respiratory:  no respiratory distress, normal breath sounds   Abdomen: soft, non-tender; no masses,  no organomegaly     Assessment:   Pregnancy: [id number] Patient Active Problem List   Diagnosis Date Noted   Supervision of high risk pregnancy, antepartum 10/14/2021   AMA (advanced maternal age) multigravida 35+ 10/14/2021   Status post vacuum-assisted vaginal delivery 03/28/2018   Breast lump in female 06/28/2014     Plan:  1. Supervision of high risk pregnancy, antepartum Has had 3 normal pregnancies with vaginal births.  Denies any HTN or GDM with the pregnancies.  Has used epidural anesthesia x 3 and last birth had vaginal vacuum extractor. FHT unable to be heard with doppler.  Office portable US used and FHT heard. Has chronic knee pain - advised ice to her knees at night  whether she is having pain or not for 20 minutes. Discussed Centering Pregnancy but declines group for privacy reasons Plans Nexplanon again after this pregnancy - not interested in BTL Anatomy US scheduled.  - Genetic Screening - GC/Chlamydia probe amp ([place])not at [facility] - Hemoglobin A1c - Culture, OB Urine - CBC/D/Plt+RPR+Rh+ABO+RubIgG...  2. Antepartum multigravida of advanced maternal age Genetic testing to be done  3. BMI 34.0-34.9,adult Had taken Ozempic for weight loss - took only one dose and then realized she was pregnant  4. Breast lump in female Previous problem - no lump identified on exam today - normal exam bilaterally  5.  [redacted] weeks gestation of pregnancy    Initial labs drawn. Continue prenatal vitamins. Genetic Screening discussed, NIPS: ordered. Ultrasound discussed; fetal anatomic survey: ordered. Problem list reviewed and updated. The nature of [facility] with multiple MDs and other Advanced Practice Providers was explained to patient; also emphasized that residents, students are part of our team. Routine obstetric precautions reviewed. Return in about 4 weeks (around 11/25/2021) for ROB in person .  Total face-to-face time with patient: 40 minutes.  Over 50% of encounter was spent on counseling and coordination of care.     Nolene Bernheim, FNP Family Nurse Practitioner, [facility] for [employer], [facility] 10/28/2021 3:43 PM

## 2021-10-28 NOTE — Progress Notes (Signed)
Informal bedside ultrasound performed to assess FHR, FHR 160bpm.

## 2021-10-29 ENCOUNTER — Encounter: Payer: Self-pay | Admitting: *Deleted

## 2021-10-29 LAB — CBC/D/PLT+RPR+RH+ABO+RUBIGG...
Antibody Screen: NEGATIVE
Basophils Absolute: 0.1 10*3/uL (ref 0.0–0.2)
Basos: 1 %
EOS (ABSOLUTE): 0.2 10*3/uL (ref 0.0–0.4)
Eos: 3 %
HCV Ab: <0.1 s/co ratio (ref 0.0–0.9)
HIV Screen 4th Generation wRfx: NONREACTIVE
Hematocrit: 33.1 % — ABNORMAL LOW (ref 34.0–46.6)
Hemoglobin: 11 g/dL — ABNORMAL LOW (ref 11.1–15.9)
Hepatitis B Surface Ag: NEGATIVE
Immature Grans (Abs): 0 10*3/uL (ref 0.0–0.1)
Immature Granulocytes: 0 %
Lymphocytes Absolute: 2.9 10*3/uL (ref 0.7–3.1)
Lymphs: 37 %
MCH: 26.5 pg — ABNORMAL LOW (ref 26.6–33.0)
MCHC: 33.2 g/dL (ref 31.5–35.7)
MCV: 80 fL (ref 79–97)
Monocytes Absolute: 0.5 10*3/uL (ref 0.1–0.9)
Monocytes: 7 %
Neutrophils Absolute: 4 10*3/uL (ref 1.4–7.0)
Neutrophils: 52 %
Platelets: 364 10*3/uL (ref 150–450)
RBC: 4.15 x10E6/uL (ref 3.77–5.28)
RDW: 15 % (ref 11.7–15.4)
RPR Ser Ql: NONREACTIVE
Rh Factor: POSITIVE
Rubella Antibodies, IGG: 6.87 index (ref >0.99)
WBC: 7.7 10*3/uL (ref 3.4–10.8)

## 2021-10-29 LAB — CULTURE, OB URINE

## 2021-10-29 LAB — HCV INTERPRETATION

## 2021-10-29 LAB — GC/CHLAMYDIA PROBE AMP (~~LOC~~) NOT AT ARMC
Chlamydia: NEGATIVE
Comment: NEGATIVE
Comment: NORMAL
Neisseria Gonorrhea: NEGATIVE

## 2021-10-29 LAB — URINE CULTURE, OB REFLEX: Organism ID, Bacteria: NO GROWTH

## 2021-10-29 LAB — HEMOGLOBIN A1C
Est. average glucose Bld gHb Est-mCnc: 108 mg/dL
Hgb A1c MFr Bld: 5.4 % (ref 4.8–5.6)

## 2021-11-25 ENCOUNTER — Ambulatory Visit (INDEPENDENT_AMBULATORY_CARE_PROVIDER_SITE_OTHER): Payer: Medicaid Other | Admitting: Advanced Practice Midwife

## 2021-11-25 ENCOUNTER — Other Ambulatory Visit: Payer: Self-pay

## 2021-11-25 VITALS — BP 123/71 | HR 90 | Wt 216.3 lb

## 2021-11-25 DIAGNOSIS — O0990 Supervision of high risk pregnancy, unspecified, unspecified trimester: Secondary | ICD-10-CM

## 2021-11-25 DIAGNOSIS — O09522 Supervision of elderly multigravida, second trimester: Secondary | ICD-10-CM

## 2021-11-25 DIAGNOSIS — Z3A16 16 weeks gestation of pregnancy: Secondary | ICD-10-CM

## 2021-11-25 NOTE — Progress Notes (Signed)
? ?  PRENATAL VISIT NOTE ? ?Subjective:  ?Kristen Watkins is a 36 y.o. G4P3003 at [redacted]w[redacted]d being seen today for ongoing prenatal care.  She is currently monitored for the following issues for this high-risk pregnancy and has Breast lump in female; Status post vacuum-assisted vaginal delivery; Supervision of high risk pregnancy, antepartum; and AMA (advanced maternal age) multigravida 35+ on their problem list. ? ?Patient reports no complaints.  Contractions: Not present. Vag. Bleeding: None.  Movement: Absent. Denies leaking of fluid.  ? ?The following portions of the patient's history were reviewed and updated as appropriate: allergies, current medications, past family history, past medical history, past social history, past surgical history and problem list. Problem list updated. ? ?Objective:  ? ?Vitals:  ? 11/25/21 1446  ?BP: 123/71  ?Pulse: 90  ?Weight: 216 lb 4.8 oz (98.1 kg)  ? ? ?Fetal Status: Fetal Heart Rate (bpm): 150   Movement: Absent    ? ?General:  Alert, oriented and cooperative. Patient is in no acute distress.  ?Skin: Skin is warm and dry. No rash noted.   ?Cardiovascular: Normal heart rate noted  ?Respiratory: Normal respiratory effort, no problems with respiration noted  ?Abdomen: Soft, gravid, appropriate for gestational age.  Pain/Pressure: Absent     ?Pelvic: Cervical exam deferred        ?Extremities: Normal range of motion.  Edema: None  ?Mental Status: Normal mood and affect. Normal behavior. Normal judgment and thought content.  ? ?Assessment and Plan:  ?Pregnancy: [id number] at [redacted]w[redacted]d ? ?1. Supervision of high risk pregnancy, antepartum ?- Routine care ?- female providers only, sticky note updated. Encouraged patient to schedule multiple appointments to preserve maximum choice in provider and appointment time ?- AFP, Serum, Open Spina Bifida ? ?2. [redacted] weeks gestation of pregnancy ? ? ?3. Multigravida of advanced maternal age in second trimester ?- Age 16 at delivery ? ?Preterm labor symptoms and  general obstetric precautions including but not limited to vaginal bleeding, contractions, leaking of fluid and fetal movement were reviewed in detail with the patient. ?Please refer to After Visit Summary for other counseling recommendations.  ?Return in about 4 weeks (around 12/23/2021) for Female only, MD or APP. ? ?Future Appointments  ?Date Time Provider [place]  ?12/11/2021 12:45 PM [facility] NURSE [facility]  ?12/11/2021  1:00 PM [facility] US1 [facility]  ?12/23/2021 11:15 AM Radene Gunning, MD [facility]  ?01/21/2022 11:15 AM Gabriel Carina, CNM [facility]  ?02/18/2022 11:15 AM Gabriel Carina, CNM [facility]  ? ? ?Darlina Rumpf, CNM ? ?

## 2021-11-27 LAB — AFP, SERUM, OPEN SPINA BIFIDA
AFP MoM: 1.12
AFP Value: 33.1 ng/mL
Gest. Age on Collection Date: 16.5 weeks
Maternal Age At EDD: 36.3 yr
OSBR Risk 1 IN: 8371
Test Results:: NEGATIVE
Weight: 216 [lb_av]

## 2021-12-11 ENCOUNTER — Ambulatory Visit: Payer: Medicaid Other | Admitting: *Deleted

## 2021-12-11 ENCOUNTER — Other Ambulatory Visit: Payer: Self-pay

## 2021-12-11 ENCOUNTER — Other Ambulatory Visit: Payer: Self-pay | Admitting: *Deleted

## 2021-12-11 ENCOUNTER — Ambulatory Visit: Payer: Medicaid Other | Attending: Nurse Practitioner

## 2021-12-11 VITALS — BP 111/77 | HR 75

## 2021-12-11 DIAGNOSIS — O09522 Supervision of elderly multigravida, second trimester: Secondary | ICD-10-CM

## 2021-12-11 DIAGNOSIS — O99212 Obesity complicating pregnancy, second trimester: Secondary | ICD-10-CM

## 2021-12-11 DIAGNOSIS — O09529 Supervision of elderly multigravida, unspecified trimester: Secondary | ICD-10-CM | POA: Diagnosis not present

## 2021-12-11 DIAGNOSIS — O0990 Supervision of high risk pregnancy, unspecified, unspecified trimester: Secondary | ICD-10-CM | POA: Diagnosis not present

## 2021-12-15 ENCOUNTER — Encounter: Payer: Self-pay | Admitting: Medical

## 2021-12-15 DIAGNOSIS — O99210 Obesity complicating pregnancy, unspecified trimester: Secondary | ICD-10-CM | POA: Insufficient documentation

## 2021-12-21 NOTE — Progress Notes (Deleted)
? ?  PRENATAL VISIT NOTE ? ?Subjective:  ?Kristen Watkins is a 36 y.o. G4P3003 at 20w***d being seen today for ongoing prenatal care.  She is currently monitored for the following issues for this low-risk pregnancy and has Breast lump in female; Supervision of high risk pregnancy, antepartum; AMA (advanced maternal age) multigravida 35+; and Obesity in pregnancy, antepartum on their problem list. ? ?Patient reports {[id number]}.   .  .   . Denies leaking of fluid.  ? ?The following portions of the patient's history were reviewed and updated as appropriate: allergies, current medications, past family history, past medical history, past social history, past surgical history and problem list.  ? ?Objective:  ?There were no vitals filed for this visit. ? ?Fetal Status:          ? ?General:  Alert, oriented and cooperative. Patient is in no acute distress.  ?Skin: Skin is warm and dry. No rash noted.   ?Cardiovascular: Normal heart rate noted  ?Respiratory: Normal respiratory effort, no problems with respiration noted  ?Abdomen: Soft, gravid, appropriate for gestational age.        ?Pelvic: Cervical exam deferred        ?Extremities: Normal range of motion.     ?Mental Status: Normal mood and affect. Normal behavior. Normal judgment and thought content.  ? ?Assessment and Plan:  ?Pregnancy: [id number] at 20w***d ?1. Supervision of high risk pregnancy, antepartum ?Anatomy incomplete - f/u 3/23 ?Normal MSAFP ? ?2. Multigravida of advanced maternal age in second trimester ?LR NIPS ? ?3. Obesity in pregnancy, antepartum ? ? ?Preterm labor symptoms and general obstetric precautions including but not limited to vaginal bleeding, contractions, leaking of fluid and fetal movement were reviewed in detail with the patient. ?Please refer to After Visit Summary for other counseling recommendations.  ? ?No follow-ups on file. ? ?Future Appointments  ?Date Time Provider Department Center  ?12/23/2021 11:15 AM Milas Hock, MD [facility]   ?01/08/2022 12:30 PM [facility] NURSE [facility]  ?01/08/2022 12:45 PM [facility] US5 [facility]  ?01/21/2022 11:15 AM Bernerd Limbo, CNM [facility]  ?02/18/2022 11:15 AM Bernerd Limbo, CNM [facility]  ? ? ?Milas Hock, MD ?

## 2021-12-23 ENCOUNTER — Encounter: Payer: Medicaid Other | Admitting: Obstetrics and Gynecology

## 2021-12-23 DIAGNOSIS — O99210 Obesity complicating pregnancy, unspecified trimester: Secondary | ICD-10-CM

## 2021-12-23 DIAGNOSIS — O09522 Supervision of elderly multigravida, second trimester: Secondary | ICD-10-CM

## 2021-12-23 DIAGNOSIS — O0990 Supervision of high risk pregnancy, unspecified, unspecified trimester: Secondary | ICD-10-CM

## 2022-01-08 ENCOUNTER — Ambulatory Visit: Payer: Medicaid Other

## 2022-01-12 ENCOUNTER — Encounter: Payer: Medicaid Other | Admitting: Certified Nurse Midwife

## 2022-01-20 ENCOUNTER — Ambulatory Visit: Payer: Medicaid Other | Attending: Obstetrics

## 2022-01-20 ENCOUNTER — Ambulatory Visit: Payer: Medicaid Other | Admitting: *Deleted

## 2022-01-20 VITALS — BP 111/56 | HR 78

## 2022-01-20 DIAGNOSIS — E669 Obesity, unspecified: Secondary | ICD-10-CM | POA: Diagnosis not present

## 2022-01-20 DIAGNOSIS — O99210 Obesity complicating pregnancy, unspecified trimester: Secondary | ICD-10-CM | POA: Diagnosis present

## 2022-01-20 DIAGNOSIS — O99212 Obesity complicating pregnancy, second trimester: Secondary | ICD-10-CM | POA: Diagnosis not present

## 2022-01-20 DIAGNOSIS — Z3A24 24 weeks gestation of pregnancy: Secondary | ICD-10-CM

## 2022-01-20 DIAGNOSIS — O09522 Supervision of elderly multigravida, second trimester: Secondary | ICD-10-CM | POA: Diagnosis not present

## 2022-01-21 ENCOUNTER — Ambulatory Visit (INDEPENDENT_AMBULATORY_CARE_PROVIDER_SITE_OTHER): Payer: Medicaid Other | Admitting: Certified Nurse Midwife

## 2022-01-21 ENCOUNTER — Other Ambulatory Visit: Payer: Self-pay | Admitting: *Deleted

## 2022-01-21 DIAGNOSIS — O0992 Supervision of high risk pregnancy, unspecified, second trimester: Secondary | ICD-10-CM

## 2022-01-21 DIAGNOSIS — O09522 Supervision of elderly multigravida, second trimester: Secondary | ICD-10-CM

## 2022-01-21 DIAGNOSIS — Z6837 Body mass index (BMI) 37.0-37.9, adult: Secondary | ICD-10-CM

## 2022-01-21 DIAGNOSIS — Z3A24 24 weeks gestation of pregnancy: Secondary | ICD-10-CM

## 2022-01-22 ENCOUNTER — Encounter: Payer: Self-pay | Admitting: Certified Nurse Midwife

## 2022-01-22 ENCOUNTER — Other Ambulatory Visit: Payer: Self-pay | Admitting: Family Medicine

## 2022-01-23 ENCOUNTER — Other Ambulatory Visit: Payer: Self-pay

## 2022-01-23 DIAGNOSIS — O0990 Supervision of high risk pregnancy, unspecified, unspecified trimester: Secondary | ICD-10-CM

## 2022-01-23 MED ORDER — PRENATAL PLUS VITAMIN/MINERAL 27-1 MG PO TABS: 1.0000 | ORAL_TABLET | Freq: Every day | ORAL | 11 refills | Status: DC

## 2022-01-23 NOTE — Progress Notes (Signed)
Canceled.

## 2022-01-23 NOTE — Telephone Encounter (Signed)
Requested medication (s) are due for refill today: yes ? ?Requested medication (s) are on the active medication list: yes   ? ?Last refill: 09/09/21 #30  1 refill ? ?Future visit scheduled no ? ?Notes to clinic:Pt has OB/GYN.   Continue refills from Dr. Alvis Lemmings?  Please review. Thank you ? ?Requested Prescriptions  ?Pending Prescriptions Disp Refills  ? Prenatal Vit-Fe Fumarate-FA (PRENATAL VITAMINS) 28-0.8 MG TABS [Pharmacy Med Name: PRENAVITE TABLETS] 30 tablet 1  ?  Sig: Take 1 tablet by mouth daily.  ?  ? There is no refill protocol information for this order  ?  ? ? ? ? ?

## 2022-02-18 ENCOUNTER — Encounter: Payer: Medicaid Other | Admitting: Certified Nurse Midwife

## 2022-02-18 ENCOUNTER — Ambulatory Visit (INDEPENDENT_AMBULATORY_CARE_PROVIDER_SITE_OTHER): Payer: Medicaid Other | Admitting: Certified Nurse Midwife

## 2022-02-18 VITALS — BP 111/59 | HR 80 | Wt 227.0 lb

## 2022-02-18 DIAGNOSIS — R7989 Other specified abnormal findings of blood chemistry: Secondary | ICD-10-CM

## 2022-02-18 DIAGNOSIS — O09522 Supervision of elderly multigravida, second trimester: Secondary | ICD-10-CM

## 2022-02-18 DIAGNOSIS — O09523 Supervision of elderly multigravida, third trimester: Secondary | ICD-10-CM

## 2022-02-18 DIAGNOSIS — O0990 Supervision of high risk pregnancy, unspecified, unspecified trimester: Secondary | ICD-10-CM

## 2022-02-18 DIAGNOSIS — Z3A28 28 weeks gestation of pregnancy: Secondary | ICD-10-CM

## 2022-02-18 MED ORDER — VITAMIN D3 50 MCG (2000 UT) PO CAPS: 4000.0000 [IU] | ORAL_CAPSULE | Freq: Every day | ORAL | 6 refills | Status: DC

## 2022-02-19 ENCOUNTER — Ambulatory Visit: Payer: Medicaid Other | Admitting: *Deleted

## 2022-02-19 ENCOUNTER — Ambulatory Visit: Payer: Medicaid Other | Attending: Maternal & Fetal Medicine

## 2022-02-19 VITALS — BP 113/55 | HR 76

## 2022-02-19 DIAGNOSIS — E669 Obesity, unspecified: Secondary | ICD-10-CM

## 2022-02-19 DIAGNOSIS — O09522 Supervision of elderly multigravida, second trimester: Secondary | ICD-10-CM | POA: Insufficient documentation

## 2022-02-19 DIAGNOSIS — Z3A29 29 weeks gestation of pregnancy: Secondary | ICD-10-CM | POA: Diagnosis not present

## 2022-02-19 DIAGNOSIS — O99213 Obesity complicating pregnancy, third trimester: Secondary | ICD-10-CM

## 2022-02-19 DIAGNOSIS — Z6837 Body mass index (BMI) 37.0-37.9, adult: Secondary | ICD-10-CM | POA: Insufficient documentation

## 2022-02-19 DIAGNOSIS — O09523 Supervision of elderly multigravida, third trimester: Secondary | ICD-10-CM | POA: Diagnosis not present

## 2022-02-19 DIAGNOSIS — O99210 Obesity complicating pregnancy, unspecified trimester: Secondary | ICD-10-CM | POA: Diagnosis present

## 2022-02-20 ENCOUNTER — Other Ambulatory Visit: Payer: Self-pay | Admitting: *Deleted

## 2022-02-20 DIAGNOSIS — O99213 Obesity complicating pregnancy, third trimester: Secondary | ICD-10-CM

## 2022-02-20 DIAGNOSIS — O09523 Supervision of elderly multigravida, third trimester: Secondary | ICD-10-CM

## 2022-02-22 NOTE — Progress Notes (Signed)
   PRENATAL VISIT NOTE  Subjective:  Kristen Watkins is a 35 y.o. G4P3003 at [redacted]w[redacted]d being seen today for ongoing prenatal care.  She is currently monitored for the following issues for this high-risk pregnancy and has Breast lump in female; Supervision of high risk pregnancy, antepartum; AMA (advanced maternal age) multigravida 35+; and Obesity in pregnancy, antepartum on their problem list.  Patient reports no complaints.  Contractions: Not present. Vag. Bleeding: None.  Movement: Present. Denies leaking of fluid.   The following portions of the patient's history were reviewed and updated as appropriate: allergies, current medications, past family history, past medical history, past social history, past surgical history and problem list.   Objective:   Vitals:   02/18/22 1457  BP: (!) 111/59  Pulse: 80  Weight: 227 lb (103 kg)    Fetal Status: Fetal Heart Rate (bpm): 145 Fundal Height: 30 cm Movement: Present     General:  Alert, oriented and cooperative. Patient is in no acute distress.  Skin: Skin is warm and dry. No rash noted.   Cardiovascular: Normal heart rate noted  Respiratory: Normal respiratory effort, no problems with respiration noted  Abdomen: Soft, gravid, appropriate for gestational age.  Pain/Pressure: Present     Pelvic: Cervical exam deferred        Extremities: Normal range of motion.  Edema: Trace  Mental Status: Normal mood and affect. Normal behavior. Normal judgment and thought content.   Assessment and Plan:  Pregnancy: G4P3003 at [redacted]w[redacted]d 1. Supervision of high risk pregnancy, antepartum - Doing well, feeling regular and vigorous fetal movement   2. [redacted] weeks gestation of pregnancy - Routine OB care   3. Multigravida of advanced maternal age in third trimester - Taking daily ASA  4. Low vitamin D level - Cholecalciferol (VITAMIN D3) 50 MCG (2000 UT) capsule; Take 2 capsules (4,000 Units total) by mouth daily. (Patient not taking: Reported on 02/19/2022)   Dispense: 60 capsule; Refill: 6  Preterm labor symptoms and general obstetric precautions including but not limited to vaginal bleeding, contractions, leaking of fluid and fetal movement were reviewed in detail with the patient. Please refer to After Visit Summary for other counseling recommendations.   Return in about 2 weeks (around 03/04/2022) for IN-PERSON, LOB/GTT.  Future Appointments  Date Time Provider [place] [facility]  03/20/2022  8:35 AM Osborne Oman, MD [facility]  03/20/2022  9:30 AM [facility] LAB [facility]  04/02/2022  9:30 AM [facility] NURSE [facility]  04/02/2022  9:45 AM [facility] US5 [facility]  04/02/2022  1:35 PM Donnamae Jude, MD [facility]  04/09/2022  1:15 PM Anyanwu, Sallyanne Havers, MD [facility]    Gabriel Carina, CNM

## 2022-03-20 ENCOUNTER — Other Ambulatory Visit: Payer: Medicaid Other

## 2022-03-20 ENCOUNTER — Encounter: Payer: Self-pay | Admitting: Obstetrics & Gynecology

## 2022-03-20 ENCOUNTER — Ambulatory Visit (INDEPENDENT_AMBULATORY_CARE_PROVIDER_SITE_OTHER): Payer: Medicaid Other | Admitting: Obstetrics & Gynecology

## 2022-03-20 ENCOUNTER — Other Ambulatory Visit: Payer: Self-pay

## 2022-03-20 VITALS — BP 109/75 | HR 92

## 2022-03-20 DIAGNOSIS — O0990 Supervision of high risk pregnancy, unspecified, unspecified trimester: Secondary | ICD-10-CM | POA: Diagnosis not present

## 2022-03-20 DIAGNOSIS — Z3A33 33 weeks gestation of pregnancy: Secondary | ICD-10-CM

## 2022-03-20 DIAGNOSIS — R7989 Other specified abnormal findings of blood chemistry: Secondary | ICD-10-CM

## 2022-03-20 DIAGNOSIS — O09523 Supervision of elderly multigravida, third trimester: Secondary | ICD-10-CM | POA: Diagnosis not present

## 2022-03-20 NOTE — Patient Instructions (Addendum)
Return to office for any scheduled appointments. Call the office or go to the MAU at [facility] if: You begin to have strong, frequent contractions Your water breaks.  Sometimes it is a big gush of fluid, sometimes it is just a trickle that keeps getting your underwear wet or running down your legs You have vaginal bleeding.  It is normal to have a small amount of spotting if your cervix was checked.  You do not feel your baby moving like normal.  If you do not, get something to eat and drink and lay down and focus on feeling your baby move.   If your baby is still not moving like normal, you should call the office or go to MAU. Any other obstetric concerns.   TDaP Vaccine Pregnancy Get the Whooping Cough Vaccine While You Are Pregnant (CDC)  It is important for women to get the whooping cough vaccine in the third trimester of each pregnancy. Vaccines are the best way to prevent this disease. There are 2 different whooping cough vaccines. Both vaccines combine protection against whooping cough, tetanus and diphtheria, but they are for different age groups: Tdap: for everyone 11 years or older, including pregnant women  DTaP: for children 2 months through 6 years of age  You need the whooping cough vaccine during each of your pregnancies The recommended time to get the shot is during your 27th through 36th week of pregnancy, preferably during the earlier part of this time period. The Centers for Disease Control and Prevention (CDC) recommends that pregnant women receive the whooping cough vaccine for adolescents and adults (called Tdap vaccine) during the third trimester of each pregnancy. The recommended time to get the shot is during your 27th through 36th week of pregnancy, preferably during the earlier part of this time period. This replaces the original recommendation that pregnant women get the vaccine only if they had not previously received it. The American  College of Obstetricians and Gynecologists and the American College of Nurse-Midwives support this recommendation.  You should get the whooping cough vaccine while pregnant to pass protection to your baby frame support disabled and/or not supported in this browser  Learn why Kristen Watkins decided to get the whooping cough vaccine in her 3rd trimester of pregnancy and how her baby girl was born with some protection against the disease. Also available on YouTube. After receiving the whooping cough vaccine, your body will create protective antibodies (proteins produced by the body to fight off diseases) and pass some of them to your baby before birth. These antibodies provide your baby some short-term protection against whooping cough in early life. These antibodies can also protect your baby from some of the more serious complications that come along with whooping cough. Your protective antibodies are at their highest about 2 weeks after getting the vaccine, but it takes time to pass them to your baby. So the preferred time to get the whooping cough vaccine is early in your third trimester. The amount of whooping cough antibodies in your body decreases over time. That is why CDC recommends you get a whooping cough vaccine during each pregnancy. Doing so allows each of your babies to get the greatest number of protective antibodies from you. This means each of your babies will get the best protection possible against this disease.  Getting the whooping cough vaccine while pregnant is better than getting the vaccine after you give birth Whooping cough vaccination during pregnancy is ideal so   your baby will have short-term protection as soon as he is born. This early protection is important because your baby will not start getting his whooping cough vaccines until he is 2 months old. These first few months of life are when your baby is at greatest risk for catching whooping cough. This is also when he's at greatest  risk for having severe, potentially life-threating complications from the infection. To avoid that gap in protection, it is best to get a whooping cough vaccine during pregnancy. You will then pass protection to your baby before he is born. To continue protecting your baby, he should get whooping cough vaccines starting at 2 months old. You may never have gotten the Tdap vaccine before and did not get it during this pregnancy. If so, you should make sure to get the vaccine immediately after you give birth, before leaving the hospital or birthing center. It will take about 2 weeks before your body develops protection (antibodies) in response to the vaccine. Once you have protection from the vaccine, you are less likely to give whooping cough to your newborn while caring for him. But remember, your baby will still be at risk for catching whooping cough from others. A recent study looked to see how effective Tdap was at preventing whooping cough in babies whose mothers got the vaccine while pregnant or in the hospital after giving birth. The study found that getting Tdap between 27 through 36 weeks of pregnancy is 85% more effective at preventing whooping cough in babies younger than 2 months old. Blood tests cannot tell if you need a whooping cough vaccine There are no blood tests that can tell you if you have enough antibodies in your body to protect yourself or your baby against whooping cough. Even if you have been sick with whooping cough in the past or previously received the vaccine, you still should get the vaccine during each pregnancy. Breastfeeding may pass some protective antibodies onto your baby By breastfeeding, you may pass some antibodies you have made in response to the vaccine to your baby. When you get a whooping cough vaccine during your pregnancy, you will have antibodies in your breast milk that you can share with your baby as soon as your milk comes in. However, your baby will not get  protective antibodies immediately if you wait to get the whooping cough vaccine until after delivering your baby. This is because it takes about 2 weeks for your body to create antibodies. Learn more about the health benefits of breastfeeding.  

## 2022-03-20 NOTE — Progress Notes (Signed)
PRENATAL VISIT NOTE  Subjective:  Kristen Watkins is a 36 y.o. G4P3003 at [redacted]w[redacted]d being seen today for ongoing prenatal care.  She is currently monitored for the following issues for this high-risk pregnancy and has Breast lump in female; Supervision of high risk pregnancy, antepartum; AMA (advanced maternal age) multigravida 35+; and Obesity in pregnancy, antepartum on their problem list.  Patient reports nausea and new skin rash on the neck .  Contractions: Not present. Vag. Bleeding: None.  Movement: Present. Denies leaking of fluid.   The following portions of the patient's history were reviewed and updated as appropriate: allergies, current medications, past family history, past medical history, past social history, past surgical history and problem list.   Objective:   Vitals:   03/20/22 0849  BP: 109/75  Pulse: 92    Fetal Status: Fetal Heart Rate (bpm): 133   Movement: Present     General:  Alert, oriented and cooperative. Patient is in no acute distress.  Skin: Skin is warm and dry. Acrochordon noted on neck and discussed with patient.   Cardiovascular: Normal heart rate noted  Respiratory: Normal respiratory effort, no problems with respiration noted  Abdomen: Soft, gravid, appropriate for gestational age.  Pain/Pressure: Absent     Pelvic: Cervical exam deferred        Extremities: Normal range of motion.     Mental Status: Normal mood and affect. Normal behavior. Normal judgment and thought content.   [place] MFM OB FOLLOW UP  Result Date: 02/19/2022 ----------------------------------------------------------------------  OBSTETRICS REPORT                       (Signed Final 02/19/2022 04:14 pm) ---------------------------------------------------------------------- Patient Info  ID #:       [id number]                          D.O.B.:  [birth year] (36 yrs)  Name:       [name]                     Visit Date: 02/19/2022 03:31 pm  ---------------------------------------------------------------------- Performed By  Attending:        Ma Rings MD         Ref. Address:     Faculty Practice  Performed By:     Burt Knack RDMS     [facility]  Referred By:      Virginia Rochester NP ---------------------------------------------------------------------- Orders  #  Description                           Code        Ordered By  1  [place] MFM OB FOLLOW UP                   [id number]    Sander Nephew ----------------------------------------------------------------------  #  Order #                     Accession #                Episode #  1  [id number]                   [id number]                 [id number] ---------------------------------------------------------------------- Indications  Advanced maternal age multigravida 47+,        O62.523  third trimester (36)  Obesity complicating pregnancy, third          O99.213  trimester (BMI 37)  [redacted] weeks gestation of pregnancy                Z3A.29  LR NIPS, Neg Horizon, Neg AFP ---------------------------------------------------------------------- Vital Signs  BP:          113/55 ---------------------------------------------------------------------- Fetal Evaluation  Num Of Fetuses:         1  Fetal Heart Rate(bpm):  151  Cardiac Activity:       Observed  Presentation:           Breech  Placenta:               Posterior  P. Cord Insertion:      Visualized  Amniotic Fluid  AFI FV:      Within normal limits  AFI Sum(cm)     %Tile       Largest Pocket(cm)  20.28           80          6.95  RUQ(cm)       RLQ(cm)       LUQ(cm)        LLQ(cm)  4.77          6.95          1.88           6.68  ---------------------------------------------------------------------- Biometry  BPD:      76.5  mm     G. Age:  30w 5d         86  %    CI:        70.23   %    70 - 86  FL/HC:      18.6   %    19.6 - 20.8  HC:    291.11   mm     G. Age:  32w 1d         94  %    HC/AC:      1.12        0.99 - 1.21  AC:    259.43   mm     G. Age:  30w 1d         76  %    FL/BPD:     71.0   %    71 - 87  FL:      54.28  mm     G. Age:  28w 5d         27  %    FL/AC:      20.9   %    20 - 24  Est. FW:    1472  gm      3 lb 4 oz     70  % ---------------------------------------------------------------------- OB History  Gravidity:    4         Term:   3  Living:       3 ---------------------------------------------------------------------- Gestational Age  LMP:           29w 0d        Date:  07/31/21                 EDD:   05/07/22  U/S Today:     30w 3d                                        EDD:   04/27/22  Best:          29w 0d     Det. By:  LMP  (07/31/21)          EDD:   05/07/22 ---------------------------------------------------------------------- Anatomy  Cranium:               Appears normal         Aortic Arch:            Previously seen  Cavum:                 Appears normal         Ductal Arch:            Previously seen  Ventricles:            Appears normal         Diaphragm:              Appears normal  Choroid Plexus:        Previously seen        Stomach:                Appears normal, left                                                                        sided  Cerebellum:            Previously  seen        Abdomen:                Appears normal  Posterior Fossa:       Previously seen        Abdominal Wall:         Appears nml (cord                                                                        insert, abd wall)  Nuchal Fold:           Not applicable ([id number]    Cord Vessels:           Appears normal ([redacted]                         wks GA)                                         vessel cord)  Face:                  Appears normal         Kidneys:                Appear normal                         (orbits and profile)  Lips:                  Previously seen        Bladder:                Appears normal  Thoracic:              Appears normal         Spine:                  Previously seen  Heart:                 Appears normal         Upper Extremities:      Previously seen                         (4CH, axis, and                         situs)  RVOT:                  Appears normal         Lower Extremities:      Previously seen  LVOT:                  Appears normal  Other:  Fetus appears to be a female.VC, 3VV and 3VTV previously visualized. ---------------------------------------------------------------------- Comments  This patient was seen for a follow up growth scan due to  advanced maternal age.  She denies any problems since her  last exam.  She was informed that the fetal growth and amniotic fluid  level appears appropriate for her gestational age.  Due to advanced maternal age, a follow-up growth scan  scheduled in 6 weeks. ----------------------------------------------------------------------                   Johnell Comings, MD Electronically Signed Final Report   02/19/2022 04:14 pm ----------------------------------------------------------------------   Assessment and Plan:  Pregnancy: [id number] at [redacted]w[redacted]d 1. Low vitamin D level She has previously been treated with PO supplements and was recently told by pharmacy to use OTC. We will retest vitamin D levels and prescribe pending results. - VITAMIN D 25 Hydroxy (Vit-D Deficiency, Fractures)  2. Multigravida of advanced maternal age in third trimester - 36 yo at time of delivery - Normal growth scan recently, repeat scan ordered for 04/02/22  3. [redacted] weeks gestation of pregnancy - 2hr GTT and other labs today, will follow up results and manage accordingly. - discussed TDAP, declined at this time, information  given and complications discussed.  4. Supervision of high risk pregnancy, antepartum Preterm labor symptoms and general obstetric precautions including but not limited to vaginal bleeding, contractions, leaking of fluid and fetal movement were reviewed in detail with the patient. Please refer to After Visit Summary for other counseling recommendations.   Return in about 2 weeks (around 04/03/2022) for OFFICE OB VISIT (MD or APP).  Future Appointments  Date Time Provider Nicholas  04/02/2022  9:30 AM [facility] NURSE [facility]  04/02/2022  9:45 AM [facility] US5 [facility]  04/02/2022  1:35 PM Donnamae Jude, MD [facility]  04/09/2022  1:15 PM Anyanwu, Sallyanne Havers, MD [facility]    Hilbert Odor, PA-S

## 2022-03-20 NOTE — Progress Notes (Signed)
Patient stated that she is feeling fine, just tired.

## 2022-03-21 LAB — CBC
Hematocrit: 35.9 % (ref 34.0–46.6)
Hemoglobin: 12 g/dL (ref 11.1–15.9)
MCH: 27.8 pg (ref 26.6–33.0)
MCHC: 33.4 g/dL (ref 31.5–35.7)
MCV: 83 fL (ref 79–97)
Platelets: 302 10*3/uL (ref 150–450)
RBC: 4.31 x10E6/uL (ref 3.77–5.28)
RDW: 13.2 % (ref 11.7–15.4)
WBC: 8.9 10*3/uL (ref 3.4–10.8)

## 2022-03-21 LAB — FERRITIN: Ferritin: 60 ng/mL (ref 15–150)

## 2022-03-21 LAB — GLUCOSE TOLERANCE, 2 HOURS W/ 1HR
Glucose, 1 hour: 158 mg/dL (ref 70–179)
Glucose, 2 hour: 111 mg/dL (ref 70–152)
Glucose, Fasting: 81 mg/dL (ref 70–91)

## 2022-03-21 LAB — VITAMIN D 25 HYDROXY (VIT D DEFICIENCY, FRACTURES): Vit D, 25-Hydroxy: 16.3 ng/mL — ABNORMAL LOW (ref 30.0–100.0)

## 2022-03-21 LAB — RPR: RPR Ser Ql: NONREACTIVE

## 2022-03-21 LAB — HIV ANTIBODY (ROUTINE TESTING W REFLEX): HIV Screen 4th Generation wRfx: NONREACTIVE

## 2022-03-23 MED ORDER — VITAMIN D (ERGOCALCIFEROL) 50000 UNITS PO CAPS: 1.0000 | ORAL_CAPSULE | ORAL | 3 refills | Status: DC

## 2022-03-23 NOTE — Addendum Note (Signed)
Addended by: Jaynie Collins A on: 03/23/2022 01:08 PM   Modules accepted: Orders

## 2022-04-02 ENCOUNTER — Ambulatory Visit: Payer: Medicaid Other

## 2022-04-02 ENCOUNTER — Encounter: Payer: Medicaid Other | Admitting: Family Medicine

## 2022-04-09 ENCOUNTER — Ambulatory Visit (INDEPENDENT_AMBULATORY_CARE_PROVIDER_SITE_OTHER): Payer: Medicaid Other | Admitting: Obstetrics & Gynecology

## 2022-04-09 ENCOUNTER — Other Ambulatory Visit: Payer: Self-pay

## 2022-04-09 ENCOUNTER — Encounter: Payer: Self-pay | Admitting: Obstetrics & Gynecology

## 2022-04-09 ENCOUNTER — Other Ambulatory Visit (HOSPITAL_COMMUNITY)
Admission: RE | Admit: 2022-04-09 | Discharge: 2022-04-09 | Disposition: A | Discharge status: Home / Self Care | Payer: Medicaid Other | Source: Ambulatory Visit | Attending: Obstetrics & Gynecology | Admitting: Obstetrics & Gynecology

## 2022-04-09 VITALS — BP 120/50 | HR 72 | Wt 235.0 lb

## 2022-04-09 DIAGNOSIS — O0990 Supervision of high risk pregnancy, unspecified, unspecified trimester: Secondary | ICD-10-CM | POA: Diagnosis not present

## 2022-04-09 DIAGNOSIS — Z3A36 36 weeks gestation of pregnancy: Secondary | ICD-10-CM

## 2022-04-09 DIAGNOSIS — R7989 Other specified abnormal findings of blood chemistry: Secondary | ICD-10-CM | POA: Insufficient documentation

## 2022-04-09 DIAGNOSIS — O09523 Supervision of elderly multigravida, third trimester: Secondary | ICD-10-CM

## 2022-04-09 NOTE — Patient Instructions (Signed)
Return to office for any scheduled appointments. Call the office or go to the MAU at [facility] if: You begin to have strong, frequent contractions Your water breaks.  Sometimes it is a big gush of fluid, sometimes it is just a trickle that keeps getting your underwear wet or running down your legs You have vaginal bleeding.  It is normal to have a small amount of spotting if your cervix was checked.  You do not feel your baby moving like normal.  If you do not, get something to eat and drink and lay down and focus on feeling your baby move.   If your baby is still not moving like normal, you should call the office or go to MAU. Any other obstetric concerns.  

## 2022-04-09 NOTE — Progress Notes (Signed)
   PRENATAL VISIT NOTE  Subjective:  Kristen Watkins is a 36 y.o. G4P3003 at [redacted]w[redacted]d being seen today for ongoing prenatal care.  She is currently monitored for the following issues for this high-risk pregnancy and has Breast lump in female; Supervision of high risk pregnancy, antepartum; AMA (advanced maternal age) multigravida 35+; Obesity in pregnancy, antepartum; and Low vitamin D level on their problem list.  Patient reports no complaints.  Contractions: Not present. Vag. Bleeding: None.  Movement: Present. Denies leaking of fluid.   The following portions of the patient's history were reviewed and updated as appropriate: allergies, current medications, past family history, past medical history, past social history, past surgical history and problem list.   Objective:   Vitals:   04/09/22 1323  BP: (!) 120/50  Pulse: 72  Weight: 235 lb (106.6 kg)    Fetal Status: Fetal Heart Rate (bpm): 153   Movement: Present  Presentation: Vertex  General:  Alert, oriented and cooperative. Patient is in no acute distress.  Skin: Skin is warm and dry. No rash noted.   Cardiovascular: Normal heart rate noted  Respiratory: Normal respiratory effort, no problems with respiration noted  Abdomen: Soft, gravid, appropriate for gestational age.  Pain/Pressure: Absent     Pelvic: Cervical exam performed in the presence of a chaperone Dilation: Closed Effacement (%): Thick Station: Ballotable. Cultures obtained.  Extremities: Normal range of motion.     Mental Status: Normal mood and affect. Normal behavior. Normal judgment and thought content.   Assessment and Plan:  Pregnancy: G4P3003 at [redacted]w[redacted]d 1. Multigravida of advanced maternal age in third trimester Growth scan scheduled 04/13/22, will follow up results and manage accordingly.  2. [redacted] weeks gestation of pregnancy 3. Supervision of high risk pregnancy, antepartum Pelvic cultures done today, will follow up results and manage accordingly. - Culture, beta  strep (group b only) - Cervicovaginal ancillary only( [place]) Labor symptoms and general obstetric precautions including but not limited to vaginal bleeding, contractions, leaking of fluid and fetal movement were reviewed in detail with the patient. Please refer to After Visit Summary for other counseling recommendations.   Return in about 1 week (around 04/16/2022) for OFFICE OB VISIT (MD only).  Future Appointments  Date Time Provider Department Center  04/13/2022 10:15 AM [facility] NURSE [facility]  04/13/2022 10:30 AM [facility] US2 [facility]    Jaynie Collins, MD

## 2022-04-10 LAB — CERVICOVAGINAL ANCILLARY ONLY
Chlamydia: NEGATIVE
Comment: NEGATIVE
Comment: NORMAL
Neisseria Gonorrhea: NEGATIVE

## 2022-04-12 ENCOUNTER — Encounter: Payer: Self-pay | Admitting: Obstetrics & Gynecology

## 2022-04-12 DIAGNOSIS — O99820 Streptococcus B carrier state complicating pregnancy: Secondary | ICD-10-CM | POA: Insufficient documentation

## 2022-04-12 LAB — CULTURE, BETA STREP (GROUP B ONLY): Strep Gp B Culture: POSITIVE — AB

## 2022-04-13 ENCOUNTER — Ambulatory Visit: Payer: Medicaid Other | Attending: Obstetrics | Admitting: *Deleted

## 2022-04-13 ENCOUNTER — Ambulatory Visit (HOSPITAL_BASED_OUTPATIENT_CLINIC_OR_DEPARTMENT_OTHER): Payer: Medicaid Other

## 2022-04-13 ENCOUNTER — Encounter: Payer: Self-pay | Admitting: *Deleted

## 2022-04-13 VITALS — BP 109/52 | HR 74

## 2022-04-13 DIAGNOSIS — O99210 Obesity complicating pregnancy, unspecified trimester: Secondary | ICD-10-CM

## 2022-04-13 DIAGNOSIS — E669 Obesity, unspecified: Secondary | ICD-10-CM

## 2022-04-13 DIAGNOSIS — Z3A36 36 weeks gestation of pregnancy: Secondary | ICD-10-CM | POA: Insufficient documentation

## 2022-04-13 DIAGNOSIS — O09523 Supervision of elderly multigravida, third trimester: Secondary | ICD-10-CM | POA: Insufficient documentation

## 2022-04-13 DIAGNOSIS — O99213 Obesity complicating pregnancy, third trimester: Secondary | ICD-10-CM | POA: Insufficient documentation

## 2022-04-20 ENCOUNTER — Encounter: Payer: Medicaid Other | Admitting: Obstetrics and Gynecology

## 2022-04-27 ENCOUNTER — Encounter: Payer: Medicaid Other | Admitting: Obstetrics & Gynecology

## 2022-04-30 ENCOUNTER — Other Ambulatory Visit: Payer: Self-pay

## 2022-04-30 ENCOUNTER — Ambulatory Visit (INDEPENDENT_AMBULATORY_CARE_PROVIDER_SITE_OTHER): Payer: Medicaid Other | Admitting: Family Medicine

## 2022-04-30 VITALS — BP 122/65 | HR 83 | Wt 237.0 lb

## 2022-04-30 DIAGNOSIS — O0990 Supervision of high risk pregnancy, unspecified, unspecified trimester: Secondary | ICD-10-CM

## 2022-04-30 DIAGNOSIS — O09523 Supervision of elderly multigravida, third trimester: Secondary | ICD-10-CM

## 2022-04-30 DIAGNOSIS — O99820 Streptococcus B carrier state complicating pregnancy: Secondary | ICD-10-CM

## 2022-04-30 NOTE — Progress Notes (Signed)
   PRENATAL VISIT NOTE  Subjective:  Kristen Watkins is a 36 y.o. G4P3003 at [redacted]w[redacted]d being seen today for ongoing prenatal care.  She is currently monitored for the following issues for this low-risk pregnancy and has Supervision of high risk pregnancy, antepartum; AMA (advanced maternal age) multigravida 35+; Obesity in pregnancy, antepartum; Low vitamin D level; and Group B Streptococcus carrier, +RV culture, currently pregnant on their problem list.  Patient reports no complaints.  Contractions: Irritability. Vag. Bleeding: None.  Movement: Present. Denies leaking of fluid.   The following portions of the patient's history were reviewed and updated as appropriate: allergies, current medications, past family history, past medical history, past social history, past surgical history and problem list.   Objective:   Vitals:   04/30/22 1613  BP: 122/65  Pulse: 83  Weight: 237 lb (107.5 kg)    Fetal Status: Fetal Heart Rate (bpm): 170 Fundal Height: 38 cm Movement: Present  Presentation: Vertex  General:  Alert, oriented and cooperative. Patient is in no acute distress.  Skin: Skin is warm and dry. No rash noted.   Cardiovascular: Normal heart rate noted  Respiratory: Normal respiratory effort, no problems with respiration noted  Abdomen: Soft, gravid, appropriate for gestational age.  Pain/Pressure: Present     Pelvic: Cervical exam performed in the presence of a chaperone Dilation: 2 Effacement (%): 20, 30 Station: Ballotable, -3  Extremities: Normal range of motion.  Edema: None  Mental Status: Normal mood and affect. Normal behavior. Normal judgment and thought content.   Assessment and Plan:  Pregnancy: G4P3003 at [redacted]w[redacted]d 1. Supervision of high risk pregnancy, antepartum Continue routine prenatal care.  2. Multigravida of advanced maternal age in third trimester LR NIPT  3. Group B Streptococcus carrier, +RV culture, currently pregnant Will need treatment in labor  Preterm labor  symptoms and general obstetric precautions including but not limited to vaginal bleeding, contractions, leaking of fluid and fetal movement were reviewed in detail with the patient. Please refer to After Visit Summary for other counseling recommendations.   Return in 1 week (on 05/07/2022) for OB visit and BPP.  Future Appointments  Date Time Provider Department Center  05/04/2022  2:15 PM Milas Hock, MD [facility]  05/11/2022  1:15 PM Reva Bores, MD [facility]  05/11/2022  2:15 PM [facility]    Reva Bores, MD

## 2022-05-02 NOTE — Progress Notes (Unsigned)
   PRENATAL VISIT NOTE  Subjective:  Kristen Watkins is a 36 y.o. G4P3003 at [redacted]w[redacted]d being seen today for ongoing prenatal care.  She is currently monitored for the following issues for this low-risk pregnancy and has Supervision of high risk pregnancy, antepartum; AMA (advanced maternal age) multigravida 35+; Obesity in pregnancy, antepartum; Low vitamin D level; and Group B Streptococcus carrier, +RV culture, currently pregnant on their problem list.  Patient reports no complaints.  Contractions: Irritability. Vag. Bleeding: None.  Movement: Present. Denies leaking of fluid.   The following portions of the patient's history were reviewed and updated as appropriate: allergies, current medications, past family history, past medical history, past social history, past surgical history and problem list.   Objective:   Vitals:   05/04/22 1416  BP: (!) 114/55  Pulse: 78  Weight: 231 lb 11.2 oz (105.1 kg)    Fetal Status: Fetal Heart Rate (bpm): 163 Fundal Height: 39 cm Movement: Present  Presentation: Vertex  General:  Alert, oriented and cooperative. Patient is in no acute distress.  Skin: Skin is warm and dry. No rash noted.   Cardiovascular: Normal heart rate noted  Respiratory: Normal respiratory effort, no problems with respiration noted  Abdomen: Soft, gravid, appropriate for gestational age.  Pain/Pressure: Present     Pelvic: Cervical exam performed in the presence of a chaperone Dilation: 3 Effacement (%): 40 Station: Ballotable  Extremities: Normal range of motion.  Edema: Trace  Mental Status: Normal mood and affect. Normal behavior. Normal judgment and thought content.   Assessment and Plan:  Pregnancy: G4P3003 at [redacted]w[redacted]d 1. Supervision of high risk pregnancy, antepartum Discussed timing of delivery - pt prefers IOL earlier. Reviewed available dates - she would like Sunday. Orders already in by Dr. Shawnie Pons.  2. Multigravida of advanced maternal age in third trimester  3. Obesity in  pregnancy, antepartum  4. Group B Streptococcus carrier, +RV culture, currently pregnant PCN in labor  Term labor symptoms and general obstetric precautions including but not limited to vaginal bleeding, contractions, leaking of fluid and fetal movement were reviewed in detail with the patient. Please refer to After Visit Summary for other counseling recommendations.   No follow-ups on file.  Future Appointments  Date Time Provider Department Center  05/10/2022  7:15 AM [facility] SCHED ROOM [facility] None  05/11/2022  1:15 PM Reva Bores, MD [facility]  05/11/2022  2:15 PM [facility] NST [facility]    Milas Hock, MD

## 2022-05-04 ENCOUNTER — Other Ambulatory Visit: Payer: Self-pay

## 2022-05-04 ENCOUNTER — Ambulatory Visit (INDEPENDENT_AMBULATORY_CARE_PROVIDER_SITE_OTHER): Payer: Medicaid Other | Admitting: Obstetrics and Gynecology

## 2022-05-04 VITALS — BP 114/55 | HR 78 | Wt 231.7 lb

## 2022-05-04 DIAGNOSIS — O99820 Streptococcus B carrier state complicating pregnancy: Secondary | ICD-10-CM

## 2022-05-04 DIAGNOSIS — O99210 Obesity complicating pregnancy, unspecified trimester: Secondary | ICD-10-CM

## 2022-05-04 DIAGNOSIS — O09523 Supervision of elderly multigravida, third trimester: Secondary | ICD-10-CM

## 2022-05-04 DIAGNOSIS — O0990 Supervision of high risk pregnancy, unspecified, unspecified trimester: Secondary | ICD-10-CM

## 2022-05-05 ENCOUNTER — Encounter (HOSPITAL_COMMUNITY): Payer: Self-pay

## 2022-05-05 ENCOUNTER — Telehealth (HOSPITAL_COMMUNITY): Payer: Self-pay | Admitting: *Deleted

## 2022-05-05 ENCOUNTER — Encounter (HOSPITAL_COMMUNITY): Payer: Self-pay | Admitting: *Deleted

## 2022-05-05 NOTE — Telephone Encounter (Signed)
Preadmission screen  

## 2022-05-06 ENCOUNTER — Encounter: Payer: Self-pay | Admitting: Obstetrics and Gynecology

## 2022-05-06 ENCOUNTER — Other Ambulatory Visit: Payer: Self-pay | Admitting: Advanced Practice Midwife

## 2022-05-10 ENCOUNTER — Inpatient Hospital Stay (HOSPITAL_COMMUNITY): Payer: Medicaid Other

## 2022-05-10 ENCOUNTER — Encounter: Payer: Self-pay | Admitting: Radiology

## 2022-05-10 ENCOUNTER — Other Ambulatory Visit: Payer: Self-pay

## 2022-05-10 ENCOUNTER — Encounter (HOSPITAL_COMMUNITY): Payer: Self-pay | Admitting: Family Medicine

## 2022-05-10 ENCOUNTER — Inpatient Hospital Stay (HOSPITAL_COMMUNITY): Admission: AD | Admit: 2022-05-10 | Payer: Medicaid Other | Source: Home / Self Care | Admitting: Family Medicine

## 2022-05-10 ENCOUNTER — Inpatient Hospital Stay (EMERGENCY_DEPARTMENT_HOSPITAL)
Admission: AD | Admit: 2022-05-10 | Discharge: 2022-05-10 | Disposition: A | Discharge status: Home / Self Care | Payer: Medicaid Other | Source: Home / Self Care | Attending: Family Medicine | Admitting: Family Medicine

## 2022-05-10 ENCOUNTER — Encounter: Payer: Self-pay | Admitting: Obstetrics and Gynecology

## 2022-05-10 ENCOUNTER — Inpatient Hospital Stay (HOSPITAL_COMMUNITY)
Admission: RE | Admit: 2022-05-10 | Discharge: 2022-05-14 | DRG: 787 | Disposition: A | Discharge status: Home / Self Care | Payer: Medicaid Other | Attending: Family Medicine | Admitting: Family Medicine

## 2022-05-10 DIAGNOSIS — G5710 Meralgia paresthetica, unspecified lower limb: Secondary | ICD-10-CM | POA: Diagnosis not present

## 2022-05-10 DIAGNOSIS — O471 False labor at or after 37 completed weeks of gestation: Secondary | ICD-10-CM | POA: Insufficient documentation

## 2022-05-10 DIAGNOSIS — O9081 Anemia of the puerperium: Secondary | ICD-10-CM | POA: Diagnosis not present

## 2022-05-10 DIAGNOSIS — Z3A40 40 weeks gestation of pregnancy: Secondary | ICD-10-CM

## 2022-05-10 DIAGNOSIS — O3663X0 Maternal care for excessive fetal growth, third trimester, not applicable or unspecified: Secondary | ICD-10-CM | POA: Diagnosis not present

## 2022-05-10 DIAGNOSIS — O99824 Streptococcus B carrier state complicating childbirth: Secondary | ICD-10-CM | POA: Diagnosis not present

## 2022-05-10 DIAGNOSIS — O09523 Supervision of elderly multigravida, third trimester: Secondary | ICD-10-CM | POA: Insufficient documentation

## 2022-05-10 DIAGNOSIS — R262 Difficulty in walking, not elsewhere classified: Secondary | ICD-10-CM | POA: Diagnosis not present

## 2022-05-10 DIAGNOSIS — O99214 Obesity complicating childbirth: Secondary | ICD-10-CM | POA: Diagnosis not present

## 2022-05-10 DIAGNOSIS — M5126 Other intervertebral disc displacement, lumbar region: Secondary | ICD-10-CM | POA: Diagnosis not present

## 2022-05-10 DIAGNOSIS — O99210 Obesity complicating pregnancy, unspecified trimester: Secondary | ICD-10-CM

## 2022-05-10 DIAGNOSIS — O480 Post-term pregnancy: Principal | ICD-10-CM | POA: Diagnosis present

## 2022-05-10 DIAGNOSIS — D62 Acute posthemorrhagic anemia: Secondary | ICD-10-CM | POA: Diagnosis not present

## 2022-05-10 DIAGNOSIS — O479 False labor, unspecified: Secondary | ICD-10-CM | POA: Diagnosis not present

## 2022-05-10 DIAGNOSIS — R531 Weakness: Secondary | ICD-10-CM | POA: Diagnosis not present

## 2022-05-10 DIAGNOSIS — O3663X1 Maternal care for excessive fetal growth, third trimester, fetus 1: Secondary | ICD-10-CM | POA: Diagnosis not present

## 2022-05-10 DIAGNOSIS — E669 Obesity, unspecified: Secondary | ICD-10-CM | POA: Diagnosis not present

## 2022-05-10 DIAGNOSIS — Z30017 Encounter for initial prescription of implantable subdermal contraceptive: Secondary | ICD-10-CM

## 2022-05-10 DIAGNOSIS — O3660X0 Maternal care for excessive fetal growth, unspecified trimester, not applicable or unspecified: Secondary | ICD-10-CM | POA: Insufficient documentation

## 2022-05-10 DIAGNOSIS — O99893 Other specified diseases and conditions complicating puerperium: Secondary | ICD-10-CM | POA: Diagnosis not present

## 2022-05-10 DIAGNOSIS — O99213 Obesity complicating pregnancy, third trimester: Secondary | ICD-10-CM | POA: Insufficient documentation

## 2022-05-10 DIAGNOSIS — O0990 Supervision of high risk pregnancy, unspecified, unspecified trimester: Secondary | ICD-10-CM

## 2022-05-10 DIAGNOSIS — Z3493 Encounter for supervision of normal pregnancy, unspecified, third trimester: Secondary | ICD-10-CM

## 2022-05-10 LAB — CBC
HCT: 37.1 % (ref 36.0–46.0)
Hemoglobin: 12.3 g/dL (ref 12.0–15.0)
MCH: 28.4 pg (ref 26.0–34.0)
MCHC: 33.2 g/dL (ref 30.0–36.0)
MCV: 85.7 fL (ref 80.0–100.0)
Platelets: 280 10*3/uL (ref 150–400)
RBC: 4.33 MIL/uL (ref 3.87–5.11)
RDW: 14.7 % (ref 11.5–15.5)
WBC: 7 10*3/uL (ref 4.0–10.5)
nRBC: 0 % (ref 0.0–0.2)

## 2022-05-10 LAB — POCT FERN TEST: POCT Fern Test: NEGATIVE

## 2022-05-10 MED ORDER — SOD CITRATE-CITRIC ACID 500-334 MG/5ML PO SOLN
30.0000 mL | ORAL | Status: DC | PRN
Start: 2022-05-10 — End: 2022-05-11
  Administered 2022-05-11: 30 mL via ORAL
  Filled 2022-05-10: qty 30

## 2022-05-10 MED ORDER — LACTATED RINGERS IV SOLN: 500.0000 mL | INTRAVENOUS | Status: DC | PRN

## 2022-05-10 MED ORDER — LACTATED RINGERS IV SOLN: INTRAVENOUS | Status: DC

## 2022-05-10 MED ORDER — TERBUTALINE SULFATE 1 MG/ML IJ SOLN
0.2500 mg | Freq: Once | INTRAMUSCULAR | Status: DC | PRN
Start: 2022-05-10 — End: 2022-05-11

## 2022-05-10 MED ORDER — OXYTOCIN BOLUS FROM INFUSION: 333.0000 mL | Freq: Once | INTRAVENOUS | Status: DC

## 2022-05-10 MED ORDER — PENICILLIN G POT IN DEXTROSE 60000 UNIT/ML IV SOLN
3.0000 10*6.[IU] | INTRAVENOUS | Status: DC
  Administered 2022-05-11 (×3): 3 10*6.[IU] via INTRAVENOUS
  Filled 2022-05-10 (×3): qty 50

## 2022-05-10 MED ORDER — OXYCODONE-ACETAMINOPHEN 5-325 MG PO TABS: 2.0000 | ORAL_TABLET | ORAL | Status: DC | PRN

## 2022-05-10 MED ORDER — DIPHENHYDRAMINE HCL 50 MG/ML IJ SOLN: 12.5000 mg | INTRAMUSCULAR | Status: DC | PRN

## 2022-05-10 MED ORDER — MISOPROSTOL 25 MCG QUARTER TABLET
25.0000 ug | ORAL_TABLET | Freq: Once | ORAL | Status: AC
  Administered 2022-05-10: 25 ug via ORAL
  Filled 2022-05-10: qty 1

## 2022-05-10 MED ORDER — OXYTOCIN-SODIUM CHLORIDE 30-0.9 UT/500ML-% IV SOLN
2.5000 [IU]/h | INTRAVENOUS | Status: DC
  Filled 2022-05-10: qty 500

## 2022-05-10 MED ORDER — TERBUTALINE SULFATE 1 MG/ML IJ SOLN: 0.2500 mg | Freq: Once | INTRAMUSCULAR | Status: DC | PRN

## 2022-05-10 MED ORDER — LACTATED RINGERS IV SOLN: 500.0000 mL | Freq: Once | INTRAVENOUS | Status: DC

## 2022-05-10 MED ORDER — PENICILLIN G POTASSIUM 5000000 UNITS IJ SOLR
5.0000 10*6.[IU] | Freq: Once | INTRAMUSCULAR | Status: AC
  Administered 2022-05-10: 5 10*6.[IU] via INTRAVENOUS
  Filled 2022-05-10: qty 5

## 2022-05-10 MED ORDER — EPHEDRINE 5 MG/ML INJ
10.0000 mg | INTRAVENOUS | Status: AC | PRN
  Administered 2022-05-11 (×2): 10 mg via INTRAVENOUS
  Filled 2022-05-10 (×2): qty 5

## 2022-05-10 MED ORDER — OXYTOCIN-SODIUM CHLORIDE 30-0.9 UT/500ML-% IV SOLN
1.0000 m[IU]/min | INTRAVENOUS | Status: DC
  Administered 2022-05-11: 2 m[IU]/min via INTRAVENOUS

## 2022-05-10 MED ORDER — EPHEDRINE 5 MG/ML INJ
10.0000 mg | INTRAVENOUS | Status: AC | PRN
  Administered 2022-05-11 (×2): 10 mg via INTRAVENOUS

## 2022-05-10 MED ORDER — FENTANYL CITRATE (PF) 100 MCG/2ML IJ SOLN
50.0000 ug | INTRAMUSCULAR | Status: DC | PRN
  Administered 2022-05-11: 50 ug via INTRAVENOUS
  Filled 2022-05-10 (×2): qty 2

## 2022-05-10 MED ORDER — ONDANSETRON HCL 4 MG/2ML IJ SOLN
4.0000 mg | Freq: Four times a day (QID) | INTRAMUSCULAR | Status: DC | PRN
  Administered 2022-05-11: 4 mg via INTRAVENOUS
  Filled 2022-05-10: qty 2

## 2022-05-10 MED ORDER — PHENYLEPHRINE 80 MCG/ML (10ML) SYRINGE FOR IV PUSH (FOR BLOOD PRESSURE SUPPORT)
80.0000 ug | PREFILLED_SYRINGE | INTRAVENOUS | Status: AC | PRN
Start: 2022-05-10 — End: 2022-05-11
  Administered 2022-05-11 (×3): 80 ug via INTRAVENOUS

## 2022-05-10 MED ORDER — PHENYLEPHRINE 80 MCG/ML (10ML) SYRINGE FOR IV PUSH (FOR BLOOD PRESSURE SUPPORT)
80.0000 ug | PREFILLED_SYRINGE | INTRAVENOUS | Status: DC | PRN
Start: 2022-05-10 — End: 2022-05-11
  Administered 2022-05-11: 80 ug via INTRAVENOUS
  Filled 2022-05-10: qty 10

## 2022-05-10 MED ORDER — OXYCODONE-ACETAMINOPHEN 5-325 MG PO TABS: 1.0000 | ORAL_TABLET | ORAL | Status: DC | PRN

## 2022-05-10 MED ORDER — MISOPROSTOL 25 MCG QUARTER TABLET
25.0000 ug | ORAL_TABLET | Freq: Once | ORAL | Status: AC
  Administered 2022-05-10: 25 ug via VAGINAL
  Filled 2022-05-10: qty 1

## 2022-05-10 MED ORDER — FLEET ENEMA 7-19 GM/118ML RE ENEM: 1.0000 | ENEMA | RECTAL | Status: DC | PRN

## 2022-05-10 MED ORDER — ACETAMINOPHEN 325 MG PO TABS: 650.0000 mg | ORAL_TABLET | ORAL | Status: DC | PRN

## 2022-05-10 MED ORDER — FENTANYL-BUPIVACAINE-NACL 0.5-0.125-0.9 MG/250ML-% EP SOLN
12.0000 mL/h | EPIDURAL | Status: DC | PRN
  Filled 2022-05-10: qty 250

## 2022-05-10 MED ORDER — LIDOCAINE HCL (PF) 1 % IJ SOLN: 30.0000 mL | INTRAMUSCULAR | Status: DC | PRN

## 2022-05-10 NOTE — MAU Note (Signed)
Kristen Watkins is a 36 y.o. at [redacted]w[redacted]d here in MAU reporting: having contractions since 0300.  Now every 20-11min. Was for induction this morning, had not been called yet. (Over due and AMA). ? Water leaking at 0700, none since then. No bleeding.  Reports +FM Was 3cm last Monday  Onset of complaint: 0300 Pain score: 7 Vitals:   05/10/22 1043  BP: 124/73  Pulse: 88  Resp: 18  Temp: 98 F (36.7 C)  SpO2: 100%     CBJ:SEGBT on, reports +FM Lab orders placed from triage:  fern

## 2022-05-10 NOTE — MAU Provider Note (Signed)
History     CSN: [id number]  Arrival date and time: 05/10/22 0955   None     Chief Complaint  Patient presents with   Rupture of Membranes   Contractions    Ms. Kristen Watkins is a 36 y.o. G4P3003 at [redacted]w[redacted]d  who presents to MAU today complaining contractions since 0300. She denies vaginal bleeding. She endorses LOF at 0700 but did not continue. She reports normal fetal movement.      OB History     Gravida  4   Para  3   Term  3   Preterm  0   AB  0   Living  3      SAB  0   IAB  0   Ectopic  0   Multiple  0   Live Births  3           Past Medical History:  Diagnosis Date   Breast lump in female 06/28/2014   Biopsy done at radiology-->benign lymph node Unchanged 01-25-18   UTI (lower urinary tract infection) 03/01/2014   Serratia and Enterococcus     Past Surgical History:  Procedure Laterality Date   NO PAST SURGERIES      Family History  Problem Relation Age of Onset   Alcohol abuse Neg Hx    Arthritis Neg Hx    Asthma Neg Hx    Birth defects Neg Hx    Cancer Neg Hx    COPD Neg Hx    Depression Neg Hx    Diabetes Neg Hx    Drug abuse Neg Hx    Early death Neg Hx    Heart disease Neg Hx    Hearing loss Neg Hx    Hyperlipidemia Neg Hx    Hypertension Neg Hx    Kidney disease Neg Hx    Learning disabilities Neg Hx    Mental illness Neg Hx    Mental retardation Neg Hx    Miscarriages / Stillbirths Neg Hx    Stroke Neg Hx    Vision loss Neg Hx    Varicose Veins Neg Hx     Social History   Tobacco Use   Smoking status: Never   Smokeless tobacco: Never  Vaping Use   Vaping Use: Never used  Substance Use Topics   Alcohol use: No   Drug use: No    Allergies: No Known Allergies  Medications Prior to Admission  Medication Sig Dispense Refill Last Dose   Blood Pressure Monitoring DEVI 1 each by Does not apply route once a week. 1 each 0    Prenatal Vit-Fe Fumarate-FA (PRENATAL PLUS VITAMIN/MINERAL) 27-1 MG TABS Take 1 tablet  by mouth daily. 30 tablet 11    Vitamin D, Ergocalciferol, 50000 units CAPS Take 1 capsule by mouth once a week. 12 capsule 3     Review of Systems  Respiratory:  Negative for shortness of breath.   Cardiovascular:  Negative for chest pain.  Genitourinary:  Positive for vaginal discharge. Negative for difficulty urinating, pelvic pain, vaginal bleeding and vaginal pain.  Neurological:  Negative for headaches.  All other systems reviewed and are negative.  Physical Exam   Blood pressure 124/73, pulse 88, temperature 98 F (36.7 C), temperature source Oral, resp. rate 18, height 5\' 6"  (1.676 m), weight 109.7 kg, last menstrual period 07/31/2021, SpO2 100 %, currently breastfeeding. NST:  Baseline: 120 bpm, Variability: Good {> 6 bpm), Accelerations: Reactive, and Decelerations: Absent  Physical Exam Vitals reviewed.  Exam conducted with a chaperone present (Cervical exam: external os 3 cm).  Constitutional:      General: She is not in acute distress.    Appearance: She is not ill-appearing, toxic-appearing or diaphoretic.  Eyes:     Conjunctiva/sclera: Conjunctivae normal.  Cardiovascular:     Comments: Regular rate noted Abdominal:     Comments: gravid  Genitourinary:    Vagina: Normal.  Musculoskeletal:     Right lower leg: No edema.     Left lower leg: No edema.  Neurological:     Mental Status: She is alert and oriented to person, place, and time.  Psychiatric:        Mood and Affect: Mood normal.        Behavior: Behavior normal.     MAU Course   Negative fern and spec.   Assessment and Plan  1. Obesity in pregnancy, antepartum  2. False labor -Return precautions given.   3. Intact amniotic membranes during pregnancy in third trimester Negative fern, negative speculum exam  4. [redacted] weeks gestation of pregnancy -Scheduled for induction today; has not yet received a call.  -Will send message to office to consider scheduling follow up this week if unable to get in  for induction.   Lavonda Jumbo, DO 05/10/2022 1:29 PM   Selmer Adduci Autry-Lott 05/10/2022, 1:19 PM

## 2022-05-10 NOTE — H&P (Signed)
OBSTETRIC ADMISSION HISTORY AND PHYSICAL  Kristen Watkins is a 36 y.o. female [phone] with IUP at [redacted]w[redacted]d by LMP presenting for IOL for postdates and AMA . She reports +FMs, No LOF, no VB, no blurry vision, headaches or peripheral edema, and RUQ pain.  She plans on breast and formula feeding. She request IP nexplanon for birth control. She received her prenatal care at [facility]   Dating: By LMP --->  Estimated Date of Delivery: 05/07/22  Sono:    @[redacted]w[redacted]d , CWD, normal anatomy, cephalic presentation, posterior placenta, 3659g, 97% EFW   Prenatal History/Complications: AMA, GBS +,   Past Medical History: Past Medical History:  Diagnosis Date   Breast lump in female 06/28/2014   Biopsy done at radiology-->benign lymph node Unchanged 01-25-18   UTI (lower urinary tract infection) 03/01/2014   Serratia and Enterococcus     Past Surgical History: Past Surgical History:  Procedure Laterality Date   NO PAST SURGERIES      Obstetrical History: OB History     Gravida  4   Para  3   Term  3   Preterm  0   AB  0   Living  3      SAB  0   IAB  0   Ectopic  0   Multiple  0   Live Births  3           Social History Social History   Socioeconomic History   Marital status: Married    Spouse name: Not on file   Number of children: Not on file   Years of education: Not on file   Highest education level: Not on file  Occupational History   Not on file  Tobacco Use   Smoking status: Never   Smokeless tobacco: Never  Vaping Use   Vaping Use: Never used  Substance and Sexual Activity   Alcohol use: No   Drug use: No   Sexual activity: Yes    Birth control/protection: None  Other Topics Concern   Not on file  Social History Narrative   Not on file   Social Determinants of Health   Financial Resource Strain: Not on file  Food Insecurity: No Food Insecurity (04/30/2022)   Hunger Vital Sign    Worried About Running Out of Food in the Last Year: Never true    Ran Out  of Food in the Last Year: Never true  Transportation Needs: No Transportation Needs (04/30/2022)   PRAPARE - 06/30/2022 (Medical): No    Lack of Transportation (Non-Medical): No  Physical Activity: Not on file  Stress: Not on file  Social Connections: Not on file    Family History: Family History  Problem Relation Age of Onset   Alcohol abuse Neg Hx    Arthritis Neg Hx    Asthma Neg Hx    Birth defects Neg Hx    Cancer Neg Hx    COPD Neg Hx    Depression Neg Hx    Diabetes Neg Hx    Drug abuse Neg Hx    Early death Neg Hx    Heart disease Neg Hx    Hearing loss Neg Hx    Hyperlipidemia Neg Hx    Hypertension Neg Hx    Kidney disease Neg Hx    Learning disabilities Neg Hx    Mental illness Neg Hx    Mental retardation Neg Hx    Miscarriages / Stillbirths Neg Hx  Stroke Neg Hx    Vision loss Neg Hx    Varicose Veins Neg Hx     Allergies: No Known Allergies  Medications Prior to Admission  Medication Sig Dispense Refill Last Dose   Prenatal Vit-Fe Fumarate-FA (PRENATAL PLUS VITAMIN/MINERAL) 27-1 MG TABS Take 1 tablet by mouth daily. 30 tablet 11 05/09/2022   Vitamin D, Ergocalciferol, 50000 units CAPS Take 1 capsule by mouth once a week. 12 capsule 3 Past Week   Blood Pressure Monitoring DEVI 1 each by Does not apply route once a week. 1 each 0      Review of Systems   All systems reviewed and negative except as stated in HPI  Blood pressure 115/71, pulse 78, temperature 99.2 F (37.3 C), temperature source Oral, resp. rate 18, last menstrual period 07/31/2021, currently breastfeeding. General appearance: alert, cooperative, and appears stated age Lungs: normal work of breathing  Heart: regular rate  Presentation: cephalic Fetal monitoringBaseline: 130 bpm, Variability: Good {> 6 bpm), Accelerations: Reactive, and Decelerations: Absent Uterine activity: irritability  Dilation: 1 Effacement (%): Thick Station: Ballotable Exam  by:: Dorathy Kinsman, CNM   Prenatal labs: ABO, Rh: --/--/O POS (08/20 2011) Antibody: NEG (08/20 2011) Rubella: 6.87 (02/07 1533) RPR: Non Reactive (06/30 0844)  HBsAg: Negative (02/07 1533)  HIV: Non Reactive (06/30 0844)  GBS: Positive/-- (07/20 1405)  2 hr Glucola WNL Genetic screening  LR Anatomy US Normal   Prenatal Transfer Tool  Maternal Diabetes: No Genetic Screening: Normal Maternal Ultrasounds/Referrals: Normal Fetal Ultrasounds or other Referrals:  Referred to Materal Fetal Medicine  Maternal Substance Abuse:  No Significant Maternal Medications:  None Significant Maternal Lab Results: Group B Strep positive  Results for orders placed or performed during the hospital encounter of 05/10/22 (from the past 24 hour(s))  Type and screen   Collection Time: 05/10/22  8:11 PM  Result Value Ref Range   ABO/RH(D) O POS    Antibody Screen NEG    Sample Expiration      05/13/2022,2359 Performed at [facility], [address], [place], [place] [address]   CBC   Collection Time: 05/10/22  8:12 PM  Result Value Ref Range   WBC 7.0 4.0 - 10.5 K/uL   RBC 4.33 3.87 - 5.11 MIL/uL   Hemoglobin 12.3 12.0 - 15.0 g/dL   HCT 93.7 34.2 - 87.6 %   MCV 85.7 80.0 - 100.0 fL   MCH 28.4 26.0 - 34.0 pg   MCHC 33.2 30.0 - 36.0 g/dL   RDW 81.1 57.2 - 62.0 %   Platelets 280 150 - 400 K/uL   nRBC 0.0 0.0 - 0.2 %  Results for orders placed or performed during the hospital encounter of 05/10/22 (from the past 24 hour(s))  Fern Test   Collection Time: 05/10/22  1:24 PM  Result Value Ref Range   POCT Fern Test Negative = intact amniotic membranes     Patient Active Problem List   Diagnosis Date Noted   LGA (large for gestational age) fetus affecting management of mother 05/10/2022   Post term pregnancy over 40 weeks 05/10/2022   Group B Streptococcus carrier, +RV culture, currently pregnant 04/12/2022   Low vitamin D level 04/09/2022   Obesity in pregnancy, antepartum  12/15/2021   Supervision of high risk pregnancy, antepartum 10/14/2021   AMA (advanced maternal age) multigravida 35+ 10/14/2021    Assessment/Plan:  Kristen Watkins is a 36 y.o. G4P3003 at [redacted]w[redacted]d here for IOL for post dates and AMA   #  Labor:Initiating induction with cytotec #Pain: PRN epidural  #FWB: Cat 1 #ID:  GBS positive, PCN  #MOF: both #MOC:nexplanon  #Circ:  Yes   Celedonio Savage, MD  05/10/2022, 9:25 PM

## 2022-05-11 ENCOUNTER — Other Ambulatory Visit: Payer: Self-pay

## 2022-05-11 ENCOUNTER — Encounter (HOSPITAL_COMMUNITY): Payer: Self-pay | Admitting: Family Medicine

## 2022-05-11 ENCOUNTER — Inpatient Hospital Stay (HOSPITAL_COMMUNITY): Payer: Medicaid Other | Admitting: Anesthesiology

## 2022-05-11 ENCOUNTER — Encounter: Payer: Medicaid Other | Admitting: Family Medicine

## 2022-05-11 ENCOUNTER — Encounter (HOSPITAL_COMMUNITY)
Admission: RE | Disposition: A | Discharge status: Home / Self Care | Payer: Self-pay | Source: Home / Self Care | Attending: Family Medicine

## 2022-05-11 ENCOUNTER — Ambulatory Visit: Payer: Medicaid Other

## 2022-05-11 DIAGNOSIS — O480 Post-term pregnancy: Secondary | ICD-10-CM

## 2022-05-11 DIAGNOSIS — Z3A40 40 weeks gestation of pregnancy: Secondary | ICD-10-CM

## 2022-05-11 DIAGNOSIS — O99214 Obesity complicating childbirth: Secondary | ICD-10-CM

## 2022-05-11 DIAGNOSIS — O99824 Streptococcus B carrier state complicating childbirth: Secondary | ICD-10-CM

## 2022-05-11 DIAGNOSIS — O3663X1 Maternal care for excessive fetal growth, third trimester, fetus 1: Secondary | ICD-10-CM

## 2022-05-11 DIAGNOSIS — E669 Obesity, unspecified: Secondary | ICD-10-CM

## 2022-05-11 LAB — DIC (DISSEMINATED INTRAVASCULAR COAGULATION)PANEL
D-Dimer, Quant: 2 ug/mL-FEU — ABNORMAL HIGH (ref 0.00–0.50)
Fibrinogen: 433 mg/dL (ref 210–475)
INR: 1.1 (ref 0.8–1.2)
Platelets: 210 10*3/uL (ref 150–400)
Prothrombin Time: 14.4 seconds (ref 11.4–15.2)
Smear Review: NONE SEEN
aPTT: 26 seconds (ref 24–36)

## 2022-05-11 LAB — PREPARE RBC (CROSSMATCH)

## 2022-05-11 LAB — CBC
HCT: 28.9 % — ABNORMAL LOW (ref 36.0–46.0)
Hemoglobin: 9.4 g/dL — ABNORMAL LOW (ref 12.0–15.0)
MCH: 28.7 pg (ref 26.0–34.0)
MCHC: 32.5 g/dL (ref 30.0–36.0)
MCV: 88.4 fL (ref 80.0–100.0)
Platelets: 209 10*3/uL (ref 150–400)
RBC: 3.27 MIL/uL — ABNORMAL LOW (ref 3.87–5.11)
RDW: 14.7 % (ref 11.5–15.5)
WBC: 12 10*3/uL — ABNORMAL HIGH (ref 4.0–10.5)
nRBC: 0 % (ref 0.0–0.2)

## 2022-05-11 LAB — POSTPARTUM HEMORRHAGE PROTOCOL (BB NOTIFICATION)

## 2022-05-11 LAB — RPR: RPR Ser Ql: NONREACTIVE

## 2022-05-11 SURGERY — Surgical Case
Anesthesia: Epidural

## 2022-05-11 MED ORDER — OXYTOCIN-SODIUM CHLORIDE 30-0.9 UT/500ML-% IV SOLN
INTRAVENOUS | Status: DC | PRN
  Administered 2022-05-11: 500 mL via INTRAVENOUS

## 2022-05-11 MED ORDER — LIDOCAINE-EPINEPHRINE (PF) 2 %-1:200000 IJ SOLN
INTRAMUSCULAR | Status: AC
  Filled 2022-05-11: qty 20

## 2022-05-11 MED ORDER — DIPHENHYDRAMINE HCL 25 MG PO CAPS: 25.0000 mg | ORAL_CAPSULE | Freq: Four times a day (QID) | ORAL | Status: DC | PRN

## 2022-05-11 MED ORDER — OXYCODONE HCL 5 MG PO TABS
5.0000 mg | ORAL_TABLET | ORAL | Status: DC | PRN
  Administered 2022-05-12: 5 mg via ORAL
  Filled 2022-05-11: qty 1

## 2022-05-11 MED ORDER — MORPHINE SULFATE (PF) 0.5 MG/ML IJ SOLN
INTRAMUSCULAR | Status: DC | PRN
  Administered 2022-05-11: 3 mg via EPIDURAL

## 2022-05-11 MED ORDER — TETANUS-DIPHTH-ACELL PERTUSSIS 5-2.5-18.5 LF-MCG/0.5 IM SUSY: 0.5000 mL | PREFILLED_SYRINGE | Freq: Once | INTRAMUSCULAR | Status: DC

## 2022-05-11 MED ORDER — ACETAMINOPHEN 10 MG/ML IV SOLN
INTRAVENOUS | Status: AC
  Filled 2022-05-11: qty 100

## 2022-05-11 MED ORDER — MEPERIDINE HCL 25 MG/ML IJ SOLN: 6.2500 mg | INTRAMUSCULAR | Status: DC | PRN

## 2022-05-11 MED ORDER — FENTANYL CITRATE (PF) 100 MCG/2ML IJ SOLN
INTRAMUSCULAR | Status: AC
  Filled 2022-05-11: qty 2

## 2022-05-11 MED ORDER — FENTANYL-BUPIVACAINE-NACL 0.5-0.125-0.9 MG/250ML-% EP SOLN
EPIDURAL | Status: DC | PRN
  Administered 2022-05-11: 2 mL/h via EPIDURAL

## 2022-05-11 MED ORDER — ONDANSETRON HCL 4 MG/2ML IJ SOLN
INTRAMUSCULAR | Status: AC
  Filled 2022-05-11: qty 2

## 2022-05-11 MED ORDER — TRANEXAMIC ACID-NACL 1000-0.7 MG/100ML-% IV SOLN
INTRAVENOUS | Status: AC
  Filled 2022-05-11: qty 100

## 2022-05-11 MED ORDER — ACETAMINOPHEN 10 MG/ML IV SOLN
1000.0000 mg | Freq: Once | INTRAVENOUS | Status: AC
Start: 2022-05-11 — End: 2022-05-11
  Administered 2022-05-11: 1000 mg via INTRAVENOUS

## 2022-05-11 MED ORDER — ACETAMINOPHEN 500 MG PO TABS
1000.0000 mg | ORAL_TABLET | Freq: Four times a day (QID) | ORAL | Status: DC
  Administered 2022-05-11 – 2022-05-14 (×10): 1000 mg via ORAL
  Filled 2022-05-11 (×11): qty 2

## 2022-05-11 MED ORDER — CEFAZOLIN SODIUM-DEXTROSE 2-4 GM/100ML-% IV SOLN
2.0000 g | INTRAVENOUS | Status: AC
  Administered 2022-05-11 (×2): 2 g via INTRAVENOUS

## 2022-05-11 MED ORDER — OXYTOCIN-SODIUM CHLORIDE 30-0.9 UT/500ML-% IV SOLN
INTRAVENOUS | Status: AC
  Filled 2022-05-11: qty 500

## 2022-05-11 MED ORDER — SODIUM CHLORIDE 0.9% IV SOLUTION: Freq: Once | INTRAVENOUS | Status: DC

## 2022-05-11 MED ORDER — COCONUT OIL OIL: 1.0000 | TOPICAL_OIL | Status: DC | PRN

## 2022-05-11 MED ORDER — ONDANSETRON HCL 4 MG/2ML IJ SOLN
INTRAMUSCULAR | Status: DC | PRN
  Administered 2022-05-11: 4 mg via INTRAVENOUS

## 2022-05-11 MED ORDER — SIMETHICONE 80 MG PO CHEW: 80.0000 mg | CHEWABLE_TABLET | ORAL | Status: DC | PRN

## 2022-05-11 MED ORDER — SIMETHICONE 80 MG PO CHEW
80.0000 mg | CHEWABLE_TABLET | Freq: Three times a day (TID) | ORAL | Status: DC
  Administered 2022-05-11 – 2022-05-14 (×8): 80 mg via ORAL
  Filled 2022-05-11 (×8): qty 1

## 2022-05-11 MED ORDER — DEXAMETHASONE SODIUM PHOSPHATE 4 MG/ML IJ SOLN
INTRAMUSCULAR | Status: AC
  Filled 2022-05-11: qty 1

## 2022-05-11 MED ORDER — SODIUM CHLORIDE 0.9 % IV SOLN: INTRAVENOUS | Status: DC | PRN

## 2022-05-11 MED ORDER — MENTHOL 3 MG MT LOZG: 1.0000 | LOZENGE | OROMUCOSAL | Status: DC | PRN

## 2022-05-11 MED ORDER — METHYLERGONOVINE MALEATE 0.2 MG/ML IJ SOLN
INTRAMUSCULAR | Status: AC
  Filled 2022-05-11: qty 1

## 2022-05-11 MED ORDER — FENTANYL CITRATE (PF) 100 MCG/2ML IJ SOLN
100.0000 ug | Freq: Once | INTRAMUSCULAR | Status: AC
  Administered 2022-05-11: 100 ug via EPIDURAL

## 2022-05-11 MED ORDER — ACETAMINOPHEN 160 MG/5ML PO SOLN: 325.0000 mg | ORAL | Status: DC | PRN

## 2022-05-11 MED ORDER — LIDOCAINE HCL (PF) 1 % IJ SOLN
INTRAMUSCULAR | Status: DC | PRN
  Administered 2022-05-11: 2 mL via EPIDURAL
  Administered 2022-05-11: 10 mL via EPIDURAL

## 2022-05-11 MED ORDER — LACTATED RINGERS IV BOLUS: 1000.0000 mL | Freq: Once | INTRAVENOUS | Status: DC

## 2022-05-11 MED ORDER — ALBUMIN HUMAN 5 % IV SOLN: INTRAVENOUS | Status: DC | PRN

## 2022-05-11 MED ORDER — LACTATED RINGERS IV SOLN: INTRAVENOUS | Status: DC

## 2022-05-11 MED ORDER — CEFAZOLIN SODIUM-DEXTROSE 2-4 GM/100ML-% IV SOLN
INTRAVENOUS | Status: AC
  Filled 2022-05-11: qty 100

## 2022-05-11 MED ORDER — SOD CITRATE-CITRIC ACID 500-334 MG/5ML PO SOLN: 30.0000 mL | ORAL | Status: DC

## 2022-05-11 MED ORDER — PHENYLEPHRINE HCL-NACL 20-0.9 MG/250ML-% IV SOLN
INTRAVENOUS | Status: DC | PRN
  Administered 2022-05-11: 60 ug/min via INTRAVENOUS

## 2022-05-11 MED ORDER — ENOXAPARIN SODIUM 60 MG/0.6ML IJ SOSY
50.0000 mg | PREFILLED_SYRINGE | INTRAMUSCULAR | Status: DC
  Administered 2022-05-12 – 2022-05-14 (×3): 50 mg via SUBCUTANEOUS
  Filled 2022-05-11 (×3): qty 0.6

## 2022-05-11 MED ORDER — OXYTOCIN-SODIUM CHLORIDE 30-0.9 UT/500ML-% IV SOLN: 2.5000 [IU]/h | INTRAVENOUS | Status: AC

## 2022-05-11 MED ORDER — SODIUM BICARBONATE 8.4 % IV SOLN
INTRAVENOUS | Status: DC | PRN
  Administered 2022-05-11 (×3): 5 mL via EPIDURAL

## 2022-05-11 MED ORDER — MORPHINE SULFATE (PF) 0.5 MG/ML IJ SOLN
INTRAMUSCULAR | Status: AC
  Filled 2022-05-11: qty 10

## 2022-05-11 MED ORDER — SODIUM BICARBONATE 8.4 % IV SOLN: INTRAVENOUS | Status: DC | PRN

## 2022-05-11 MED ORDER — SENNOSIDES-DOCUSATE SODIUM 8.6-50 MG PO TABS
2.0000 | ORAL_TABLET | Freq: Every day | ORAL | Status: DC
  Administered 2022-05-12 – 2022-05-14 (×3): 2 via ORAL
  Filled 2022-05-11 (×3): qty 2

## 2022-05-11 MED ORDER — ACETAMINOPHEN 325 MG PO TABS: 325.0000 mg | ORAL_TABLET | ORAL | Status: DC | PRN

## 2022-05-11 MED ORDER — ACETAMINOPHEN 10 MG/ML IV SOLN
INTRAVENOUS | Status: AC
  Filled 2022-05-11: qty 400

## 2022-05-11 MED ORDER — FENTANYL CITRATE (PF) 100 MCG/2ML IJ SOLN
INTRAMUSCULAR | Status: DC | PRN
  Administered 2022-05-11 (×2): 100 ug via EPIDURAL

## 2022-05-11 MED ORDER — TRANEXAMIC ACID-NACL 1000-0.7 MG/100ML-% IV SOLN
INTRAVENOUS | Status: DC | PRN
  Administered 2022-05-11: 1000 mg via INTRAVENOUS

## 2022-05-11 MED ORDER — OXYCODONE HCL 5 MG/5ML PO SOLN: 5.0000 mg | Freq: Once | ORAL | Status: DC | PRN

## 2022-05-11 MED ORDER — ONDANSETRON HCL 4 MG/2ML IJ SOLN: 4.0000 mg | Freq: Once | INTRAMUSCULAR | Status: DC | PRN

## 2022-05-11 MED ORDER — WITCH HAZEL-GLYCERIN EX PADS: 1.0000 | MEDICATED_PAD | CUTANEOUS | Status: DC | PRN

## 2022-05-11 MED ORDER — PHENYLEPHRINE HCL (PRESSORS) 10 MG/ML IV SOLN
INTRAVENOUS | Status: DC | PRN
  Administered 2022-05-11: 160 ug via INTRAVENOUS

## 2022-05-11 MED ORDER — LIDOCAINE-EPINEPHRINE (PF) 1.5 %-1:200000 IJ SOLN
INTRAMUSCULAR | Status: DC | PRN
  Administered 2022-05-11: 3 mL via EPIDURAL

## 2022-05-11 MED ORDER — SODIUM CHLORIDE 0.9 % IV SOLN
500.0000 mg | INTRAVENOUS | Status: AC
  Administered 2022-05-11: 500 mg via INTRAVENOUS
  Filled 2022-05-11: qty 5

## 2022-05-11 MED ORDER — MISOPROSTOL 25 MCG QUARTER TABLET
25.0000 ug | ORAL_TABLET | ORAL | Status: DC
Start: 2022-05-11 — End: 2022-05-11
  Administered 2022-05-11: 25 ug via VAGINAL
  Filled 2022-05-11: qty 1

## 2022-05-11 MED ORDER — DIBUCAINE (PERIANAL) 1 % EX OINT: 1.0000 | TOPICAL_OINTMENT | CUTANEOUS | Status: DC | PRN

## 2022-05-11 MED ORDER — FENTANYL CITRATE (PF) 100 MCG/2ML IJ SOLN
25.0000 ug | INTRAMUSCULAR | Status: DC | PRN
  Administered 2022-05-11 (×2): 25 ug via INTRAVENOUS

## 2022-05-11 MED ORDER — SODIUM BICARBONATE 8.4 % IV SOLN
INTRAVENOUS | Status: DC | PRN
  Administered 2022-05-11: 6 mL via EPIDURAL

## 2022-05-11 MED ORDER — STERILE WATER FOR IRRIGATION IR SOLN
Status: DC | PRN
  Administered 2022-05-11: 1000 mL

## 2022-05-11 MED ORDER — METHYLERGONOVINE MALEATE 0.2 MG/ML IJ SOLN
INTRAMUSCULAR | Status: DC | PRN
  Administered 2022-05-11: .2 mg via INTRAMUSCULAR

## 2022-05-11 MED ORDER — PRENATAL MULTIVITAMIN CH
1.0000 | ORAL_TABLET | Freq: Every day | ORAL | Status: DC
  Administered 2022-05-12 – 2022-05-14 (×3): 1 via ORAL
  Filled 2022-05-11 (×3): qty 1

## 2022-05-11 MED ORDER — OXYCODONE HCL 5 MG PO TABS: 5.0000 mg | ORAL_TABLET | Freq: Once | ORAL | Status: DC | PRN

## 2022-05-11 MED ORDER — SODIUM CHLORIDE 0.9 % IV SOLN
INTRAVENOUS | Status: AC
  Filled 2022-05-11: qty 5

## 2022-05-11 SURGICAL SUPPLY — 36 items
CHLORAPREP W/TINT 26ML (MISCELLANEOUS) ×2 IMPLANT
CLAMP CORD UMBIL (MISCELLANEOUS) ×1 IMPLANT
CLOTH BEACON ORANGE TIMEOUT ST (SAFETY) ×1 IMPLANT
DRSG OPSITE POSTOP 4X10 (GAUZE/BANDAGES/DRESSINGS) ×1 IMPLANT
ELECT REM PT RETURN 9FT ADLT (ELECTROSURGICAL) ×1
ELECTRODE REM PT RTRN 9FT ADLT (ELECTROSURGICAL) ×1 IMPLANT
EXTRACTOR VACUUM BELL STYLE (SUCTIONS) IMPLANT
GAUZE SPONGE 4X4 12PLY STRL (GAUZE/BANDAGES/DRESSINGS) IMPLANT
GLOVE BIOGEL PI IND STRL 7.0 (GLOVE) ×2 IMPLANT
GLOVE BIOGEL PI INDICATOR 7.0 (GLOVE) ×2
GLOVE ECLIPSE 7.0 STRL STRAW (GLOVE) ×1 IMPLANT
GOWN STRL REUS W/TWL LRG LVL3 (GOWN DISPOSABLE) ×2 IMPLANT
HEMOSTAT ARISTA ABSORB 3G PWDR (HEMOSTASIS) IMPLANT
KIT ABG SYR 3ML LUER SLIP (SYRINGE) ×1 IMPLANT
MAT PREVALON FULL STRYKER (MISCELLANEOUS) IMPLANT
NDL HYPO 25X5/8 SAFETYGLIDE (NEEDLE) ×1 IMPLANT
NEEDLE HYPO 25X5/8 SAFETYGLIDE (NEEDLE) ×1 IMPLANT
NS IRRIG 1000ML POUR BTL (IV SOLUTION) ×1 IMPLANT
PACK C SECTION WH (CUSTOM PROCEDURE TRAY) ×1 IMPLANT
PAD ABD 7.5X8 STRL (GAUZE/BANDAGES/DRESSINGS) IMPLANT
PAD OB MATERNITY 4.3X12.25 (PERSONAL CARE ITEMS) ×1 IMPLANT
RTRCTR C-SECT PINK 25CM LRG (MISCELLANEOUS) ×1 IMPLANT
SUT MNCRL 0 VIOLET CTX 36 (SUTURE) ×2 IMPLANT
SUT MONOCRYL 0 CTX 36 (SUTURE) ×2
SUT PLAIN 0 NONE (SUTURE) IMPLANT
SUT PLAIN 2 0 (SUTURE)
SUT PLAIN 2 0 XLH (SUTURE) IMPLANT
SUT PLAIN ABS 2-0 CT1 27XMFL (SUTURE) IMPLANT
SUT VIC AB 0 CTX 36 (SUTURE) ×1
SUT VIC AB 0 CTX36XBRD ANBCTRL (SUTURE) ×1 IMPLANT
SUT VIC AB 2-0 CT1 27 (SUTURE)
SUT VIC AB 2-0 CT1 TAPERPNT 27 (SUTURE) IMPLANT
SUT VIC AB 4-0 KS 27 (SUTURE) ×1 IMPLANT
TOWEL OR 17X24 6PK STRL BLUE (TOWEL DISPOSABLE) ×1 IMPLANT
TRAY FOLEY W/BAG SLVR 14FR LF (SET/KITS/TRAYS/PACK) IMPLANT
WATER STERILE IRR 1000ML POUR (IV SOLUTION) ×1 IMPLANT

## 2022-05-11 NOTE — Progress Notes (Signed)
Labor Progress Note Kristen Watkins is a 36 y.o. G4P3003 at [redacted]w[redacted]d presented for IOL 2/2 PD and AMA.   S: Feeling intermittent pelvic pressure.   O:  BP (!) 97/48   Pulse 92   Temp 98 F (36.7 C) (Oral)   Resp 18   Ht 5\' 6"  (1.676 m)   Wt 109.1 kg   LMP 07/31/2021   SpO2 99%   BMI 38.82 kg/m  EFM: 140bpm/moderate/+accels, recurrent late decels  CVE: Dilation: 8.5 Effacement (%): 90 Station: -2 Presentation: Vertex Exam by:: Dr. [email or web address]   [name]: 36 y.o. 31 [redacted]w[redacted]d here for IOL 2/2 PD and AMA #Labor: Grossly unchanged. Plan for c/s consented by Dr. [redacted]w[redacted]d and I #Pain: Epidural #FWB: Cat II #GBS positive  EFW 97% extrapolated out to around 11lbs  The risks of surgery were discussed with the patient including but were not limited to: bleeding which may require transfusion or reoperation; infection which may require antibiotics; injury to bowel, bladder, ureters or other surrounding organs; injury to the fetus; need for additional procedures including hysterectomy in the event of a life-threatening hemorrhage; formation of adhesions; placental abnormalities wth subsequent pregnancies; incisional problems; thromboembolic phenomenon and other postoperative/anesthesia complications.   The patient concurred with the proposed plan, giving informed written consent for the procedures. Antibiotics to be ordered. To OR when ready.    Hung Rhinesmith Autry-Lott, DO 12:29 PM

## 2022-05-11 NOTE — Anesthesia Preprocedure Evaluation (Signed)
Anesthesia Evaluation  Patient identified by MRN, date of birth, ID band Patient awake    Reviewed: Allergy & Precautions, Patient's Chart, lab work & pertinent test results  Airway Mallampati: III  TM Distance: >3 FB Neck ROM: Full    Dental no notable dental hx.    Pulmonary neg pulmonary ROS,    Pulmonary exam normal breath sounds clear to auscultation       Cardiovascular negative cardio ROS Normal cardiovascular exam Rhythm:Regular Rate:Normal     Neuro/Psych negative neurological ROS  negative psych ROS   GI/Hepatic negative GI ROS, Neg liver ROS,   Endo/Other  Obesity BMI 39  Renal/GU negative Renal ROS  negative genitourinary   Musculoskeletal negative musculoskeletal ROS (+)   Abdominal (+) + obese,   Peds negative pediatric ROS (+)  Hematology negative hematology ROS (+) Hb 12.3, plt 280   Anesthesia Other Findings   Reproductive/Obstetrics (+) Pregnancy                             Anesthesia Physical Anesthesia Plan  ASA: 2  Anesthesia Plan: Epidural   Post-op Pain Management:    Induction:   PONV Risk Score and Plan: 2  Airway Management Planned: Natural Airway  Additional Equipment: None  Intra-op Plan:   Post-operative Plan:   Informed Consent: I have reviewed the patients History and Physical, chart, labs and discussed the procedure including the risks, benefits and alternatives for the proposed anesthesia with the patient or authorized representative who has indicated his/her understanding and acceptance.       Plan Discussed with:   Anesthesia Plan Comments:         Anesthesia Quick Evaluation

## 2022-05-11 NOTE — Progress Notes (Signed)
LABOR PROGRESS NOTE  Kristen Watkins is a 36 y.o. Q6P6195 at [redacted]w[redacted]d  admitted for IOL for post dates, AMA.  Subjective: Feeling tired  Objective: BP (!) 97/48   Pulse 92   Temp 98 F (36.7 C) (Oral)   Resp 18   Ht 5\' 6"  (1.676 m)   Wt 109.1 kg   LMP 07/31/2021   SpO2 99%   BMI 38.82 kg/m  or  Vitals:   05/11/22 1101 05/11/22 1130 05/11/22 1145 05/11/22 1203  BP: (!) 100/56 100/80 109/64 (!) 97/48  Pulse: 84 (!) 105 87 92  Resp: 18 18 18 18   Temp:      TempSrc:      SpO2: 99% 97% 97% 99%  Weight:      Height:         Dilation: 8.5 Effacement (%): 90 Station: -2 Presentation: Vertex Exam by:: Dr. FHT: baseline rate 160, moderate varibility, rare acel, mix of variable, late, and early decel Toco: q2-3 min  Labs: Lab Results  Component Value Date   WBC 7.0 05/10/2022   HGB 12.3 05/10/2022   HCT 37.1 05/10/2022   MCV 85.7 05/10/2022   PLT 280 05/10/2022    Patient Active Problem List   Diagnosis Date Noted   LGA (large for gestational age) fetus affecting management of mother 05/10/2022   Post term pregnancy over 40 weeks 05/10/2022   Group B Streptococcus carrier, +RV culture, currently pregnant 04/12/2022   Low vitamin D level 04/09/2022   Obesity in pregnancy, antepartum 12/15/2021   Supervision of high risk pregnancy, antepartum 10/14/2021   AMA (advanced maternal age) multigravida 35+ 10/14/2021    Assessment / Plan: 35 y.o. G4P3003 at [redacted]w[redacted]d here for IOL for post dates, AMA.  Labor: patient has currently been about 8 cm since 0800 (close to 5 hours ago) with minimal descent of the fetal head past -1,-2. She also had SROM around 0800 as well. In addition there is a suspicion of fetal macrosomia, with last 31 on 04/13/2022 showing 97% EFW 3659g infant which extrapolates to 9-10 lb range. Patient has previously had three vaginal deliveries, largest infant was 3748 grams. Discussed with patient that given Cat II tracing and lack of progress over the  past almost 5 hours with either dilation or descent, I am concerned about the progress with her labor. Given she is multiparous and the lack of progress, I suspect there is some element of macrosomia or malpositioning that is impeding baby's progress. In addition fetal tracing still shows reserve but is more persistently Cat II. After discussion plan to proceed with cesarean delivery.   The risks of cesarean section discussed with the patient included but were not limited to: bleeding which may require transfusion or reoperation; infection which may require antibiotics; injury to bowel, bladder, ureters or other surrounding organs; injury to the fetus; need for additional procedures including hysterectomy in the event of a life-threatening hemorrhage; placental abnormalities with subsequent pregnancies, incisional problems, thromboembolic phenomenon and other postoperative/anesthesia complications. The patient concurred with the proposed plan, giving informed written consent for the procedure. Patient NPO status waived given urgency of case. Anesthesia and OR aware. Preoperative prophylactic antibiotics and SCDs ordered on call to the OR.    Fetal Wellbeing:  Cat II Pain Control:  epidural GBS: positive, received adequate prophylaxis Anticipated MOD:  cesarean   [place], MD/MPH Attending Family Medicine Physician, [facility] for [facility], [facility]   05/11/2022, 12:41 PM

## 2022-05-11 NOTE — Op Note (Cosign Needed Addendum)
Kristen Watkins PROCEDURE DATE: 05/11/2022  PREOPERATIVE DIAGNOSES: Intrauterine pregnancy at [redacted]w[redacted]d weeks gestation; cephalo-pelvic disproportion, failed induction, failure to progress: arrest of descent, failure to progress: arrest of dilation, and macrosomia  POSTOPERATIVE DIAGNOSES: The same  PROCEDURE: Low Transverse Cesarean Section  SURGEON:  Dr. Merian Capron  ASSISTANT:  Dr. Salvadore Dom. An experienced assistant was required given the standard of surgical care given the complexity of the case.  This assistant was needed for exposure, dissection, suctioning, retraction, instrument exchange, assisting with delivery with administration of fundal pressure, and for overall help during the procedure.  ANESTHESIOLOGY TEAM: Anesthesiologist: Vira Agar, MD CRNA: Elbert Ewings, CRNA  INDICATIONS: Kristen Watkins is a 36 y.o. [phone] at [redacted]w[redacted]d here for cesarean section secondary to the indications listed under preoperative diagnoses; please see preoperative note for further details.  The risks of surgery were discussed with the patient including but were not limited to: bleeding which may require transfusion or reoperation; infection which may require antibiotics; injury to bowel, bladder, ureters or other surrounding organs; injury to the fetus; need for additional procedures including hysterectomy in the event of a life-threatening hemorrhage; formation of adhesions; placental abnormalities wth subsequent pregnancies; incisional problems; thromboembolic phenomenon and other postoperative/anesthesia complications.  The patient concurred with the proposed plan, giving informed written consent for the procedure.    FINDINGS:  Viable female infant in cephalic presentation.  Apgars 9 and 9.  Clear amniotic fluid.  Intact placenta, three vessel cord.  Normal uterus, fallopian tubes and ovaries bilaterally.  ANESTHESIA: Epidural  INTRAVENOUS FLUIDS: 4200 ml   ESTIMATED BLOOD  LOSS: 2780 ml URINE OUTPUT:  1675 ml SPECIMENS: Placenta sent to pathology COMPLICATIONS: PPH s/p pitocin, 2uPRBCs, 1 uFFP  PROCEDURE IN DETAIL:  The patient preoperatively received intravenous antibiotics and had sequential compression devices applied to her lower extremities.  She was then taken to the operating room where the epidural anesthesia was dosed up to surgical level and was found to be adequate. She was then placed in a dorsal supine position with a leftward tilt, and prepped and draped in a sterile manner.  A foley catheter was placed into her bladder and attached to constant gravity.  After an adequate timeout was performed, a Pfannenstiel skin incision was made with scalpel and carried through to the underlying layer of fascia. The fascia was incised in the midline, and this incision was extended bilaterally in a blunt fashion.  The underlying rectus muscles were dissected off the fascia superiorly and inferiorly in a blunt fashion. The rectus muscles were separated in the midline and the peritoneum was entered bluntly. The rectus muscles were separated in the midline and the peritoneum was entered bluntly. The Alexis self-retaining retractor was introduced into the abdominal cavity.  Attention was turned to the lower uterine segment where a low transverse hysterotomy was made with a scalpel and extended bilaterally bluntly.  The infant was initially cephalic with asynclitic lie. Attempted to bring to hysterotomy but in the interim baby turned breech. The infant was then successfully delivered breech, the cord was clamped and cut after one minute, and the infant was handed over to the awaiting neonatology team. Uterine massage was then administered, and the placenta delivered intact with a three-vessel cord. The uterus was then cleared of clots and debris.  The hysterotomy was closed with 0 Vicryl in a running locked fashion. Hysterotomy extended into the lower uterine segment at time of  delivery with increase bleeding. Code hemorrhage called. Pitocin, 2  uPRBCs, and 1 unit of FFP given. Dr. Para March called for repair with several interrupted sutures and an imbricating layer was also placed with 0 Vicryl with good hemostasis. Arista applied.  The pelvis was cleared of all clot and debris. Hemostasis was confirmed on all surfaces.  The retractor was removed.  The peritoneum was closed with a 0 Vicryl running stitch. The fascia was then closed using 0 Vicryl in a running fashion.  The subcutaneous layer was irrigated, reapproximated with 2-0 plain gut interrupted stitches, and the skin was closed with a 4-0 Keith subcuticular stitch. The patient tolerated the procedure well. Sponge, instrument and needle counts were correct x 3.  She was taken to the recovery room in stable condition.   Kristen Jumbo, DO OB Fellow, [facility], Center for [facility] 05/11/2022, 3:38 PM

## 2022-05-11 NOTE — Progress Notes (Signed)
Labor Progress Note Kristen Watkins is a 36 y.o. G4P3003 at [redacted]w[redacted]d presented for IOL postdates, AMA  S:   O:  BP 118/67   Pulse 82   Temp 98.4 F (36.9 C)   Resp 18   Ht 5\' 6"  (1.676 m)   Wt 109.1 kg   LMP 07/31/2021   SpO2 100%   BMI 38.82 kg/m  EFM: 140bpm/Moderate variability/ none accels/ Variable decels  CVE: Dilation: 8.5 Effacement (%): 90 Station: 0, -1 Presentation: Vertex Exam by:: Dr. [email or web address]   [name]: 36 y.o. 31 [redacted]w[redacted]d IOL as above #Labor: Progressing well. SROM. Rotate positions #Pain: Epidural #FWB: CAT 1 #GBS positive   [redacted]w[redacted]d Mercado-Ortiz, DO 7:54 AM

## 2022-05-11 NOTE — Progress Notes (Signed)
LR 156ml/hr was given at 1825. Had to manually start pump due to the computer and pump would not link.

## 2022-05-11 NOTE — Anesthesia Procedure Notes (Signed)
Epidural Patient location during procedure: OB Start time: 05/11/2022 6:27 AM End time: 05/11/2022 6:38 AM  Staffing Anesthesiologist: Lannie Fields, DO Performed: anesthesiologist   Preanesthetic Checklist Completed: patient identified, IV checked, risks and benefits discussed, monitors and equipment checked, pre-op evaluation and timeout performed  Epidural Patient position: sitting Prep: DuraPrep and site prepped and draped Patient monitoring: continuous pulse ox, blood pressure, heart rate and cardiac monitor Approach: midline Location: L3-L4 Injection technique: LOR air  Needle:  Needle type: Tuohy  Needle gauge: 17 G Needle length: 9 cm Needle insertion depth: 7 cm Catheter type: closed end flexible Catheter size: 19 Gauge Catheter at skin depth: 12 cm Test dose: negative  Assessment Sensory level: T8 Events: blood not aspirated, injection not painful, no injection resistance, no paresthesia and negative IV test  Additional Notes Patient identified. Risks/Benefits/Options discussed with patient including but not limited to bleeding, infection, nerve damage, paralysis, failed block, incomplete pain control, headache, blood pressure changes, nausea, vomiting, reactions to medication both or allergic, itching and postpartum back pain. Confirmed with bedside nurse the patient's most recent platelet count. Confirmed with patient that they are not currently taking any anticoagulation, have any bleeding history or any family history of bleeding disorders. Patient expressed understanding and wished to proceed. All questions were answered. Sterile technique was used throughout the entire procedure. Please see nursing notes for vital signs. Test dose was given through epidural catheter and negative prior to continuing to dose epidural or start infusion. Warning signs of high block given to the patient including shortness of breath, tingling/numbness in hands, complete motor  block, or any concerning symptoms with instructions to call for help. Patient was given instructions on fall risk and not to get out of bed. All questions and concerns addressed with instructions to call with any issues or inadequate analgesia.  Reason for block:procedure for pain

## 2022-05-11 NOTE — Progress Notes (Signed)
Labor Progress Note Reesa Gotschall is a 36 y.o. G4P3003 at [redacted]w[redacted]d presented for IOL for postdates and AMA  S:  Patient resting comfortably. Desires slow management overnight until female provider is available   O:  BP 104/72   Pulse 66   Temp 99.6 F (37.6 C) (Oral)   Resp 18   Ht 5\' 6"  (1.676 m)   Wt 109.1 kg   LMP 07/31/2021   BMI 38.82 kg/m   Fetal Tracing:  Baseline: 125 Variability: moderate Accels: 15x15 Decels: none  Toco: 5-10   [name]: 36 y.o. 31 [redacted]w[redacted]d IOL AMA, postdates #Labor: Progressing well. Continue cytotec overnight. Patient declines FB and agreeable when female provider is in house to do delivery #Pain: n/a #FWB: Cat 1 #GBS positive   [redacted]w[redacted]d, CNM 12:33 AM

## 2022-05-11 NOTE — Discharge Summary (Shared)
Postpartum Discharge Summary  Date of Service updated***     Patient Name: Kristen Watkins DOB: [birth year] MRN: [id number]  Date of admission: 05/10/2022 Delivery date:05/11/2022  Delivering provider: Clarnce Flock  Date of discharge: 05/11/2022  Admitting diagnosis: Post term pregnancy over 40 weeks [O48.0] Intrauterine pregnancy: [redacted]w[redacted]d    Secondary diagnosis:  Principal Problem:   Post term pregnancy over 40 weeks  Additional problems: ***    Discharge diagnosis: Term Pregnancy Delivered and PPH                                              Post partum procedures:blood transfusion Augmentation: Pitocin and Cytotec Complications: [id number] Hospital course: Induction of Labor With Cesarean Section   36y.o. yo [id number] admitted to the hospital 05/10/2022 for induction of labor. Patient had a labor course significant for arrested descent, failure to progress, arrest of dilatation. The patient went for cesarean section due to Macrosomia, Arrest of Dilation, Arrest of Descent, and Non-Reassuring FHR. Delivery details are as follows: Membrane Rupture Time/Date: 7:43 AM ,05/11/2022   Delivery Method:C-Section, Low Transverse  Details of operation can be found in separate operative Note.  Patient had an uncomplicated postpartum course. She is ambulating, tolerating a regular diet, passing flatus, and urinating well.  Patient is discharged home in stable condition on 05/11/22.      Newborn Data: Birth date:05/11/2022  Birth time:2:03 PM  Gender:Female  Living status:Living  Apgars:9 ,9  Weight:3960 g                                Magnesium Sulfate received: No BMZ received: No Rhophylac:No MMR:No T-DaP:{Tdap:23962} Flu: N/A Transfusion:Yes  Physical exam  Vitals:   05/11/22 1630 05/11/22 1645 05/11/22 1705 05/11/22 1815  BP: 123/86 128/70 122/70 (!) 115/96  Pulse: 91 90 79 79  Resp: _0 Temp:   98.3 F (36.8 C) 98.3 F (36.8 C)  TempSrc:    Oral Oral  SpO2: 100%  100% 100%  Weight:      Height:       General: {Exam; general:21111117} Lochia: {Desc; appropriate/inappropriate:30686::"appropriate"} Uterine Fundus: {Desc; firm/soft:30687} Incision: {Exam; incision:21111123} DVT Evaluation: {Exam; dvGYF:[id number]}abs: Lab Results  Component Value Date   WBC 12.0 (H) 05/11/2022   HGB 9.4 (L) 05/11/2022   HCT 28.9 (L) 05/11/2022   MCV 88.4 05/11/2022   PLT 210 05/11/2022      Latest Ref Rng & Units 05/22/2021   11:25 AM  CMP  Glucose 70 - 99 mg/dL 79   BUN 6 - 20 mg/dL 10   Creatinine 0.44 - 1.00 mg/dL 0.44   Sodium 135 - 145 mmol/L 137   Potassium 3.5 - 5.1 mmol/L 3.9   Chloride 98 - 111 mmol/L 109   CO2 22 - 32 mmol/L 22   Calcium 8.9 - 10.3 mg/dL 8.0    Edinburgh Score:    05/11/2022    6:15 PM  Edinburgh Postnatal Depression Scale Screening Tool  I have been able to laugh and see the funny side of things. 0  I have looked forward with enjoyment to things. 0  I have blamed myself unnecessarily when things went wrong. 0  I have been anxious or worried for no good reason. 0  I have felt scared or panicky for no good reason. 0  Things have been getting on top of me. 0  I have been so unhappy that I have had difficulty sleeping. 2  I have felt sad or miserable. 0  I have been so unhappy that I have been crying. 0  The thought of harming myself has occurred to me. 0  Edinburgh Postnatal Depression Scale Total 2     After visit meds:  Allergies as of 05/11/2022   No Known Allergies   Med Rec must be completed prior to using this [facility]***        Discharge home in stable condition Infant Feeding: Bottle and Breast Infant Disposition:home with mother Discharge instruction: per After Visit Summary and Postpartum booklet. Activity: Advance as tolerated. Pelvic rest for 6 weeks.  Diet: routine diet Future Appointments:No future appointments. Follow up Visit:  Message sent to [facility] 8/21 by Naaman Plummer  Autry-Lott  Please schedule this patient for a In person postpartum visit in 4 weeks with the following provider: MD. Additional Postpartum F/U:Incision check 1 week  Low risk pregnancy complicated by:  Increase estimated fetal weight Delivery mode:  C-Section, Low Transverse  Anticipated Birth Control:  Nexplanon   05/11/2022 Simone Autry-Lott, DO

## 2022-05-11 NOTE — Lactation Note (Signed)
This note was copied from a baby's chart. Lactation Consultation Note  Patient Name: Kristen Watkins Date: 05/11/2022   Age:36 hours Birth parent declined LC services at this time.   Maternal Data    Feeding    LATCH Score                    Lactation Tools Discussed/Used    Interventions    Discharge    Consult Status      Kristen Lahaie  Watkins 05/11/2022, 10:45 PM

## 2022-05-11 NOTE — Progress Notes (Signed)
Labor Progress Note Kristen Watkins is a 36 y.o. G4P3003 at [redacted]w[redacted]d presented for IOL 2/2 PD and AMA.   S: Feeling intermittent pelvic pressure.   O:  BP 113/60   Pulse 89   Temp 98.4 F (36.9 C)   Resp 16   Ht 5\' 6"  (1.676 m)   Wt 109.1 kg   LMP 07/31/2021   SpO2 100%   BMI 38.82 kg/m  EFM: 140bpm/moderate/+accels, no decels  CVE: Dilation: 8 Effacement (%): 90 Station: -1 Presentation: Vertex Exam by:: Dr. [email or web address]   [name]: 36 y.o. 31 110w4d here for IOL 2/2 PD and AMA #Labor: Unchanged from last cervical exam which was an hour ago. Start pitocin.  #Pain: Epidural #FWB: Cat I #GBS positive  EFW 97%  [name], DO 9:03 AM

## 2022-05-12 ENCOUNTER — Encounter (HOSPITAL_COMMUNITY): Payer: Self-pay | Admitting: Family Medicine

## 2022-05-12 LAB — POCT I-STAT EG7
Acid-base deficit: 7 mmol/L — ABNORMAL HIGH (ref 0.0–2.0)
Bicarbonate: 17.4 mmol/L — ABNORMAL LOW (ref 20.0–28.0)
Calcium, Ion: 1.15 mmol/L (ref 1.15–1.40)
HCT: 25 % — ABNORMAL LOW (ref 36.0–46.0)
Hemoglobin: 8.5 g/dL — ABNORMAL LOW (ref 12.0–15.0)
O2 Saturation: 99 %
Potassium: 3.8 mmol/L (ref 3.5–5.1)
Sodium: 135 mmol/L (ref 135–145)
TCO2: 18 mmol/L — ABNORMAL LOW (ref 22–32)
pCO2, Ven: 28 mmHg — ABNORMAL LOW (ref 44–60)
pH, Ven: 7.4 (ref 7.25–7.43)
pO2, Ven: 118 mmHg — ABNORMAL HIGH (ref 32–45)

## 2022-05-12 LAB — TYPE AND SCREEN
ABO/RH(D): O POS
Antibody Screen: NEGATIVE
Unit division: 0
Unit division: 0

## 2022-05-12 LAB — CBC
HCT: 30.9 % — ABNORMAL LOW (ref 36.0–46.0)
Hemoglobin: 10.4 g/dL — ABNORMAL LOW (ref 12.0–15.0)
MCH: 29 pg (ref 26.0–34.0)
MCHC: 33.7 g/dL (ref 30.0–36.0)
MCV: 86.1 fL (ref 80.0–100.0)
Platelets: 217 10*3/uL (ref 150–400)
RBC: 3.59 MIL/uL — ABNORMAL LOW (ref 3.87–5.11)
RDW: 15 % (ref 11.5–15.5)
WBC: 16.5 10*3/uL — ABNORMAL HIGH (ref 4.0–10.5)
nRBC: 0 % (ref 0.0–0.2)

## 2022-05-12 LAB — BPAM RBC
Blood Product Expiration Date: 202309262359
Blood Product Expiration Date: 202309272359
ISSUE DATE / TIME: 202308211432
ISSUE DATE / TIME: 202308211432
Unit Type and Rh: 5100
Unit Type and Rh: 5100

## 2022-05-12 LAB — PREPARE FRESH FROZEN PLASMA

## 2022-05-12 LAB — BPAM FFP
Blood Product Expiration Date: 202308262359
ISSUE DATE / TIME: 202308211459
Unit Type and Rh: 5100

## 2022-05-12 MED ORDER — FERROUS SULFATE 325 (65 FE) MG PO TABS
325.0000 mg | ORAL_TABLET | ORAL | Status: DC
  Administered 2022-05-12 – 2022-05-14 (×2): 325 mg via ORAL
  Filled 2022-05-12 (×2): qty 1

## 2022-05-12 MED ORDER — GABAPENTIN 100 MG PO CAPS
200.0000 mg | ORAL_CAPSULE | Freq: Every day | ORAL | Status: DC
  Administered 2022-05-12 – 2022-05-13 (×2): 200 mg via ORAL
  Filled 2022-05-12 (×2): qty 2

## 2022-05-12 MED ORDER — OXYCODONE HCL 5 MG PO TABS
10.0000 mg | ORAL_TABLET | ORAL | Status: DC | PRN
  Administered 2022-05-12 – 2022-05-14 (×5): 10 mg via ORAL
  Filled 2022-05-12 (×5): qty 2

## 2022-05-12 NOTE — Anesthesia Postprocedure Evaluation (Signed)
Anesthesia Post Note  Patient: Kristen Watkins  Procedure(s) Performed: CESAREAN SECTION     Patient location during evaluation: Mother Baby Anesthesia Type: Epidural Level of consciousness: awake and alert Pain management: pain level controlled Vital Signs Assessment: post-procedure vital signs reviewed and stable Respiratory status: spontaneous breathing, nonlabored ventilation and respiratory function stable Cardiovascular status: stable Postop Assessment: no headache, no backache and epidural receding Anesthetic complications: no   No notable events documented.  Last Vitals:  Vitals:   05/12/22 0207 05/12/22 0600  BP: 123/67   Pulse: 86   Resp: 17   Temp: 36.9 C   SpO2:  98%    Last Pain:  Vitals:   05/12/22 0600  TempSrc:   PainSc: 4                  [name]

## 2022-05-12 NOTE — Progress Notes (Signed)
POSTPARTUM PROGRESS NOTE  POD #1  Subjective:  Kristen Watkins is a 36 y.o. N0U7253 s/p pLTCS at [redacted]w[redacted]d. No acute events overnight. She reports she is having pain in her abdomen and feels distended. Has moved minimally 2/2 pain. She denies any problems with ambulating, voiding or po intake. Denies nausea or vomiting. She has not passed flatus. Pain is poorly controlled.  Lochia is adequate.  Objective: Blood pressure 123/67, pulse 86, temperature 98.5 F (36.9 C), temperature source Oral, resp. rate 17, height 5\' 6"  (1.676 m), weight 109.1 kg, last menstrual period 07/31/2021, SpO2 98 %, unknown if currently breastfeeding.  Physical Exam:  General: alert, cooperative and no distress Chest: no respiratory distress Heart: regular rate, distal pulses intact Uterine Fundus: firm, appropriately tender DVT Evaluation: No calf swelling or tenderness Extremities: mild edema Skin: warm, dry; incision clean/dry/intact w/ honeycomb dressing in place  Recent Labs    05/11/22 1506 05/12/22 0510  HGB 9.4* 10.4*  HCT 28.9* 30.9*    Assessment/Plan: Kristen Watkins is a 36 y.o. 31 s/p pLTCS at [redacted]w[redacted]d for NRFHT.  POD#1 - Doing welll; pain not well controlled. H/H appropriate  Routine postpartum care  OOB, ambulated  Lovenox for VTE prophylaxis  Increase pain med dose, add gabapentin Anemia: asymptomatic, s/p 2 u PRBC, 1 u FFP  Start po ferrous sulfate every other day Contraception: Nexplanon Feeding: Breast  Dispo: Plan for discharge tomorrow.   LOS: 2 days   [redacted]w[redacted]d, DO OB Fellow  05/12/2022, 7:27 AM

## 2022-05-12 NOTE — Progress Notes (Signed)
Pt requests a shower. I encouraged pt to wait until she has ambulated several times without dizziness.  Pt states she is not dizzy and is going to shower now with her mother's assistance.  Shower chair is provided and safety is reviewed with patient.

## 2022-05-12 NOTE — Transfer of Care (Signed)
Immediate Anesthesia Transfer of Care Note  Patient: Kristen Watkins  Procedure(s) Performed: CESAREAN SECTION  Patient Location: PACU  Anesthesia Type:Spinal  Level of Consciousness: awake, alert  and oriented  Airway & Oxygen Therapy: Patient Spontanous Breathing  Post-op Assessment: Report given to RN and Post -op Vital signs reviewed and stable  Post vital signs: Reviewed and stable  Last Vitals:  Vitals Value Taken Time  BP 123/67 05/12/22 0207  Temp 36.9 C 05/12/22 0847  Pulse 76 05/12/22 0847  Resp 17 05/12/22 0207  SpO2 100 % 05/12/22 0847    Last Pain:  Vitals:   05/12/22 0940  TempSrc:   PainSc: 3       Patients Stated Pain Goal: 3 (05/12/22 0940)  Complications: No notable events documented.

## 2022-05-13 ENCOUNTER — Inpatient Hospital Stay (HOSPITAL_COMMUNITY): Payer: Medicaid Other

## 2022-05-13 LAB — CBC
HCT: 32.2 % — ABNORMAL LOW (ref 36.0–46.0)
Hemoglobin: 10.5 g/dL — ABNORMAL LOW (ref 12.0–15.0)
MCH: 28.1 pg (ref 26.0–34.0)
MCHC: 32.6 g/dL (ref 30.0–36.0)
MCV: 86.1 fL (ref 80.0–100.0)
Platelets: 267 10*3/uL (ref 150–400)
RBC: 3.74 MIL/uL — ABNORMAL LOW (ref 3.87–5.11)
RDW: 15.2 % (ref 11.5–15.5)
WBC: 20.4 10*3/uL — ABNORMAL HIGH (ref 4.0–10.5)
nRBC: 0 % (ref 0.0–0.2)

## 2022-05-13 MED ORDER — IBUPROFEN 600 MG PO TABS
600.0000 mg | ORAL_TABLET | Freq: Four times a day (QID) | ORAL | Status: DC
  Administered 2022-05-13 – 2022-05-14 (×4): 600 mg via ORAL
  Filled 2022-05-13 (×4): qty 1

## 2022-05-13 MED ORDER — IBUPROFEN 600 MG PO TABS
600.0000 mg | ORAL_TABLET | Freq: Three times a day (TID) | ORAL | Status: DC
Start: 2022-05-13 — End: 2022-05-13
  Administered 2022-05-13: 600 mg via ORAL
  Filled 2022-05-13: qty 1

## 2022-05-13 NOTE — Progress Notes (Signed)
Consulted neurology who plans to come to see the patient. In the meantime will order MRI of Lumbar spine w/o contrast per neurology.   Lavonda Jumbo, DO OB Fellow, Faculty [facility], Center for [facility] 05/13/2022, 6:08 PM

## 2022-05-13 NOTE — Progress Notes (Signed)
Patient declines wanting any medication for MRI. All jewelry removed. MRI will call for transportation. Royston Cowper

## 2022-05-13 NOTE — Progress Notes (Signed)
POSTPARTUM PROGRESS NOTE  POD #1  Subjective:  Kristen Watkins is a 36 y.o. [id number] s/p pLTCS at [redacted]w[redacted]d.  No acute events overnight. She continues to have significant pain, despite current medication regimen. Also having trouble with ambulation. She describes weakness in her left lower limb, and feels like she is having to drag it during ambulation.  Objective: Blood pressure 113/72, pulse (!) 122, temperature 99 F (37.2 C), temperature source Oral, resp. rate 18, height 5\' 6"  (1.676 m), weight 109.1 kg, last menstrual period 07/31/2021, SpO2 98 %, unknown if currently breastfeeding.  Physical Exam:  General: alert, not in distress Chest: no increased work of breathing. Heart: regular rate. Normal distal pulses Uterine Fundus: firm, slightly tender, surgical site dressing clean and dry DVT Evaluation: no calf swelling or tenderness. Extremities: weakness noted in both lower extremity; strength 4/5 RLE, 3/5 LLE Gait: needs 2 person support, due to b/l LE weakness. Skin: warm, dry;  Recent Labs    05/12/22 0510 05/13/22 0726  HGB 10.4* 10.5*  HCT 30.9* 32.2*    Assessment/Plan: Kristen Watkins is a 36 y.o. 31 s/p pLTCS at [redacted]w[redacted]d for NRFHT.  POD#2 - Having more lower extremity weakness than expected at this point, suspect related to spinal, and possibly slow healing given significant blood loss.  Lovenox for VTE prophylaxis  - add scheduled ibuprofen to pain regimen, has tylenol, oxycodone and gabapentin in place - consult anesthesia to re-evaluate  Anemia: asymptomatic, s/p 2 u PRBC, 1 u FFP - CBC am stable at 10.5 - continue po ferrous sulfate every other day  Contraception: Nexplanon Feeding: Breast  Dispo: May need to hold off discharge today, given significant lower extremity weakness.   LOS: 3 days   [redacted]w[redacted]d, MD OB Fellow  05/13/2022, 8:26 AM

## 2022-05-13 NOTE — Progress Notes (Signed)
Consulted to evaluate symptoms of post partum nerve injury.  Pt was admitted on 8/21 for IOL with epidural for analgesia.  There was failure to progress and she was taken to the OR for C/S that evening.  Post-operatively, she states that her epidural wore off and she was able to ambulate normally.  Starting last night (8/22) she says her left leg became increasingly weak.  She also describes numbness on the lateral aspect of her left thigh in the distribution of the lateral femoral cutaneous nerve.  Upon exam today she has noticeable weakness in her LLE.  Hip flexion appears to be the greatest weakenss.  She ambulates with her LLE dragging along the floor and tends to swing her left leg out during her gait.  She has mild tenderness to palpation in her lower back near the location of her epidural catheter insertion site.  Currently there is no epidural catheter in place and the skin is clean without swelling or erythema.  Of note, she has normal bowel and bladder function.  The RLE has normal strength and function.    The findings were discussed with her primary team.  There may be added benefit from a neurology consult.

## 2022-05-13 NOTE — Progress Notes (Signed)
PM check for reported Left LE numbness and weakness. The patient continues to endorse this and has decreased sensation to the outer left lower abdomen and left thigh. She is able to bear weight but drags her left foot when walking. Otherwise she is grossly intact with all other neurological finding. She is urinating and passing flatus. Discussed with anesthesia who plans to come to evaluate the patient.   Lavonda Jumbo, DO OB Fellow, Faculty [facility], Center for [facility] 05/13/2022, 6:06 PM

## 2022-05-14 ENCOUNTER — Inpatient Hospital Stay (HOSPITAL_COMMUNITY): Payer: Medicaid Other

## 2022-05-14 ENCOUNTER — Other Ambulatory Visit (HOSPITAL_COMMUNITY): Payer: Self-pay

## 2022-05-14 DIAGNOSIS — O480 Post-term pregnancy: Principal | ICD-10-CM

## 2022-05-14 DIAGNOSIS — Z30017 Encounter for initial prescription of implantable subdermal contraceptive: Secondary | ICD-10-CM

## 2022-05-14 LAB — SURGICAL PATHOLOGY

## 2022-05-14 MED ORDER — FERROUS SULFATE 325 (65 FE) MG PO TABS
325.0000 mg | ORAL_TABLET | ORAL | 0 refills | Status: DC
  Filled 2022-05-14: qty 15, 30d supply, fill #0

## 2022-05-14 MED ORDER — LIDOCAINE HCL 1 % IJ SOLN
0.0000 mL | Freq: Once | INTRAMUSCULAR | Status: DC | PRN
  Filled 2022-05-14: qty 20

## 2022-05-14 MED ORDER — LIDOCAINE 4 % EX CREA: TOPICAL_CREAM | Freq: Four times a day (QID) | CUTANEOUS | Status: DC | PRN

## 2022-05-14 MED ORDER — ETONOGESTREL 68 MG ~~LOC~~ IMPL
68.0000 mg | DRUG_IMPLANT | Freq: Once | SUBCUTANEOUS | Status: AC
  Administered 2022-05-14: 68 mg via SUBCUTANEOUS
  Filled 2022-05-14: qty 1

## 2022-05-14 MED ORDER — ACETAMINOPHEN 500 MG PO TABS
1000.0000 mg | ORAL_TABLET | Freq: Four times a day (QID) | ORAL | 0 refills | Status: AC
  Filled 2022-05-14: qty 30, 4d supply, fill #0

## 2022-05-14 MED ORDER — SENNOSIDES-DOCUSATE SODIUM 8.6-50 MG PO TABS
2.0000 | ORAL_TABLET | Freq: Two times a day (BID) | ORAL | 0 refills | Status: DC
Start: 2022-05-14 — End: 2022-06-02
  Filled 2022-05-14: qty 120, 30d supply, fill #0

## 2022-05-14 MED ORDER — OXYCODONE HCL 10 MG PO TABS
10.0000 mg | ORAL_TABLET | ORAL | 0 refills | Status: DC | PRN
Start: 2022-05-14 — End: 2022-06-02
  Filled 2022-05-14: qty 30, 5d supply, fill #0

## 2022-05-14 MED ORDER — IBUPROFEN 600 MG PO TABS
600.0000 mg | ORAL_TABLET | Freq: Four times a day (QID) | ORAL | 0 refills | Status: DC
  Filled 2022-05-14: qty 30, 8d supply, fill #0

## 2022-05-14 NOTE — Procedures (Signed)
Postpartum Nexplanon Insertion Procedure Note  Patient was identified. Informed consent was signed, signed copy in chart. A time-out was performed.    The insertion site was identified 8-10 cm (3-4 inches) from the medial epicondyle of the humerus and 3-5 cm (1.25-2 inches) posterior to (below) the sulcus (groove) between the biceps and triceps muscles of the patient's left arm and marked. The site was prepped and draped in the usual sterile fashion. Pt was prepped with alcohol swab and then injected with 5 cc of 1% lidocaine. The site was prepped with betadine. Nexplanon removed form packaging,  Device confirmed in needle, then inserted full length of needle and withdrawn per handbook instructions. Provider and patient verified presence of the implant in the woman's arm by palpation. Pt insertion site was covered with steristrips/adhesive bandage and pressure bandage. There was minimal blood loss. Patient tolerated procedure well.  Patient was given post procedure instructions and Nexplanon user card with expiration date. Condoms were recommended for STI prevention. Patient was asked to keep the pressure dressing on for 24 hours to minimize bruising and keep the adhesive bandage on for 3-5 days. The patient verbalized understanding of the plan of care and agrees.   Lot # [id number] Expiration Date: 01/19/2024  Sheppard Evens MD MPH OB Fellow, Faculty Practice

## 2022-05-14 NOTE — Consult Note (Signed)
Neurology Consultation Reason for Consult: Leg weakness and numbness  Requesting Physician: Merian Capron  CC: Difficulty walking   History is obtained from: Patient and chart review  HPI: Kristen Watkins is a 36 y.o. female s/p C-section on 8/21 (now G4004), with a past medical history significant for obesity (BMI 38.82).  She reports that she began to notice some difficulty using her left leg on the 22nd.  She is having numbness and pain in the distribution of the left lateral cutaneous nerve.  This pain affects her ambulation, as it is exacerbated by certain movements, particularly lifting the leg or inverting the foot.  She reports that the symptoms have been stable since onset.  She denies any other neurologic or systemic symptoms at this time   ROS: All other review of systems was negative except as noted in the HPI.   Past Medical History:  Diagnosis Date   Breast lump in female 06/28/2014   Biopsy done at radiology-->benign lymph node Unchanged 01-25-18   UTI (lower urinary tract infection) 03/01/2014   Serratia and Enterococcus    Past Surgical History:  Procedure Laterality Date   CESAREAN SECTION N/A 05/11/2022   Procedure: CESAREAN SECTION;  Surgeon: Venora Maples, MD;  Location: [facility] LD ORS;  Service: Obstetrics;  Laterality: N/A;   NO PAST SURGERIES       Family History  Problem Relation Age of Onset   Alcohol abuse Neg Hx    Arthritis Neg Hx    Asthma Neg Hx    Birth defects Neg Hx    Cancer Neg Hx    COPD Neg Hx    Depression Neg Hx    Diabetes Neg Hx    Drug abuse Neg Hx    Early death Neg Hx    Heart disease Neg Hx    Hearing loss Neg Hx    Hyperlipidemia Neg Hx    Hypertension Neg Hx    Kidney disease Neg Hx    Learning disabilities Neg Hx    Mental illness Neg Hx    Mental retardation Neg Hx    Miscarriages / Stillbirths Neg Hx    Stroke Neg Hx    Vision loss Neg Hx    Varicose Veins Neg Hx     Social History:  reports that she has never  smoked. She has never used smokeless tobacco. She reports that she does not drink alcohol and does not use drugs.   Exam: Current vital signs: BP 117/65 (BP Location: Right Arm)   Pulse 91   Temp 98.5 F (36.9 C) (Oral)   Resp 18   Ht 5\' 6"  (1.676 m)   Wt 109.1 kg   LMP 07/31/2021   SpO2 100%   Breastfeeding Unknown   BMI 38.82 kg/m  Vital signs in last 24 hours: Temp:  [98 F (36.7 C)-99 F (37.2 C)] 98.5 F (36.9 C) (08/23 2155) Pulse Rate:  [84-122] 91 (08/23 2155) Resp:  [16-18] 18 (08/23 2155) BP: (113-117)/(62-72) 117/65 (08/23 2155) SpO2:  [98 %-100 %] 100 % (08/23 1450)   Physical Exam  Constitutional: Appears well-developed and well-nourished.  Comfortable at rest, but winces with pain with movement of the left leg Psych: Affect appropriate to situation, calm and cooperative Eyes: No scleral injection HENT: No oropharyngeal obstruction.  MSK: no joint deformities.  Cardiovascular: Perfusing extremities well Respiratory: Effort normal, non-labored breathing GI: Soft.  No distension. There is no tenderness.  Skin: Warm dry and intact visible skin.  She does  have significant edema of the bilateral feet  Neuro: Mental Status: Patient is awake, alert, oriented to person, place, month, year, and situation. Patient is able to give a clear and coherent history. No signs of aphasia or neglect Cranial Nerves: II: Visual Fields are full. Pupils are equal, round, and reactive to light.   III,IV, VI: EOMI without ptosis.  V: Facial sensation is symmetric to light touch VII: Facial movement is symmetric.  VIII: hearing is intact to voice X: Uvula elevates symmetrically XI: Shoulder shrug is symmetric. XII: tongue is midline without atrophy or fasciculations.  Motor: Tone is normal. Bulk is normal. 5/5 strength was present in all four extremities, except for pain limitation of the left leg in hip flexion and foot inversion  Sensory: Sensation is reduced in the left  lateral thigh, but intact medially  Deep Tendon Reflexes: 2+ and symmetric in the brachioradialis, patellae, Achilles. Plantars: Toes are mute bilaterally Cerebellar: Finger-nose is intact bilaterally Gait:  Deferred   I have reviewed labs in epic and the results pertinent to this consultation are:   Basic Metabolic Panel: Recent Labs  Lab 05/11/22 1441  NA 135  K 3.8    CBC: Recent Labs  Lab 05/10/22 2012 05/11/22 1441 05/11/22 1506 05/11/22 1507 05/12/22 0510 05/13/22 0726  WBC 7.0  --  12.0*  --  16.5* 20.4*  HGB 12.3 8.5* 9.4*  --  10.4* 10.5*  HCT 37.1 25.0* 28.9*  --  30.9* 32.2*  MCV 85.7  --  88.4  --  86.1 86.1  PLT 280  --  209 210 217 267    Coagulation Studies: Recent Labs    05/11/22 1507  LABPROT 14.4  INR 1.1       I have reviewed the images obtained:  MRI lumbar spine personally reviewed, agree with radiology Minimal central disc protrusion at L4-L5 without spinal canal or neural foraminal stenosis. Otherwise normal MRI of the lumbar spine.  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  Patient confirms that the limitation in her movement is secondary to pain and if her pain was controlled she would be able to move normally; this is due to certain movements exacerbating the compression of the nerve  Recommendations: -MRI lumbar spine obtained on neurology recommendation negative as above -If ibuprofen and Tylenol are insufficient pain control, can try lidocaine cream (ordered) -Wear loose clothing -Weight loss -Outpatient follow-up with neurology if symptoms are not improving or worsening surgical treatment could be considered -Thank you for involving [place] in the care of this patient.  Neurology will be available on an as-needed basis going forward,  please do not hesitate to reach out if any questions or concerns arise  Brooke Dare MD-PhD [facility] [phone] Available 7 PM to 7 AM, outside of these hours please call Neurologist on call as listed on Amion.

## 2022-05-19 ENCOUNTER — Other Ambulatory Visit: Payer: Self-pay

## 2022-05-19 ENCOUNTER — Ambulatory Visit (INDEPENDENT_AMBULATORY_CARE_PROVIDER_SITE_OTHER): Payer: Medicaid Other | Admitting: *Deleted

## 2022-05-19 VITALS — BP 107/68 | HR 77 | Ht 66.0 in | Wt 221.3 lb

## 2022-05-19 DIAGNOSIS — G8918 Other acute postprocedural pain: Secondary | ICD-10-CM

## 2022-05-19 MED ORDER — IBUPROFEN 600 MG PO TABS: 600.0000 mg | ORAL_TABLET | Freq: Four times a day (QID) | ORAL | 0 refills | Status: AC

## 2022-05-19 NOTE — Progress Notes (Signed)
Here for wound check and bp check after c/s 05/11/22 for failed IOL. BP wnl at 107/68.   Incision CDI without redness , edema , or discharge. Wound care instructions given. She request refill of ibuprofen RX, RX refill sent in per protocol. Reminded of postpartum appointment. She voices understanding. Nancy Fetter

## 2022-06-01 ENCOUNTER — Encounter: Payer: Self-pay | Admitting: Obstetrics and Gynecology

## 2022-06-02 ENCOUNTER — Other Ambulatory Visit: Payer: Self-pay

## 2022-06-02 ENCOUNTER — Ambulatory Visit (INDEPENDENT_AMBULATORY_CARE_PROVIDER_SITE_OTHER): Payer: Medicaid Other

## 2022-06-02 ENCOUNTER — Ambulatory Visit: Payer: Self-pay | Admitting: *Deleted

## 2022-06-02 DIAGNOSIS — K668 Other specified disorders of peritoneum: Secondary | ICD-10-CM | POA: Diagnosis not present

## 2022-06-02 NOTE — Telephone Encounter (Signed)
Agree with nursing's position to report to her OB/GYN.

## 2022-06-02 NOTE — Progress Notes (Addendum)
Pt here today for wound check. Pt had C-section on 05/11/2022.  Pt states having discharge that was green and odorous. Denies fever or severe abd pain.   Pt was here for wound check 1 week post delivery on 8/29 that was CDI at that time. Today wound is clean, three small spots across incision with dehiscence. Dr Crissie Reese in room to see wound. Applied silver nitrate to three areas on incision site. Pt tolerated well.   Pt advised to keep clean and dry. Pt verbalized understanding.   Pt has scheduled PP visit on 06/15/2022 with Dr Para March.  Judeth Cornfield, [facility]     ATTENDING ATTESTATION  I have seen and examined this patient and agree with the above documentation in the RN's note except as below.  Very small areas of superficial dehisence <1 cm in size in three different areas. No erythema or induration to suggest infection. I applied silver nitrate with good effect, patient tolerated it well.   Venora Maples, MD/[facility] Center for [employer] (Faculty Practice) 06/02/2022, 12:12 PM

## 2022-06-02 NOTE — Telephone Encounter (Signed)
  Chief Complaint: C-Section wound Symptoms: drainage and has odor Frequency: started 2 days ago Pertinent Negatives: Patient denies fever Disposition: [] ED /[] Urgent Care (no appt availability in office) / [] Appointment(In office/virtual)/ []  [facility]/ [] Home Care/ [] Refused Recommended Disposition /[] [facility]/ [x]  Follow-up with PCP Additional Notes: Pt advised to see her OB today. Stated she sent MyChart message to them and has not heard back. She called office and not open yet. Pt encouraged to call back, if does not get anyone someone will  be on call. Pt states she is going to go to the [facility] office. States if they will not see her she will go to [facility] where she had the surgery.   Reason for Disposition  [1] Incision gaping open AND [2] > 48 hours since wound re-opened AND [3] length of opening > 1/2 inch (12 mm)  Answer Assessment - Initial Assessment Questions 1. SYMPTOM: "What's the main symptom you're concerned about?" (e.g., drainage, incision opening up, pain, redness)     C-section scar opening up. 2. ONSET: "When did drainage  start?"     2 days ago 3. SURGERY: "What surgery did you have?"     05/11/22, C-section 4. DATE of SURGERY: "When was the surgery?"      05/11/22 5. INCISION SITE: "Where is the incision located?"      Lower abd, it is painful 6. REDNESS: "Is there any redness at the incision site?" If Yes, ask: "How wide across is the redness?" (Inches, centimeters)      Not red but warm 7. PAIN: "Is there any pain?" If Yes, ask: "How bad is it?"  (Scale 1-10; or mild, moderate, severe)   - NONE (0): no pain   - MILD (1-3): doesn't interfere with normal activities    - MODERATE (4-7): interferes with normal activities or awakens from sleep    - SEVERE (8-10): excruciating pain, unable to do any normal activities     A little pain 8. BLEEDING: "Is there any bleeding?" If Yes, ask: "How much?" and "Where?"     Not  bleeding 9. DRAINAGE: "Is there any drainage from the incision site?" If Yes, ask: "What color and how much?" (e.g., red, cloudy, pus; drops, teaspoon)     Scant drainage, unsure of color but thick 10. FEVER: "Do you have a fever?" If Yes, ask: "What is your temperature, how was it measured, and when did it start?"       no 11. OTHER SYMPTOMS: "Do you have any other symptoms?" (e.g., dizziness, rash elsewhere on body, shaking chills, weakness)       no  Protocols used: Post-Op Incision Symptoms and Questions-A-AH

## 2022-06-04 ENCOUNTER — Encounter (HOSPITAL_COMMUNITY): Payer: Self-pay | Admitting: Obstetrics & Gynecology

## 2022-06-04 ENCOUNTER — Inpatient Hospital Stay (HOSPITAL_COMMUNITY)
Admission: AD | Admit: 2022-06-04 | Discharge: 2022-06-04 | Disposition: A | Discharge status: Home / Self Care | Payer: Medicaid Other | Attending: Obstetrics & Gynecology | Admitting: Obstetrics & Gynecology

## 2022-06-04 DIAGNOSIS — O900 Disruption of cesarean delivery wound: Secondary | ICD-10-CM | POA: Insufficient documentation

## 2022-06-04 MED ORDER — AMOXICILLIN-POT CLAVULANATE 875-125 MG PO TABS: 1.0000 | ORAL_TABLET | Freq: Two times a day (BID) | ORAL | 0 refills | Status: AC

## 2022-06-04 MED ORDER — SILVER NITRATE-POT NITRATE 75-25 % EX MISC
CUTANEOUS | Status: AC
  Filled 2022-06-04: qty 10

## 2022-06-04 NOTE — MAU Note (Signed)
.  Kristen Watkins is a 36 y.o. at Unknown here in MAU reporting: was  seen in office this week for wound check had 2-3 small spots that were leaking. Silver nitrate applied to areas. Pt stated when she went home she stare having another spot that stared to leak and bleed. Stated pain to incion is worse today but has taken Ibuprofen and It has helped the pain,  Onset of complaint: 2-3 days Pain score: 7 Vitals:   06/04/22 1819  BP: (!) 114/59  Pulse: 75  Resp: 18  Temp: 98.5 F (36.9 C)     FHT:n/a Lab orders placed from triage:

## 2022-06-04 NOTE — MAU Provider Note (Signed)
History     [id number]  Arrival date and time: 06/04/22 1747    Chief Complaint  Patient presents with   Wound Check     HPI Kristen Watkins is a 36 y.o. s/p primary cesarean on 05/11/2022, who presents for incision concern.   Patient well known to me I performed her cesarean Subsequently saw her in clinic on 06/02/2022, at that time had several very small areas of dehisence, silver nitrate was applied with good effect  Today reports she has noticed additional areas of disruption and increased drainage Also notes some bleeding which was not happening before No fever Does endorse some mildly increased pain   --/--/O POS (08/20 2011)  OB History     Gravida  4   Para  4   Term  4   Preterm  0   AB  0   Living  4      SAB  0   IAB  0   Ectopic  0   Multiple  0   Live Births  4           Past Medical History:  Diagnosis Date   Breast lump in female 06/28/2014   Biopsy done at radiology-->benign lymph node Unchanged 01-25-18   UTI (lower urinary tract infection) 03/01/2014   Serratia and Enterococcus     Past Surgical History:  Procedure Laterality Date   CESAREAN SECTION N/A 05/11/2022   Procedure: CESAREAN SECTION;  Surgeon: Venora Maples, MD;  Location: [facility] ORS;  Service: Obstetrics;  Laterality: N/A;   NO PAST SURGERIES      Family History  Problem Relation Age of Onset   Alcohol abuse Neg Hx    Arthritis Neg Hx    Asthma Neg Hx    Birth defects Neg Hx    Cancer Neg Hx    COPD Neg Hx    Depression Neg Hx    Diabetes Neg Hx    Drug abuse Neg Hx    Early death Neg Hx    Heart disease Neg Hx    Hearing loss Neg Hx    Hyperlipidemia Neg Hx    Hypertension Neg Hx    Kidney disease Neg Hx    Learning disabilities Neg Hx    Mental illness Neg Hx    Mental retardation Neg Hx    Miscarriages / Stillbirths Neg Hx    Stroke Neg Hx    Vision loss Neg Hx    Varicose Veins Neg Hx     Social History   Socioeconomic History    Marital status: Married    Spouse name: Not on file   Number of children: Not on file   Years of education: Not on file   Highest education level: Not on file  Occupational History   Not on file  Tobacco Use   Smoking status: Never   Smokeless tobacco: Never  Vaping Use   Vaping Use: Never used  Substance and Sexual Activity   Alcohol use: No   Drug use: No   Sexual activity: Yes    Birth control/protection: None  Other Topics Concern   Not on file  Social History Narrative   Not on file   Social Determinants of Health   Financial Resource Strain: Not on file  Food Insecurity: No Food Insecurity (04/30/2022)   Hunger Vital Sign    Worried About Running Out of Food in the Last Year: Never true    Ran Out of Food  in the Last Year: Never true  Transportation Needs: No Transportation Needs (04/30/2022)   PRAPARE - Administrator, Civil Service (Medical): No    Lack of Transportation (Non-Medical): No  Physical Activity: Not on file  Stress: Not on file  Social Connections: Not on file  Intimate Partner Violence: Not on file    No Known Allergies  No current facility-administered medications on file prior to encounter.   Current Outpatient Medications on File Prior to Encounter  Medication Sig Dispense Refill   ibuprofen (ADVIL) 600 MG tablet Take 1 tablet (600 mg total) by mouth every 6 (six) hours. 30 tablet 0   acetaminophen (TYLENOL) 500 MG tablet Take 2 tablets (1,000 mg total) by mouth every 6 (six) hours. 30 tablet 0   Vitamin D, Ergocalciferol, 50000 units CAPS Take 1 capsule by mouth once a week. 12 capsule 3     ROS Pertinent positives and negative per HPI, all others reviewed and negative  Physical Exam   BP (!) 114/59   Pulse 75   Temp 98.5 F (36.9 C)   Resp 18   Ht 5\' 6"  (1.676 m)   Wt 99.8 kg   LMP  (LMP Unknown)   BMI 35.51 kg/m   Patient Vitals for the past 24 hrs:  BP Temp Pulse Resp Height Weight  06/04/22 1819 (!) 114/59  98.5 F (36.9 C) 75 18 5\' 6"  (1.676 m) 99.8 kg    Physical Exam Vitals reviewed.  Constitutional:      General: She is not in acute distress.    Appearance: She is well-developed. She is not diaphoretic.  Eyes:     General: No scleral icterus. Pulmonary:     Effort: Pulmonary effort is normal. No respiratory distress.  Skin:    General: Skin is warm and dry.          Comments: Two small areas of dehisence on R lateral and midlateral L side of wound. Larger area of dehisence, visually about 0.5 cm deep with small amount of bloody purulent discharge. Very mild induration of the surrounding skin around this larger area of dehisence  Neurological:     Mental Status: She is alert.     Coordination: Coordination normal.      Cervical Exam    Bedside Ultrasound N/a  My interpretation: n/a   Labs No results found for this or any previous visit (from the past 24 hour(s)).  Imaging No results found.  MAU Course  Procedures Lab Orders  No laboratory test(s) ordered today   Meds ordered this encounter  Medications   silver nitrate applicators 75-25 % applicator    06/06/22, Lynnette W: cabinet override   amoxicillin-clavulanate (AUGMENTIN) 875-125 MG tablet    Sig: Take 1 tablet by mouth 2 (two) times daily for 5 days.    Dispense:  10 tablet    Refill:  0   Imaging Orders  No imaging studies ordered today    MDM moderate  Assessment and Plan  #Cesarean wound dehisence A few more areas of dehisence compared to when I saw her two days prior. Areas previously treated with silver nitrate now look great. Offered to repeat silver nitrate treatment vs allow to heal by secondary intention, she preferred to repeat. Silver nitrate applied to areas with good effect, she tolerated procedure well. Given some mildly purulent discharge from one area which I think may just be inflammatory from suture, will give course of Augmentin out of abundance of caution  x5 days. She has follow  up in clinic in the near future.    Dispo: discharged to home in stable condition.   Venora Maples, MD/MPH 06/04/22 7:20 PM  Allergies as of 06/04/2022   No Known Allergies      Medication List     TAKE these medications    Acetaminophen Extra Strength 500 MG Tabs Take 2 tablets (1,000 mg total) by mouth every 6 (six) hours.   amoxicillin-clavulanate 875-125 MG tablet Commonly known as: AUGMENTIN Take 1 tablet by mouth 2 (two) times daily for 5 days.   ibuprofen 600 MG tablet Commonly known as: ADVIL Take 1 tablet (600 mg total) by mouth every 6 (six) hours.   Vitamin D (Ergocalciferol) 50000 units Caps Take 1 capsule by mouth once a week.

## 2022-06-15 ENCOUNTER — Ambulatory Visit: Payer: Medicaid Other | Admitting: Obstetrics and Gynecology

## 2022-06-17 NOTE — Progress Notes (Unsigned)
[facility] Partum Visit Note  Kristen Watkins is a 36 y.o. [id number] female who presents for a postpartum visit. She is 5 weeks postpartum following a primary cesarean section.  I have fully reviewed the prenatal and intrapartum course. The delivery was at 40.4 gestational weeks.  Anesthesia: spinal. Postpartum course has been complicated by skin separation. Baby is doing well. Baby is feeding by both breast and bottle - Similac Advance. Bleeding staining only. Bowel function is normal. Bladder function is normal. Patient is not sexually active. Contraception method is Nexplanon. Postpartum depression screening: negative.   The pregnancy intention screening data noted above was reviewed. Potential methods of contraception were discussed. The patient elected to proceed with No data recorded.   Edinburgh Postnatal Depression Scale - 06/18/22 0934       Edinburgh Postnatal Depression Scale:  In the Past 7 Days   I have been able to laugh and see the funny side of things. 0    I have looked forward with enjoyment to things. 0    I have blamed myself unnecessarily when things went wrong. 0    I have been anxious or worried for no good reason. 0    I have felt scared or panicky for no good reason. 0    Things have been getting on top of me. 0    I have been so unhappy that I have had difficulty sleeping. 0    I have felt sad or miserable. 0    I have been so unhappy that I have been crying. 0    The thought of harming myself has occurred to me. 0    Edinburgh Postnatal Depression Scale Total 0             Health Maintenance Due  Topic Date Due   COVID-19 Vaccine (1) Never done       Review of Systems Pertinent items noted in HPI and remainder of comprehensive ROS otherwise negative.  Objective:  BP 105/70   Pulse 69   Wt 219 lb 11.2 oz (99.7 kg)   LMP  (LMP Unknown)   Breastfeeding Yes   BMI 35.46 kg/m    General:  alert, cooperative, and no distress   Breasts:  normal  Lungs:  clear to auscultation bilaterally  Heart:  regular rate and rhythm  Abdomen: soft, non-tender; bowel sounds normal; no masses,  no organomegaly   Wound Healed well. 3 0.5 mm areas of skin separation treated with silver nitrate  GU exam:  normal       Assessment:    There are no diagnoses linked to this encounter.  Normal postpartum exam.   Plan:   Essential components of care per ACOG recommendations:  1.  Mood and well being: Patient with negative depression screening today. Reviewed local resources for support.  - Patient tobacco use? No.   - hx of drug use? No.    2. Infant care and feeding:  -Patient currently breastmilk feeding? Yes. Reviewed importance of draining breast regularly to support lactation.  -Social determinants of health (SDOH) reviewed in EPIC. No concerns  3. Sexuality, contraception and birth spacing - Patient does not want a pregnancy in the next year.  Desired family size is 4 children.  - Reviewed reproductive life planning. Reviewed contraceptive methods based on pt preferences and effectiveness.  Patient desired Hormonal Implant placed postpartum.   - Discussed birth spacing of 18 months  4. Sleep and fatigue -Encouraged family/partner/community support of 4 hrs  of uninterrupted sleep to help with mood and fatigue  5. Physical Recovery  - Discussed patients delivery and complications. She describes her labor as good. - Patient had a C-section.  Patient expressed understanding - Patient has urinary incontinence? No. - Patient is safe to resume physical and sexual activity  6.  Health Maintenance - HM due items addressed Yes - Last pap smear  Diagnosis  Date Value Ref Range Status  01/25/2018   Final   NEGATIVE FOR INTRAEPITHELIAL LESIONS OR MALIGNANCY.   Pap smear done at today's visit.  -Breast Cancer screening indicated? No.   7. Chronic Disease/Pregnancy Condition follow up: None  - PCP follow up  Catalina Antigua, MD [facility]  Healthcare, [facility]

## 2022-06-18 ENCOUNTER — Ambulatory Visit (INDEPENDENT_AMBULATORY_CARE_PROVIDER_SITE_OTHER): Payer: Medicaid Other | Admitting: Obstetrics and Gynecology

## 2022-06-18 ENCOUNTER — Ambulatory Visit: Payer: Medicaid Other | Admitting: Obstetrics and Gynecology

## 2022-06-18 ENCOUNTER — Encounter: Payer: Self-pay | Admitting: Obstetrics and Gynecology

## 2022-06-18 ENCOUNTER — Other Ambulatory Visit (HOSPITAL_COMMUNITY)
Admission: RE | Admit: 2022-06-18 | Discharge: 2022-06-18 | Disposition: A | Discharge status: Home / Self Care | Payer: Medicaid Other | Source: Ambulatory Visit | Attending: Obstetrics and Gynecology | Admitting: Obstetrics and Gynecology

## 2022-06-18 ENCOUNTER — Other Ambulatory Visit: Payer: Self-pay

## 2022-06-18 MED ORDER — PRENATAL 27-1 MG PO TABS: 1.0000 | ORAL_TABLET | Freq: Every day | ORAL | 3 refills | Status: DC

## 2022-06-22 ENCOUNTER — Encounter: Payer: Self-pay | Admitting: Family Medicine

## 2022-06-24 LAB — CYTOLOGY - PAP
Comment: NEGATIVE
Diagnosis: NEGATIVE
High risk HPV: NEGATIVE

## 2022-06-26 ENCOUNTER — Other Ambulatory Visit: Payer: Self-pay

## 2022-07-20 ENCOUNTER — Other Ambulatory Visit: Payer: Self-pay | Admitting: Family Medicine

## 2022-11-11 ENCOUNTER — Encounter: Payer: Self-pay | Admitting: Family Medicine

## 2022-11-30 ENCOUNTER — Ambulatory Visit: Payer: Medicaid Other | Admitting: Physician Assistant

## 2022-12-03 ENCOUNTER — Ambulatory Visit: Payer: Medicaid Other | Attending: Physician Assistant | Admitting: Physician Assistant

## 2022-12-03 ENCOUNTER — Encounter: Payer: Self-pay | Admitting: Physician Assistant

## 2022-12-03 VITALS — BP 101/68 | HR 68 | Wt 208.4 lb

## 2022-12-03 DIAGNOSIS — M222X1 Patellofemoral disorders, right knee: Secondary | ICD-10-CM | POA: Diagnosis not present

## 2022-12-03 DIAGNOSIS — E559 Vitamin D deficiency, unspecified: Secondary | ICD-10-CM | POA: Diagnosis not present

## 2022-12-03 DIAGNOSIS — M222X2 Patellofemoral disorders, left knee: Secondary | ICD-10-CM | POA: Diagnosis not present

## 2022-12-03 DIAGNOSIS — R5383 Other fatigue: Secondary | ICD-10-CM

## 2022-12-03 DIAGNOSIS — L659 Nonscarring hair loss, unspecified: Secondary | ICD-10-CM

## 2022-12-03 MED ORDER — PRENATAL 27-1 MG PO TABS: 1.0000 | ORAL_TABLET | Freq: Every day | ORAL | 3 refills | Status: DC

## 2022-12-03 NOTE — Patient Instructions (Signed)
Knee Exercises Ask your health care provider which exercises are safe for you. Do exercises exactly as told by your health care provider and adjust them as directed. It is normal to feel mild stretching, pulling, tightness, or discomfort as you do these exercises. Stop right away if you feel sudden pain or your pain gets worse. Do not begin these exercises until told by your health care provider. Stretching and range-of-motion exercises These exercises warm up your muscles and joints and improve the movement and flexibility of your knee. These exercises also help to relieve pain and swelling. Knee extension, prone  Lie on your abdomen (prone position) on a bed. Place your left / right knee just beyond the edge of the surface so your knee is not on the bed. You can put a towel under your left / right thigh just above your kneecap for comfort. Relax your leg muscles and allow gravity to straighten your knee (extension). You should feel a stretch behind your left / right knee. Hold this position for __________ seconds. Scoot up so your knee is supported between repetitions. Repeat __________ times. Complete this exercise __________ times a day. Knee flexion, active  Lie on your back with both legs straight. If this causes back discomfort, bend your left / right knee so your foot is flat on the floor. Slowly slide your left / right heel back toward your buttocks. Stop when you feel a gentle stretch in the front of your knee or thigh (flexion). Hold this position for __________ seconds. Slowly slide your left / right heel back to the starting position. Repeat __________ times. Complete this exercise __________ times a day. Quadriceps stretch, prone  Lie on your abdomen on a firm surface, such as a bed or padded floor. Bend your left / right knee and hold your ankle. If you cannot reach your ankle or pant leg, loop a belt around your foot and grab the belt instead. Gently pull your heel toward your  buttocks. Your knee should not slide out to the side. You should feel a stretch in the front of your thigh and knee (quadriceps). Hold this position for __________ seconds. Repeat __________ times. Complete this exercise __________ times a day. Hamstring, supine  Lie on your back (supine position). Loop a belt or towel over the ball of your left / right foot. The ball of your foot is on the walking surface, right under your toes. Straighten your left / right knee and slowly pull on the belt to raise your leg until you feel a gentle stretch behind your knee (hamstring). Do not let your knee bend while you do this. Keep your other leg flat on the floor. Hold this position for __________ seconds. Repeat __________ times. Complete this exercise __________ times a day. Strengthening exercises These exercises build strength and endurance in your knee. Endurance is the ability to use your muscles for a long time, even after they get tired. Quadriceps, isometric This exercise strengthens the muscles in front of your thigh (quadriceps) without moving your knee joint (isometric). Lie on your back with your left / right leg extended and your other knee bent. Put a rolled towel or small pillow under your knee if told by your health care provider. Slowly tense the muscles in the front of your left / right thigh. You should see your kneecap slide up toward your hip or see increased dimpling just above the knee. This motion will push the back of the knee toward the floor.  For __________ seconds, hold the muscle as tight as you can without increasing your pain. Relax the muscles slowly and completely. Repeat __________ times. Complete this exercise __________ times a day. Straight leg raises This exercise strengthens the muscles in front of your thigh (quadriceps) and the muscles that move your hips (hip flexors). Lie on your back with your left / right leg extended and your other knee bent. Tense the  muscles in the front of your left / right thigh. You should see your kneecap slide up or see increased dimpling just above the knee. Your thigh may even shake a bit. Keep these muscles tight as you raise your leg 4-6 inches (10-15 cm) off the floor. Do not let your knee bend. Hold this position for __________ seconds. Keep these muscles tense as you lower your leg. Relax your muscles slowly and completely after each repetition. Repeat __________ times. Complete this exercise __________ times a day. Hamstring, isometric  Lie on your back on a firm surface. Bend your left / right knee about __________ degrees. Dig your left / right heel into the surface as if you are trying to pull it toward your buttocks. Tighten the muscles in the back of your thighs (hamstring) to "dig" as hard as you can without increasing any pain. Hold this position for __________ seconds. Release the tension gradually and allow your muscles to relax completely for __________ seconds after each repetition. Repeat __________ times. Complete this exercise __________ times a day. Hamstring curls If told by your health care provider, do this exercise while wearing ankle weights. Begin with __________lb / kg weights. Then increase the weight by 1 lb (0.5 kg) increments. Do not wear ankle weights that are more than __________lb / kg. Lie on your abdomen with your legs straight. Bend your left / right knee as far as you can without feeling pain. Keep your hips flat against the floor. Hold this position for __________ seconds. Slowly lower your leg to the starting position. Repeat __________ times. Complete this exercise __________ times a day. Squats This exercise strengthens the muscles in front of your thigh and knee (quadriceps). Stand in front of a table, with your feet and knees pointing straight ahead. You may rest your hands on the table for balance but not for support. Slowly bend your knees and lower your hips like you  are going to sit in a chair. Keep your weight over your heels, not over your toes. Keep your lower legs upright so they are parallel with the table legs. Do not let your hips go lower than your knees. Do not bend lower than told by your health care provider. If your knee pain increases, do not bend as low. Hold the squat position for __________ seconds. Slowly push with your legs to return to standing. Do not use your hands to pull yourself to standing. Repeat __________ times. Complete this exercise __________ times a day. Wall slides This exercise strengthens the muscles in front of your thigh and knee (quadriceps). Lean your back against a smooth wall or door, and walk your feet out 18-24 inches (46-61 cm) from it. Place your feet hip-width apart. Slowly slide down the wall or door until your knees bend __________ degrees. Keep your knees over your heels, not over your toes. Keep your knees in line with your hips. Hold this position for __________ seconds. Repeat __________ times. Complete this exercise __________ times a day. Straight leg raises, side-lying This exercise strengthens the muscles that rotate  the leg at the hip and move it away from your body (hip abductors). Lie on your side with your left / right leg in the top position. Lie so your head, shoulder, knee, and hip line up. You may bend your bottom knee to help you keep your balance. Roll your hips slightly forward so your hips are stacked directly over each other and your left / right knee is facing forward. Leading with your heel, lift your top leg 4-6 inches (10-15 cm). You should feel the muscles in your outer hip lifting. Do not let your foot drift forward. Do not let your knee roll toward the ceiling. Hold this position for __________ seconds. Slowly return your leg to the starting position. Let your muscles relax completely after each repetition. Repeat __________ times. Complete this exercise __________ times a  day. Straight leg raises, prone This exercise stretches the muscles that move your hips away from the front of the pelvis (hip extensors). Lie on your abdomen on a firm surface. You can put a pillow under your hips if that is more comfortable. Tense the muscles in your buttocks and lift your left / right leg about 4-6 inches (10-15 cm). Keep your knee straight as you lift your leg. Hold this position for __________ seconds. Slowly lower your leg to the starting position. Let your leg relax completely after each repetition. Repeat __________ times. Complete this exercise __________ times a day. This information is not intended to replace advice given to you by your health care provider. Make sure you discuss any questions you have with your health care provider. Document Revised: 05/20/2021 Document Reviewed: 05/20/2021 Elsevier Patient Education  [place]. Patellofemoral Pain Syndrome  Patellofemoral pain syndrome is a condition in which the tissue (cartilage) on the underside of the kneecap (patella) softens or breaks down. This causes pain in the front of the knee. The condition is also called runner's knee or chondromalacia patella. Patellofemoral pain syndrome is most common in young adults who are active in sports. The knee is the largest joint in the body. The patella covers the front of the knee and is attached to muscles above and below the knee. The underside of the patella is covered with a smooth type of cartilage (synovium). The smooth surface helps the patella glide easily when you move your knee. Patellofemoral pain syndrome causes swelling in the joint linings and bone surfaces in the knee. What are the causes? This condition may be caused by: Overuse of the knee. Poor alignment of your knee joints. Weak leg muscles. A direct hit to your kneecap. What increases the risk? You are more likely to develop this condition if: You do a lot of activities that can wear down  your kneecap. These include: Running. Squatting. Climbing stairs. You start a new physical activity or exercise program. You wear shoes that do not fit well. You do not have good leg strength. You are overweight. What are the signs or symptoms? The main symptom of this condition is knee pain. This may feel like a dull, aching pain underneath your patella, in the front of your knee. There may be a popping or cracking sound when you move your knee. Pain may get worse with: Exercise. Climbing stairs. Running. Jumping. Squatting. Kneeling. Sitting for a long time. Moving or pushing on your patella. How is this diagnosed? This condition may be diagnosed based on: Your symptoms and medical history. You may be asked about your recent physical activities and which ones  cause knee pain. A physical exam. This may include: Moving your patella back and forth. Checking your range of knee motion. Having you squat or jump to see if you have pain. Checking the strength of your leg muscles. Imaging tests to confirm the diagnosis. These may include an MRI of your knee. How is this treated? This condition may be treated at home with rest, ice, compression, and elevation (RICE).  Other treatments may include: NSAIDs, such as ibuprofen. Physical therapy to stretch and strengthen your leg muscles. Shoe inserts (orthotics) to take stress off your knee. A knee brace or knee support. Adhesive tapes to the skin. Surgery to remove damaged cartilage or move the patella to a better position. This is rare. Follow these instructions at home: If you have a brace: Wear the brace as told by your health care provider. Remove it only as told by your health care provider. Loosen the brace if your toes tingle, become numb, or turn cold and blue. Keep the brace clean. If the brace is not waterproof: Do not let it get wet. Cover it with a watertight covering when you take a bath or a shower. Managing pain,  stiffness, and swelling  If directed, put ice on the painful area. To do this: If you have a removable brace, remove it as told by your health care provider. Put ice in a plastic bag. Place a towel between your skin and the bag. Leave the ice on for 20 minutes, 2-3 times a day. Remove the ice if your skin turns bright red. This is very important. If you cannot feel pain, heat, or cold, you have a greater risk of damage to the area. Move your toes often to reduce stiffness and swelling. Raise (elevate) the injured area above the level of your heart while you are sitting or lying down. Activity Rest your knee. Avoid activities that cause knee pain. Perform stretching and strengthening exercises as told by your health care provider or physical therapist. Return to your normal activities as told by your health care provider. Ask your health care provider what activities are safe for you. General instructions Take over-the-counter and prescription medicines only as told by your health care provider. Use splints, braces, knee supports, or walking aids as directed by your health care provider. Do not use any products that contain nicotine or tobacco, such as cigarettes, e-cigarettes, and chewing tobacco. These can delay healing. If you need help quitting, ask your health care provider. Keep all follow-up visits. This is important. Contact a health care provider if: Your symptoms get worse. You are not improving with home care. Summary Patellofemoral pain syndrome is a condition in which the tissue (cartilage) on the underside of the kneecap (patella) softens or breaks down. This condition causes swelling in the joint linings and bone surfaces in the knee. This leads to pain in the front of the knee. This condition may be treated at home with rest, ice, compression, and elevation (RICE). Use splints, braces, knee supports, or walking aids as directed by your health care provider. This information  is not intended to replace advice given to you by your health care provider. Make sure you discuss any questions you have with your health care provider. Document Revised: 02/21/2020 Document Reviewed: 02/21/2020 Elsevier Patient Education  [place].

## 2022-12-03 NOTE — Progress Notes (Signed)
Patient ID: Kristen Watkins, female   DOB: 29-Aug-1986, 37 y.o.   MRN: [id number]   Kristen Watkins, is a 37 y.o. female  [id number]  [id number]  DOB - September 17, 1986  Chief Complaint  Patient presents with   Fatigue   Knee Pain       Subjective:   Kristen Watkins is a 37 y.o. female here today for fatigue.  Her 4th baby was born about 7 months ago and she is experiencing more fatigue that after previous childbirths.  She is nursing up to 12 times a day-"just whenever he wants it."  Just introduced other foods about 2 weeks ago.  She says she gets 5 to 7 hours of sleep at night.  She has also been losing more hair than she feels like she did after previous pregnancies.  Her nails have been breaking and weak.    Also c/o B knee pain after starting doing some jumping in place for exercise.    No problems updated.  ALLERGIES: No Known Allergies  PAST MEDICAL HISTORY: Past Medical History:  Diagnosis Date   Breast lump in female 06/28/2014   Biopsy done at radiology-->benign lymph node Unchanged 01-25-18   UTI (lower urinary tract infection) 03/01/2014   Serratia and Enterococcus     MEDICATIONS AT HOME: Prior to Admission medications   Medication Sig Start Date End Date Taking? Authorizing Provider  acetaminophen (TYLENOL) 500 MG tablet Take 2 tablets (1,000 mg total) by mouth every 6 (six) hours. Patient not taking: Reported on 06/18/2022 05/14/22   Mercado-Ortiz, Ernestine Conrad, DO  ibuprofen (ADVIL) 600 MG tablet Take 1 tablet (600 mg total) by mouth every 6 (six) hours. Patient not taking: Reported on 06/18/2022 05/19/22   Luvenia Redden, PA-C  Prenatal 27-1 MG TABS Take 1 tablet by mouth daily. Patient not taking: Reported on 12/03/2022 06/18/22   Constant, Peggy, MD  Vitamin D, Ergocalciferol, 50000 units CAPS Take 1 capsule by mouth once a week. Patient not taking: Reported on 12/03/2022 03/23/22   Anyanwu, Laray Anger A, MD    ROS: Neg HEENT Neg resp Neg cardiac Neg GI Neg GU Neg  psych Neg neuro  Objective:   Vitals:   12/03/22 1613  BP: 101/68  Pulse: 68  SpO2: 97%  Weight: 208 lb 6.4 oz (94.5 kg)   Exam General appearance : Awake, alert, not in any distress. Speech Clear. Not toxic looking HEENT: Atraumatic and Normocephalic Neck: Supple, no JVD. No cervical lymphadenopathy.  Chest: Good air entry bilaterally, CTAB.  No rales/rhonchi/wheezing CVS: S1 S2 regular, no murmurs.  Knees-ROM normal.  Mild b crepitus with extension.  Ligaments stable Extremities: B/L Lower Ext shows no edema, both legs are warm to touch Neurology: Awake alert, and oriented X 3, CN II-XII intact, Non focal Skin: No Rash  Data Review Lab Results  Component Value Date   HGBA1C 5.4 10/28/2021   HGBA1C 5.4 07/15/2021   HGBA1C 5.5 02/19/2021    Assessment & Plan   1. Fatigue, unspecified type Likely due to 4 children(including an infant, breastfeeding, and inadequate rest!) - CBC with Differential/Platelet - TSH  2. Hair loss Likely normal for post partum - CBC with Differential/Platelet  3. Vitamin D deficiency - VITAMIN D 25 Hydroxy (Vit-D Deficiency, Fractures) - CBC with Differential/Platelet  4. Patellofemoral syndrome of both knees Exercises given.  Encouraged low impact exercises(demonstrated) avoid jumping!    Return in about 6 months (around 06/05/2023) for PCP for chronic conditions.  The patient was given clear instructions  to go to ER or return to medical center if symptoms don't improve, worsen or new problems develop. The patient verbalized understanding. The patient was told to call to get lab results if they haven't heard anything in the next week.      Freeman Caldron, PA-C [facility] [place], [place]   12/03/2022, 4:32 PM

## 2022-12-04 ENCOUNTER — Telehealth: Payer: Self-pay

## 2022-12-04 LAB — CBC WITH DIFFERENTIAL/PLATELET
Basophils Absolute: 0.1 10*3/uL (ref 0.0–0.2)
Basos: 1 %
EOS (ABSOLUTE): 0.2 10*3/uL (ref 0.0–0.4)
Eos: 3 %
Hematocrit: 37.4 % (ref 34.0–46.6)
Hemoglobin: 12 g/dL (ref 11.1–15.9)
Immature Grans (Abs): 0 10*3/uL (ref 0.0–0.1)
Immature Granulocytes: 0 %
Lymphocytes Absolute: 3.7 10*3/uL — ABNORMAL HIGH (ref 0.7–3.1)
Lymphs: 50 %
MCH: 26.4 pg — ABNORMAL LOW (ref 26.6–33.0)
MCHC: 32.1 g/dL (ref 31.5–35.7)
MCV: 82 fL (ref 79–97)
Monocytes Absolute: 0.5 10*3/uL (ref 0.1–0.9)
Monocytes: 7 %
Neutrophils Absolute: 2.9 10*3/uL (ref 1.4–7.0)
Neutrophils: 39 %
Platelets: 422 10*3/uL (ref 150–450)
RBC: 4.54 x10E6/uL (ref 3.77–5.28)
RDW: 14.1 % (ref 11.7–15.4)
WBC: 7.4 10*3/uL (ref 3.4–10.8)

## 2022-12-04 LAB — TSH: TSH: 3.05 u[IU]/mL (ref 0.450–4.500)

## 2022-12-04 LAB — VITAMIN D 25 HYDROXY (VIT D DEFICIENCY, FRACTURES): Vit D, 25-Hydroxy: 15.4 ng/mL — ABNORMAL LOW (ref 30.0–100.0)

## 2022-12-04 NOTE — Telephone Encounter (Signed)
-----   Message from Carilyn Goodpasture, RN sent at 12/04/2022 11:17 AM EDT -----  ----- Message ----- From: Argentina Donovan, PA-C Sent: 12/04/2022  10:11 AM EDT To: Carilyn Goodpasture, RN  No anemia. Thyroid is normal. Vitamin D is low. This can affect depression, energy, bone health. Take 2000 units vitamin D daily while breastfeeding. She can buy this over the counter. We will recheck this in a few months. Thanks, Freeman Caldron, PA-C

## 2022-12-04 NOTE — Telephone Encounter (Signed)
Pt was called and is aware of results, DOB was confirmed.  ?

## 2022-12-07 ENCOUNTER — Encounter: Payer: Self-pay | Admitting: Family Medicine

## 2022-12-07 ENCOUNTER — Other Ambulatory Visit: Payer: Self-pay | Admitting: Physician Assistant

## 2022-12-07 ENCOUNTER — Encounter: Payer: Self-pay | Admitting: Physician Assistant

## 2022-12-07 DIAGNOSIS — D72828 Other elevated white blood cell count: Secondary | ICD-10-CM

## 2022-12-07 DIAGNOSIS — R7989 Other specified abnormal findings of blood chemistry: Secondary | ICD-10-CM

## 2022-12-25 ENCOUNTER — Inpatient Hospital Stay: Payer: Medicaid Other | Admitting: Hematology

## 2022-12-25 ENCOUNTER — Inpatient Hospital Stay: Payer: Medicaid Other

## 2022-12-31 ENCOUNTER — Encounter: Payer: Medicaid Other | Admitting: Hematology

## 2022-12-31 ENCOUNTER — Other Ambulatory Visit: Payer: Medicaid Other

## 2022-12-31 ENCOUNTER — Telehealth: Payer: Self-pay | Admitting: Hematology

## 2022-12-31 NOTE — Telephone Encounter (Signed)
Cancelled appt Per 4/11 secure chat with provider. Appt is not necessary as last cbc was normal. Left message with pt explaining that they do not need appt and we are cancelling appt for today. Left my number for any further questions

## 2023-02-02 ENCOUNTER — Ambulatory Visit: Payer: Medicaid Other | Admitting: Family Medicine

## 2023-04-20 ENCOUNTER — Encounter: Payer: Self-pay | Admitting: Family Medicine

## 2023-06-07 ENCOUNTER — Encounter: Payer: Self-pay | Admitting: Family Medicine

## 2023-06-07 ENCOUNTER — Ambulatory Visit: Payer: Medicaid Other | Attending: Family Medicine | Admitting: Family Medicine

## 2023-06-07 VITALS — BP 100/68 | HR 63 | Ht 66.0 in | Wt 209.6 lb

## 2023-06-07 DIAGNOSIS — L659 Nonscarring hair loss, unspecified: Secondary | ICD-10-CM

## 2023-06-07 DIAGNOSIS — Z131 Encounter for screening for diabetes mellitus: Secondary | ICD-10-CM | POA: Diagnosis not present

## 2023-06-07 DIAGNOSIS — R5383 Other fatigue: Secondary | ICD-10-CM

## 2023-06-07 DIAGNOSIS — R413 Other amnesia: Secondary | ICD-10-CM

## 2023-06-07 DIAGNOSIS — N3946 Mixed incontinence: Secondary | ICD-10-CM | POA: Diagnosis not present

## 2023-06-07 NOTE — Progress Notes (Signed)
Subjective:  Patient ID: Kristen Watkins, female    DOB: 1986-06-26  Age: 37 y.o. MRN: [id number]  CC: Medical Management of Chronic Issues (Hair loss/Can not hold urine)   HPI [name] is a 37 y.o. year old female here for an office visit.  Interval History: Discussed the use of AI scribe software for clinical note transcription with the patient, who gave verbal consent to proceed.  She presents with urinary incontinence and hair loss. The urinary incontinence began after the birth of her last child, a year ago, and has not improved. She reports stress incontinence, with leakage during coughing or sneezing, and urge incontinence, with difficulty controlling the urge to urinate.  The patient's last delivery was a C-section following a prolonged labor, but there were no reported complications such as tears. She has attempted Kegel exercises without significant improvement.  The patient also reports hair loss and brittle nails, which began after the birth of her last child. She describes feeling weak and experiencing memory issues, forgetting details but not significant events or appointments. She denies any significant family history of early menopause but is unsure of the exact age of menopause onset in her family members.   She has questions about initiating GLP-1 RA which she was on prior to her pregnancy.       Past Medical History:  Diagnosis Date   Breast lump in female 06/28/2014   Biopsy done at radiology-->benign lymph node Unchanged 01-25-18   UTI (lower urinary tract infection) 03/01/2014   Serratia and Enterococcus     Past Surgical History:  Procedure Laterality Date   CESAREAN SECTION N/A 05/11/2022   Procedure: CESAREAN SECTION;  Surgeon: Venora Maples, MD;  Location: [facility];  Service: Obstetrics;  Laterality: N/A;   NO PAST SURGERIES      Family History  Problem Relation Age of Onset   Alcohol abuse Neg Hx    Arthritis Neg Hx    Asthma Neg Hx    Birth  defects Neg Hx    Cancer Neg Hx    COPD Neg Hx    Depression Neg Hx    Diabetes Neg Hx    Drug abuse Neg Hx    Early death Neg Hx    Heart disease Neg Hx    Hearing loss Neg Hx    Hyperlipidemia Neg Hx    Hypertension Neg Hx    Kidney disease Neg Hx    Learning disabilities Neg Hx    Mental illness Neg Hx    Mental retardation Neg Hx    Miscarriages / Stillbirths Neg Hx    Stroke Neg Hx    Vision loss Neg Hx    Varicose Veins Neg Hx     Social History   Socioeconomic History   Marital status: Married    Spouse name: Not on file   Number of children: Not on file   Years of education: Not on file   Highest education level: Not on file  Occupational History   Not on file  Tobacco Use   Smoking status: Never   Smokeless tobacco: Never  Vaping Use   Vaping status: Never Used  Substance and Sexual Activity   Alcohol use: No   Drug use: No   Sexual activity: Yes    Birth control/protection: None  Other Topics Concern   Not on file  Social History Narrative   Not on file   Social Determinants of Health   Financial Resource Strain: Not  on file  Food Insecurity: No Food Insecurity (04/30/2022)   Hunger Vital Sign    Worried About Running Out of Food in the Last Year: Never true    Ran Out of Food in the Last Year: Never true  Transportation Needs: No Transportation Needs (04/30/2022)   PRAPARE - Administrator, Civil Service (Medical): No    Lack of Transportation (Non-Medical): No  Physical Activity: Not on file  Stress: Not on file  Social Connections: Not on file    No Known Allergies  Outpatient Medications Prior to Visit  Medication Sig Dispense Refill   Vitamin D, Ergocalciferol, 50000 units CAPS Take 1 capsule by mouth once a week. 12 capsule 3   acetaminophen (TYLENOL) 500 MG tablet Take 2 tablets (1,000 mg total) by mouth every 6 (six) hours. (Patient not taking: Reported on 06/18/2022) 30 tablet 0   ibuprofen (ADVIL) 600 MG tablet Take 1  tablet (600 mg total) by mouth every 6 (six) hours. (Patient not taking: Reported on 06/18/2022) 30 tablet 0   Prenatal 27-1 MG TABS Take 1 tablet by mouth daily. (Patient not taking: Reported on 06/07/2023) 90 tablet 3   No facility-administered medications prior to visit.     ROS Review of Systems  Constitutional:  Positive for fatigue. Negative for activity change and appetite change.  HENT:  Negative for sinus pressure and sore throat.   Respiratory:  Negative for chest tightness, shortness of breath and wheezing.   Cardiovascular:  Negative for chest pain and palpitations.  Gastrointestinal:  Negative for abdominal distention, abdominal pain and constipation.  Genitourinary: Negative.   Musculoskeletal: Negative.   Psychiatric/Behavioral:  Negative for behavioral problems and dysphoric mood.     Objective:  BP 100/68   Pulse 63   Ht 5\' 6"  (1.676 m)   Wt 209 lb 9.6 oz (95.1 kg)   SpO2 100%   BMI 33.83 kg/m      06/07/2023    4:01 PM 12/03/2022    4:13 PM 06/18/2022    9:35 AM  BP/Weight  Systolic BP 100 101 105  Diastolic BP 68 68 70  Wt. (Lbs) 209.6 208.4 219.7  BMI 33.83 kg/m2 33.64 kg/m2 35.46 kg/m2      Physical Exam Constitutional:      Appearance: She is well-developed. She is obese.  Cardiovascular:     Rate and Rhythm: Normal rate.     Heart sounds: Normal heart sounds. No murmur heard. Pulmonary:     Effort: Pulmonary effort is normal.     Breath sounds: Normal breath sounds. No wheezing or rales.  Chest:     Chest wall: No tenderness.  Abdominal:     General: Bowel sounds are normal. There is no distension.     Palpations: Abdomen is soft. There is no mass.     Tenderness: There is no abdominal tenderness.  Musculoskeletal:        General: Normal range of motion.     Right lower leg: No edema.     Left lower leg: No edema.  Neurological:     Mental Status: She is alert and oriented to person, place, and time.  Psychiatric:        Mood and  Affect: Mood normal.        Latest Ref Rng & Units 05/11/2022    2:41 PM 05/22/2021   11:25 AM 11/20/2019    9:38 AM  CMP  Glucose 70 - 99 mg/dL  79  85  BUN 6 - 20 mg/dL  10  10   Creatinine 1.61 - 1.00 mg/dL  0.96  0.45   Sodium 409 - 145 mmol/L 135  137  139   Potassium 3.5 - 5.1 mmol/L 3.8  3.9  4.3   Chloride 98 - 111 mmol/L  109  105   CO2 22 - 32 mmol/L  22  20   Calcium 8.9 - 10.3 mg/dL  8.0  9.3   Total Protein 6.0 - 8.5 g/dL   7.1   Total Bilirubin 0.0 - 1.2 mg/dL   0.2   Alkaline Phos 39 - 117 IU/L   78   AST 0 - 40 IU/L   15   ALT 0 - 32 IU/L   10     Lipid Panel     Component Value Date/Time   CHOL 168 02/19/2021 1553   TRIG 73 02/19/2021 1553   HDL 52 02/19/2021 1553   CHOLHDL 3.2 02/19/2021 1553   CHOLHDL 3.4 11/04/2016 1044   VLDL 15 11/04/2016 1044   LDLCALC 102 (H) 02/19/2021 1553    CBC    Component Value Date/Time   WBC 7.4 12/03/2022 1639   WBC 20.4 (H) 05/13/2022 0726   RBC 4.54 12/03/2022 1639   RBC 3.74 (L) 05/13/2022 0726   HGB 12.0 12/03/2022 1639   HCT 37.4 12/03/2022 1639   PLT 422 12/03/2022 1639   MCV 82 12/03/2022 1639   MCH 26.4 (L) 12/03/2022 1639   MCH 28.1 05/13/2022 0726   MCHC 32.1 12/03/2022 1639   MCHC 32.6 05/13/2022 0726   RDW 14.1 12/03/2022 1639   LYMPHSABS 3.7 (H) 12/03/2022 1639   MONOABS 360 11/04/2016 1044   EOSABS 0.2 12/03/2022 1639   BASOSABS 0.1 12/03/2022 1639    Lab Results  Component Value Date   HGBA1C 5.4 10/28/2021    Lab Results  Component Value Date   TSH 3.050 12/03/2022    Assessment & Plan:      Mixed stress and urge urinary Incontinence Stress and urge incontinence reported since last childbirth. No improvement with Kegel exercises. -Discussed available treatment options but given her young age we need to evaluate for underlying organic cause -Refer to urogynecologist for further evaluation and management.  Fatigue and Hair Loss Symptoms of fatigue, hair loss, and brittle nails  reported postpartum. -Advised that it is not uncommon after childbirth to experience some degree of hair loss.  -Order blood work including CBC, thyroid function tests, vitamin D, liver and kidney function tests, and A1c. -Recommend over-the-counter Biotin for hair and nail health.  Memory Issues Patient reports memory issues, but no significant cognitive impairment noted. - Possible early menopause suggested due to symptomatology, but patient is relatively young. -No specific intervention at this time.  Weight Management Previous use of Ozempic for weight loss, but insurance coverage issues noted. -Check A1c to determine eligibility for medication. -Advised that there is no guarantee that her insurance will cover this especially without a diagnosis of type 2 diabetes mellitus or diagnosis of prediabetes with cardiovascular disease       No orders of the defined types were placed in this encounter.   Follow-up: Return in about 3 months (around 09/06/2023) for Follow-up on fatigue.       Hoy Register, MD, FAAFP. [facility] [place], [place] [phone]   06/07/2023, 5:26 PM

## 2023-06-07 NOTE — Patient Instructions (Signed)
Fatigue If you have fatigue, you feel tired all the time and have a lack of energy or a lack of motivation. Fatigue may make it difficult to start or complete tasks because of exhaustion. Occasional or mild fatigue is often a normal response to activity or life. However, long-term (chronic) or extreme fatigue may be a symptom of a medical condition such as: Depression. Not having enough red blood cells or hemoglobin in the blood (anemia). A problem with a small gland located in the lower front part of the neck (thyroid disorder). Rheumatologic conditions. These are problems related to the body's defense system (immune system). Infections, especially certain viral infections. Fatigue can also lead to negative health outcomes over time. Follow these instructions at home: Medicines Take over-the-counter and prescription medicines only as told by your health care provider. Take a multivitamin if told by your health care provider. Do not use herbal or dietary supplements unless they are approved by your health care provider. Eating and drinking  Avoid heavy meals in the evening. Eat a well-balanced diet, which includes lean proteins, whole grains, plenty of fruits and vegetables, and low-fat dairy products. Avoid eating or drinking too many products with caffeine in them. Avoid alcohol. Drink enough fluid to keep your urine pale yellow. Activity  Exercise regularly, as told by your health care provider. Use or practice techniques to help you relax, such as yoga, tai chi, meditation, or massage therapy. Lifestyle Change situations that cause you stress. Try to keep your work and personal schedules in balance. Do not use recreational or illegal drugs. General instructions Monitor your fatigue for any changes. Go to bed and get up at the same time every day. Avoid fatigue by pacing yourself during the day and getting enough sleep at night. Maintain a healthy weight. Contact a health care  provider if: Your fatigue does not get better. You have a fever. You suddenly lose or gain weight. You have headaches. You have trouble falling asleep or sleeping through the night. You feel angry, guilty, anxious, or sad. You have swelling in your legs or another part of your body. Get help right away if: You feel confused, feel like you might faint, or faint. Your vision is blurry or you have a severe headache. You have severe pain in your abdomen, your back, or the area between your waist and hips (pelvis). You have chest pain, shortness of breath, or an irregular or fast heartbeat. You are unable to urinate, or you urinate less than normal. You have abnormal bleeding from the rectum, nose, lungs, nipples, or, if you are female, the vagina. You vomit blood. You have thoughts about hurting yourself or others. These symptoms may be an emergency. Get help right away. Call 911. Do not wait to see if the symptoms will go away. Do not drive yourself to the hospital. Get help right away if you feel like you may hurt yourself or others, or have thoughts about taking your own life. Go to your nearest emergency room or: Call 911. Call the National Suicide Prevention Lifeline at (773) 702-8832 or 988. This is open 24 hours a day. Text the Crisis Text Line at 9892064244. Summary If you have fatigue, you feel tired all the time and have a lack of energy or a lack of motivation. Fatigue may make it difficult to start or complete tasks because of exhaustion. Long-term (chronic) or extreme fatigue may be a symptom of a medical condition. Exercise regularly, as told by your health care provider.  Change situations that cause you stress. Try to keep your work and personal schedules in balance. This information is not intended to replace advice given to you by your health care provider. Make sure you discuss any questions you have with your health care provider. Document Revised: 06/30/2021 Document  Reviewed: 06/30/2021 Elsevier Patient Education  2024 [employer].

## 2023-06-08 ENCOUNTER — Other Ambulatory Visit: Payer: Self-pay | Admitting: Family Medicine

## 2023-06-08 DIAGNOSIS — R7989 Other specified abnormal findings of blood chemistry: Secondary | ICD-10-CM

## 2023-06-08 LAB — HEMOGLOBIN A1C
Est. average glucose Bld gHb Est-mCnc: 111 mg/dL
Hgb A1c MFr Bld: 5.5 % (ref 4.8–5.6)

## 2023-06-08 LAB — CBC WITH DIFFERENTIAL/PLATELET
Basophils Absolute: 0.1 10*3/uL (ref 0.0–0.2)
Basos: 1 %
EOS (ABSOLUTE): 0.3 10*3/uL (ref 0.0–0.4)
Eos: 3 %
Hematocrit: 38.5 % (ref 34.0–46.6)
Hemoglobin: 12 g/dL (ref 11.1–15.9)
Immature Grans (Abs): 0 10*3/uL (ref 0.0–0.1)
Immature Granulocytes: 0 %
Lymphocytes Absolute: 4.1 10*3/uL — ABNORMAL HIGH (ref 0.7–3.1)
Lymphs: 50 %
MCH: 27.1 pg (ref 26.6–33.0)
MCHC: 31.2 g/dL — ABNORMAL LOW (ref 31.5–35.7)
MCV: 87 fL (ref 79–97)
Monocytes Absolute: 0.5 10*3/uL (ref 0.1–0.9)
Monocytes: 7 %
Neutrophils Absolute: 3.1 10*3/uL (ref 1.4–7.0)
Neutrophils: 39 %
Platelets: 430 10*3/uL (ref 150–450)
RBC: 4.43 x10E6/uL (ref 3.77–5.28)
RDW: 13.1 % (ref 11.7–15.4)
WBC: 8.1 10*3/uL (ref 3.4–10.8)

## 2023-06-08 LAB — CMP14+EGFR
ALT: 11 IU/L (ref 0–32)
AST: 17 IU/L (ref 0–40)
Albumin: 4.6 g/dL (ref 3.9–4.9)
Alkaline Phosphatase: 89 IU/L (ref 44–121)
BUN/Creatinine Ratio: 21 (ref 9–23)
BUN: 11 mg/dL (ref 6–20)
Bilirubin Total: <0.2 mg/dL (ref 0.0–1.2)
CO2: 20 mmol/L (ref 20–29)
Calcium: 9.4 mg/dL (ref 8.7–10.2)
Chloride: 104 mmol/L (ref 96–106)
Creatinine, Ser: 0.52 mg/dL — ABNORMAL LOW (ref 0.57–1.00)
Globulin, Total: 2.7 g/dL (ref 1.5–4.5)
Glucose: 75 mg/dL (ref 70–99)
Potassium: 4.3 mmol/L (ref 3.5–5.2)
Sodium: 140 mmol/L (ref 134–144)
Total Protein: 7.3 g/dL (ref 6.0–8.5)
eGFR: 123 mL/min/{1.73_m2} (ref >59)

## 2023-06-08 LAB — TSH: TSH: 1.54 u[IU]/mL (ref 0.450–4.500)

## 2023-06-08 LAB — T4, FREE: Free T4: 0.96 ng/dL (ref 0.82–1.77)

## 2023-06-08 LAB — VITAMIN D 25 HYDROXY (VIT D DEFICIENCY, FRACTURES): Vit D, 25-Hydroxy: 15.6 ng/mL — ABNORMAL LOW (ref 30.0–100.0)

## 2023-06-08 MED ORDER — VITAMIN D (ERGOCALCIFEROL) 50000 UNITS PO CAPS: 1.0000 | ORAL_CAPSULE | ORAL | 1 refills | Status: AC

## 2023-07-05 ENCOUNTER — Ambulatory Visit: Payer: Medicaid Other | Admitting: Family Medicine

## 2023-08-01 ENCOUNTER — Encounter: Payer: Self-pay | Admitting: Family Medicine

## 2023-08-09 ENCOUNTER — Encounter: Payer: Self-pay | Admitting: Internal Medicine

## 2023-08-09 ENCOUNTER — Ambulatory Visit: Payer: Medicaid Other | Attending: Internal Medicine | Admitting: Internal Medicine

## 2023-08-09 VITALS — BP 106/71 | HR 93 | Temp 98.6°F | Ht 66.0 in | Wt 205.0 lb

## 2023-08-09 DIAGNOSIS — J069 Acute upper respiratory infection, unspecified: Secondary | ICD-10-CM | POA: Diagnosis not present

## 2023-08-09 DIAGNOSIS — J189 Pneumonia, unspecified organism: Secondary | ICD-10-CM | POA: Diagnosis not present

## 2023-08-09 MED ORDER — AMOXICILLIN-POT CLAVULANATE 500-125 MG PO TABS: 1.0000 | ORAL_TABLET | Freq: Two times a day (BID) | ORAL | 0 refills | Status: DC

## 2023-08-09 MED ORDER — ALBUTEROL SULFATE HFA 108 (90 BASE) MCG/ACT IN AERS: 2.0000 | INHALATION_SPRAY | Freq: Four times a day (QID) | RESPIRATORY_TRACT | 0 refills | Status: AC | PRN

## 2023-08-09 NOTE — Progress Notes (Signed)
Patient ID: Kristen Watkins, female    DOB: Jan 02, 1986  MRN: [id number]  CC: Cough (Cough, fever, SOB, fatigue - worsening X10 days/Abdominal pain & R & L flank pain due to cough (sore)/Negative home Covid test 08/03/23)   Subjective: Kristen Watkins is a 37 y.o. female who presents for UC visit Her concerns today include:   Pt c/o Cough, fever, SOB, fatigue - worsening X10 days/Abdominal pain & R & L flank pain due to cough (sore)/Negative home Covid test 08/03/23) Cough productive of yellow phlegm, endorses sore throat. Chest pain and abdominal pain when she coughs. Fever stopped 3 days ago All 4 kids were sick with similar symptoms except fever.  But now better. Using OTC Cold and Flu med and Tylenol.  Last took Tylenol this a.m   Patient Active Problem List   Diagnosis Date Noted   Postpartum hemorrhage 05/14/2022   Encounter for initial prescription of implantable subdermal contraceptive    LGA (large for gestational age) fetus affecting management of mother 05/10/2022   Post term pregnancy over 40 weeks 05/10/2022   Group B Streptococcus carrier, +RV culture, currently pregnant 04/12/2022   Low vitamin D level 04/09/2022   Obesity in pregnancy, antepartum 12/15/2021   Supervision of high risk pregnancy, antepartum 10/14/2021   AMA (advanced maternal age) multigravida 35+ 10/14/2021     Current Outpatient Medications on File Prior to Visit  Medication Sig Dispense Refill   acetaminophen (TYLENOL) 500 MG tablet Take 2 tablets (1,000 mg total) by mouth every 6 (six) hours. 30 tablet 0   ibuprofen (ADVIL) 600 MG tablet Take 1 tablet (600 mg total) by mouth every 6 (six) hours. 30 tablet 0   Vitamin D, Ergocalciferol, 50000 units CAPS Take 1 capsule by mouth once a week. 12 capsule 1   Prenatal 27-1 MG TABS Take 1 tablet by mouth daily. (Patient not taking: Reported on 06/07/2023) 90 tablet 3   No current facility-administered medications on file prior to visit.    No Known  Allergies  Social History   Socioeconomic History   Marital status: Married    Spouse name: Not on file   Number of children: Not on file   Years of education: Not on file   Highest education level: Not on file  Occupational History   Not on file  Tobacco Use   Smoking status: Never   Smokeless tobacco: Never  Vaping Use   Vaping status: Never Used  Substance and Sexual Activity   Alcohol use: No   Drug use: No   Sexual activity: Yes    Birth control/protection: None  Other Topics Concern   Not on file  Social History Narrative   Not on file   Social Determinants of Health   Financial Resource Strain: Not on file  Food Insecurity: No Food Insecurity (04/30/2022)   Hunger Vital Sign    Worried About Running Out of Food in the Last Year: Never true    Ran Out of Food in the Last Year: Never true  Transportation Needs: No Transportation Needs (04/30/2022)   PRAPARE - Administrator, Civil Service (Medical): No    Lack of Transportation (Non-Medical): No  Physical Activity: Not on file  Stress: Not on file  Social Connections: Not on file  Intimate Partner Violence: Not on file    Family History  Problem Relation Age of Onset   Alcohol abuse Neg Hx    Arthritis Neg Hx    Asthma Neg Hx  Birth defects Neg Hx    Cancer Neg Hx    COPD Neg Hx    Depression Neg Hx    Diabetes Neg Hx    Drug abuse Neg Hx    Early death Neg Hx    Heart disease Neg Hx    Hearing loss Neg Hx    Hyperlipidemia Neg Hx    Hypertension Neg Hx    Kidney disease Neg Hx    Learning disabilities Neg Hx    Mental illness Neg Hx    Mental retardation Neg Hx    Miscarriages / Stillbirths Neg Hx    Stroke Neg Hx    Vision loss Neg Hx    Varicose Veins Neg Hx     Past Surgical History:  Procedure Laterality Date   CESAREAN SECTION N/A 05/11/2022   Procedure: CESAREAN SECTION;  Surgeon: Venora Maples, MD;  Location: [facility];  Service: Obstetrics;  Laterality: N/A;   NO  PAST SURGERIES      ROS: Review of Systems Negative except as stated above  PHYSICAL EXAM: BP 106/71 (BP Location: Left Arm, Patient Position: Sitting, Cuff Size: Large)   Pulse 93   Temp 98.6 F (37 C) (Oral)   Ht 5\' 6"  (1.676 m)   Wt 205 lb (93 kg)   SpO2 98%   BMI 33.09 kg/m   Physical Exam  General appearance - alert, well appearing, middle-age female and in no distress Mental status - normal mood, behavior, speech, dress, motor activity, and thought processes Nose - normal and patent, no erythema, discharge or polyps Mouth - mucous membranes moist, pharynx normal without lesions Neck - supple, no significant adenopathy Chest -fine crackles heard in the left lower base.  The lung fields are clear with good air entry. Heart - normal rate, regular rhythm, normal S1, S2, no murmurs, rubs, clicks or gallops      Latest Ref Rng & Units 06/07/2023    4:55 PM 05/11/2022    2:41 PM 05/22/2021   11:25 AM  CMP  Glucose 70 - 99 mg/dL 75   79   BUN 6 - 20 mg/dL 11   10   Creatinine 1.19 - 1.00 mg/dL 1.47   8.29   Sodium 562 - 144 mmol/L 140  135  137   Potassium 3.5 - 5.2 mmol/L 4.3  3.8  3.9   Chloride 96 - 106 mmol/L 104   109   CO2 20 - 29 mmol/L 20   22   Calcium 8.7 - 10.2 mg/dL 9.4   8.0   Total Protein 6.0 - 8.5 g/dL 7.3     Total Bilirubin 0.0 - 1.2 mg/dL <1.3     Alkaline Phos 44 - 121 IU/L 89     AST 0 - 40 IU/L 17     ALT 0 - 32 IU/L 11      Lipid Panel     Component Value Date/Time   CHOL 168 02/19/2021 1553   TRIG 73 02/19/2021 1553   HDL 52 02/19/2021 1553   CHOLHDL 3.2 02/19/2021 1553   CHOLHDL 3.4 11/04/2016 1044   VLDL 15 11/04/2016 1044   LDLCALC 102 (H) 02/19/2021 1553    CBC    Component Value Date/Time   WBC 8.1 06/07/2023 1655   WBC 20.4 (H) 05/13/2022 0726   RBC 4.43 06/07/2023 1655   RBC 3.74 (L) 05/13/2022 0726   HGB 12.0 06/07/2023 1655   HCT 38.5 06/07/2023 1655   PLT 430  06/07/2023 1655   MCV 87 06/07/2023 1655   MCH 27.1  06/07/2023 1655   MCH 28.1 05/13/2022 0726   MCHC 31.2 (L) 06/07/2023 1655   MCHC 32.6 05/13/2022 0726   RDW 13.1 06/07/2023 1655   LYMPHSABS 4.1 (H) 06/07/2023 1655   MONOABS 360 11/04/2016 1044   EOSABS 0.3 06/07/2023 1655   BASOSABS 0.1 06/07/2023 1655    ASSESSMENT AND PLAN: 1. Viral upper respiratory tract infection Patient most likely had the flu or COVID.  She has some crackles heard in the left lung base.  Will cover with antibiotics for possible bacterial coinfection.  Placed on Augmentin.  Pt is breast feeding.  Advise to observe for any diarrhea in baby.  Use a teaspoon of honey as needed for cough.  Her insurance does not cover [employer].  2. Community acquired pneumonia of left lower lobe of lung - amoxicillin-clavulanate (AUGMENTIN) 500-125 MG tablet; Take 1 tablet by mouth in the morning and at bedtime.  Dispense: 10 tablet; Refill: 0 - albuterol (VENTOLIN HFA) 108 (90 Base) MCG/ACT inhaler; Inhale 2 puffs into the lungs every 6 (six) hours as needed for wheezing or shortness of breath.  Dispense: 8 g; Refill: 0    Patient was given the opportunity to ask questions.  Patient verbalized understanding of the plan and was able to repeat key elements of the plan.   This documentation was completed using Paediatric nurse.  Any transcriptional errors are unintentional.  No orders of the defined types were placed in this encounter.    Requested Prescriptions   Signed Prescriptions Disp Refills   amoxicillin-clavulanate (AUGMENTIN) 500-125 MG tablet 10 tablet 0    Sig: Take 1 tablet by mouth in the morning and at bedtime.   albuterol (VENTOLIN HFA) 108 (90 Base) MCG/ACT inhaler 8 g 0    Sig: Inhale 2 puffs into the lungs every 6 (six) hours as needed for wheezing or shortness of breath.    No follow-ups on file.  Jonah Blue, MD, FACP

## 2023-09-07 ENCOUNTER — Ambulatory Visit: Payer: Medicaid Other | Admitting: Family Medicine

## 2023-09-16 ENCOUNTER — Ambulatory Visit: Payer: Medicaid Other | Admitting: Obstetrics and Gynecology

## 2023-09-16 ENCOUNTER — Other Ambulatory Visit (HOSPITAL_COMMUNITY)
Admission: RE | Admit: 2023-09-16 | Discharge: 2023-09-16 | Disposition: A | Discharge status: Home / Self Care | Payer: Medicaid Other | Attending: Obstetrics and Gynecology | Admitting: Obstetrics and Gynecology

## 2023-09-16 ENCOUNTER — Encounter: Payer: Self-pay | Admitting: Obstetrics and Gynecology

## 2023-09-16 VITALS — BP 102/56 | HR 79 | Ht 64.0 in | Wt 204.0 lb

## 2023-09-16 DIAGNOSIS — R319 Hematuria, unspecified: Secondary | ICD-10-CM

## 2023-09-16 DIAGNOSIS — R350 Frequency of micturition: Secondary | ICD-10-CM | POA: Insufficient documentation

## 2023-09-16 DIAGNOSIS — N3941 Urge incontinence: Secondary | ICD-10-CM

## 2023-09-16 DIAGNOSIS — N393 Stress incontinence (female) (male): Secondary | ICD-10-CM | POA: Diagnosis not present

## 2023-09-16 DIAGNOSIS — N8110 Cystocele, unspecified: Secondary | ICD-10-CM

## 2023-09-16 LAB — POCT URINALYSIS DIPSTICK
Bilirubin, UA: NEGATIVE
Glucose, UA: NEGATIVE
Ketones, UA: NEGATIVE
Leukocytes, UA: NEGATIVE
Nitrite, UA: NEGATIVE
Protein, UA: NEGATIVE
Spec Grav, UA: >=1.03 — AB (ref 1.010–1.025)
Urobilinogen, UA: 0.2 U/dL
pH, UA: 5.5 (ref 5.0–8.0)

## 2023-09-16 LAB — URINALYSIS, ROUTINE W REFLEX MICROSCOPIC
Bilirubin Urine: NEGATIVE
Glucose, UA: NEGATIVE mg/dL
Ketones, ur: NEGATIVE mg/dL
Leukocytes,Ua: NEGATIVE
Nitrite: NEGATIVE
Protein, ur: NEGATIVE mg/dL
Specific Gravity, Urine: 1.026 (ref 1.005–1.030)
pH: 5 (ref 5.0–8.0)

## 2023-09-16 NOTE — Patient Instructions (Addendum)
Today we talked about ways to manage bladder urgency such as altering your diet to avoid irritative beverages and foods (bladder diet) as well as attempting to decrease stress and other exacerbating factors.  You can also chew a plain Tums 1-3 times per day to make your urine less acidic, especially if you have eating/drinking acidic things.   There is a website with helpful information for people with bladder irritation, called the IC Network at https://www.ic-network.com. This website has more information about a healthy bladder diet and patient forums for support.  The Most Bothersome Foods* The Least Bothersome Foods*  Coffee - Regular & Decaf Tea - caffeinated Carbonated beverages - cola, non-colas, diet & caffeine-free Alcohols - Beer, Red Wine, White Wine, [address] - Grapefruit, [place], Orange, [employer] - Cranberry, Grapefruit, Orange, Pineapple Vegetables - Tomato & Tomato Products Flavor Enhancers - Hot peppers, Spicy foods, Chili, Horseradish, Vinegar, Monosodium glutamate (MSG) Artificial Sweeteners - NutraSweet, Sweet 'N Low, Equal (sweetener), Saccharin Ethnic foods - [place], [place], [place] food [employer] - low-fat & whole Fruits - Bananas, Blueberries, Honeydew melon, Pears, Raisins, Watermelon Vegetables - Broccoli, [address] Sprouts, [place], Carrots, Cauliflower, [place], Cucumber, Mushrooms, Peas, Radishes, Squash, Zucchini, White potatoes, Sweet potatoes & yams Poultry - Chicken, Eggs, [place], [employer] - Beef, Diplomatic Services operational officer, Lamb Seafood - Shrimp, [place] fish, Salmon Grains - Oat, Rice Snacks - Pretzels, Popcorn  *Lenward Chancellor et al. Diet and its role in interstitial cystitis/bladder pain syndrome (IC/BPS) and comorbid conditions. [employer]. BJU Int. 2012 Jan 11.    We will plan for Urethral bulking for your Stress incontinence (leaking with cough and sneeze). If this does not work for you we can discuss a sling.   You can always consider physical therapy  if you would like.   For the overactive bladder we can consider doing a medication at a later time if you would like.

## 2023-09-16 NOTE — Progress Notes (Signed)
[facility] New Patient Evaluation and Consultation  Referring Provider: Hoy Register, MD PCP: Hoy Register, MD Date of Service: 09/16/2023  SUBJECTIVE Chief Complaint: New Patient (Initial Visit) (Kristen Watkins is a 37 y.o. female here for urinary incontinence.)  History of Present Illness: Kristen Watkins is a 37 y.o.  female seen in consultation at the request of Dr. Alvis Lemmings for evaluation of Mixed incontinence.    Review of records significant for: X3 Vaginal births, 1 c-section.   Urinary Symptoms: Leaks urine with cough/ sneeze and with urgency Leaks intermittently Pad use: 0 pads per day.   Patient is bothered by UI symptoms.  Day time voids 5-7.  Nocturia: 1 times per night to void. Voiding dysfunction:  empties bladder well.  Patient does not use a catheter to empty bladder.  When urinating, patient feels she has no difficulties Drinks: 1-2 Cups Coffee, 1 cup juice, 5 Bottles (60oz) per day  UTIs: 0  UTI's in the last year.   Denies history of blood in urine, kidney or bladder stones, pyelonephritis, bladder cancer, and kidney cancer No results found for the last 90 days.   Pelvic Organ Prolapse Symptoms:                  Patient Denies a feeling of a bulge the vaginal area.   Bowel Symptom: Bowel movements: 1-2 time(s) per day Stool consistency: soft  Straining: no.  Splinting: no.  Incomplete evacuation: no.  Patient Denies accidental bowel leakage / fecal incontinence Bowel regimen: none Last colonoscopy: Date Never   Sexual Function Sexually active: yes.  Sexual orientation: Straight Pain with sex: No  Pelvic Pain Denies pelvic pain    Past Medical History:  Past Medical History:  Diagnosis Date   Breast lump in female 06/28/2014   Biopsy done at radiology-->benign lymph node Unchanged 01-25-18   UTI (lower urinary tract infection) 03/01/2014   Serratia and Enterococcus      Past Surgical History:   Past Surgical History:   Procedure Laterality Date   CESAREAN SECTION N/A 05/11/2022   Procedure: CESAREAN SECTION;  Surgeon: Venora Maples, MD;  Location: [facility];  Service: Obstetrics;  Laterality: N/A;     Past OB/GYN History: [id number] Vaginal deliveries: 3,  Forceps/ Vacuum deliveries: 2, Cesarean section: 1 Menopausal: No, LMP No LMP recorded. Contraception: Implant. Last pap smear was 2021.  Any history of abnormal pap smears: no. HM PAP   This patient has no relevant Health Maintenance data.     Medications: Patient has a current medication list which includes the following prescription(s): acetaminophen, albuterol, biotin, etonogestrel, ibuprofen, and vitamin d (ergocalciferol).   Allergies: Patient has no known allergies.   Social History:  Social History   Tobacco Use   Smoking status: Never   Smokeless tobacco: Never  Vaping Use   Vaping status: Never Used  Substance Use Topics   Alcohol use: No   Drug use: No    Relationship status: married Patient lives with her mother and children.   Patient is employed Arts development officer. Regular exercise: No History of abuse: No  Family History:   Family History  Problem Relation Age of Onset   Alcohol abuse Neg Hx    Arthritis Neg Hx    Asthma Neg Hx    Birth defects Neg Hx    Cancer Neg Hx    COPD Neg Hx    Depression Neg Hx    Diabetes Neg Hx    Drug abuse Neg  Hx    Early death Neg Hx    Heart disease Neg Hx    Hearing loss Neg Hx    Hyperlipidemia Neg Hx    Hypertension Neg Hx    Kidney disease Neg Hx    Learning disabilities Neg Hx    Mental illness Neg Hx    Mental retardation Neg Hx    Miscarriages / Stillbirths Neg Hx    Stroke Neg Hx    Vision loss Neg Hx    Varicose Veins Neg Hx      Review of Systems: Review of Systems  Constitutional:  Negative for chills, fever and malaise/fatigue.  Respiratory:  Negative for cough and shortness of breath.   Cardiovascular:  Negative for chest pain and palpitations.   Gastrointestinal:  Negative for abdominal pain, blood in stool, constipation and diarrhea.  Genitourinary:  Positive for urgency.       +irregular periods  Skin:  Negative for rash.  Neurological:  Negative for weakness.  Endo/Heme/Allergies:  Does not bruise/bleed easily.  Psychiatric/Behavioral:  Negative for depression and suicidal ideas.      OBJECTIVE Physical Exam: Vitals:   09/16/23 1356  BP: (!) 102/56  Pulse: 79  Weight: 204 lb (92.5 kg)  Height: 5\' 4"  (1.626 m)    Physical Exam Vitals reviewed. Exam conducted with a chaperone present.  Constitutional:      Appearance: Normal appearance.  Pulmonary:     Effort: Pulmonary effort is normal.  Abdominal:     Palpations: Abdomen is soft.  Neurological:     General: No focal deficit present.     Mental Status: She is alert and oriented to person, place, and time.  Psychiatric:        Mood and Affect: Mood normal.        Behavior: Behavior normal. Behavior is cooperative.        Thought Content: Thought content normal.      GU / Detailed Urogynecologic Evaluation:  Pelvic Exam: Normal external female genitalia; Bartholin's and Skene's glands normal in appearance; urethral meatus normal in appearance, no urethral masses or discharge.   CST: positive  Speculum exam reveals normal vaginal mucosa without atrophy. Cervix presence of blood from cervical os. Uterus normal single, nontender. Adnexa normal adnexa.     With apex supported, anterior compartment defect was present  Pelvic floor strength II/V  Pelvic floor musculature: Right levator non-tender, Right obturator non-tender, Left levator non-tender, Left obturator non-tender  POP-Q:   POP-Q  -1.5                                            Aa   -1.5                                           Ba  -5                                              C   3.5  Gh  4                                            Pb  9                                             tvl   -2                                            Ap  -2                                            Bp  -6                                              D      Rectal Exam:  Normal external exam.   Post-Void Residual (PVR) by Bladder Scan: In order to evaluate bladder emptying, we discussed obtaining a postvoid residual and patient agreed to this procedure.  Procedure: The ultrasound unit was placed on the patient's abdomen in the suprapubic region after the patient had voided.    Post Void Residual - 09/16/23 1335       Post Void Residual   Post Void Residual 24 mL              Laboratory Results: Lab Results  Component Value Date   COLORU yellow 09/16/2023   CLARITYU clear 09/16/2023   GLUCOSEUR Negative 09/16/2023   BILIRUBINUR Negative 09/16/2023   KETONESU Negative 09/16/2023   SPECGRAV >=1.030 (A) 09/16/2023   RBCUR moderate 09/16/2023   PHUR 5.5 09/16/2023   PROTEINUR Negative 09/16/2023   UROBILINOGEN 0.2 09/16/2023   LEUKOCYTESUR Negative 09/16/2023    Lab Results  Component Value Date   CREATININE 0.52 (L) 06/07/2023   CREATININE 0.44 05/22/2021   CREATININE 0.54 (L) 11/20/2019    Lab Results  Component Value Date   HGBA1C 5.5 06/07/2023    Lab Results  Component Value Date   HGB 12.0 06/07/2023     ASSESSMENT AND PLAN Ms. Leman is a 37 y.o. with:  1. SUI (stress urinary incontinence, female)   2. Urinary frequency   3. Urge incontinence of urine   4. Pelvic organ prolapse quantification stage 1 cystocele   5. Hematuria, unspecified type    Patient had a positive cough stress test on exam today.  She reports that her stress urinary incontinence is more bothersome to her than her urge urinary incontinence.  She also reports that the SUI happens more frequently lately UUI.  We discussed in depth options between pelvic floor physical therapy, urethral bulking, and surgical sling.  Patient  reports that she is most interested in pursuing urethral bulking at this time.  She says that while she does not plan to have more children she is not planning to do anything to prevent children besides her Nexplanon implant due  to religious reasons.  With this in mind we discussed that the urethral bulking does not work well for her we could consider moving forward with a surgical sling with the knowledge that if she were to become pregnant after having a sling placed it would not be as effective. For patient's urinary frequency we discussed options including medication, pelvic floor physical therapy, doing Kegels at home.  Patient reports she is open to doing Kegel's at home but does not have an interest in formal pelvic floor physical therapy at this time.  We discussed that picking up a "jellybean" with her vagina is a good way to mentally tell herself to do a Kegel and then to pull the jellybean up toward the bellybutton.  She was able to do this on exam.  She was encouraged to do this at home 5-10 times a day. If patient would like occasion we can discuss this further at another visit.  She is most interested in moving forward with urethral bulking at this time.  Will plan for urethral bulking with one of the surgeons. Patient has a stage I cystocele, stage I uterine, stage I posterior prolapse.  I discussed with patient that these are all early stages of prolapse and that there is nothing to be concerned about at this time.  Using the POP-Q tool we discussed her pelvic floor deficits.  I told her that if she were to have any significant pressure or it felt like a tampon in the vagina constantly, or if she felt like she was having more trouble emptying her bladder that she may want intervention then.  As of now we will plan for expectant management. Patient had blood in her urine today that was moderate in nature.  On exam she has a small amount of blood coming from the cervix.  She did report that with  her Nexplanon implant she often has irregular periods and will have bleeding at times and no bleeding at others.  I have a low suspicion that there is concern for the blood in the urine but will send for micro.  Patient to return for urethral bulking.   Selmer Dominion, NP

## 2023-10-11 NOTE — Progress Notes (Addendum)
Precertification check for Urethral Bulking Insurance: [facility] provider portal contacted CPT code: 16109, [phone] Dx code: N39.3 (No)  Prior auth needed  Notification or Prior Authorization is not required for the requested services  This [employer] Medicaid members plan does not currently require a prior authorization for these services. If you have general questions about the prior authorization requirements, please call us at [phone] or visit [email or web address] and select the state where you practice. The number above acknowledges your notification. Please write this number down for future reference. Notification or Prior Authorization does not confirm benefit coverage and is not a guarantee of coverage or payment. Decision ID #: [id number]  Decision scanned into chart

## 2023-11-18 ENCOUNTER — Encounter: Payer: Self-pay | Admitting: Obstetrics

## 2023-11-18 ENCOUNTER — Ambulatory Visit: Payer: Medicaid Other | Admitting: Obstetrics

## 2023-11-18 ENCOUNTER — Ambulatory Visit (INDEPENDENT_AMBULATORY_CARE_PROVIDER_SITE_OTHER): Payer: Medicaid Other | Admitting: Obstetrics

## 2023-11-18 ENCOUNTER — Other Ambulatory Visit (HOSPITAL_COMMUNITY)
Admission: RE | Admit: 2023-11-18 | Discharge: 2023-11-18 | Disposition: A | Discharge status: Home / Self Care | Attending: Obstetrics | Admitting: Obstetrics

## 2023-11-18 VITALS — BP 107/56 | HR 73

## 2023-11-18 DIAGNOSIS — N393 Stress incontinence (female) (male): Secondary | ICD-10-CM | POA: Insufficient documentation

## 2023-11-18 LAB — POCT URINALYSIS DIPSTICK
Bilirubin, UA: NEGATIVE
Glucose, UA: NEGATIVE
Ketones, UA: NEGATIVE
Leukocytes, UA: NEGATIVE
Nitrite, UA: NEGATIVE
Protein, UA: NEGATIVE
Spec Grav, UA: 1.025 (ref 1.010–1.025)
Urobilinogen, UA: 0.2 U/dL
pH, UA: 6 (ref 5.0–8.0)

## 2023-11-18 LAB — URINALYSIS, ROUTINE W REFLEX MICROSCOPIC
Bilirubin Urine: NEGATIVE
Glucose, UA: NEGATIVE mg/dL
Hgb urine dipstick: NEGATIVE
Ketones, ur: NEGATIVE mg/dL
Leukocytes,Ua: NEGATIVE
Nitrite: NEGATIVE
Protein, ur: NEGATIVE mg/dL
Specific Gravity, Urine: 1.026 (ref 1.005–1.030)
pH: 6 (ref 5.0–8.0)

## 2023-11-18 MED ORDER — CIPROFLOXACIN HCL 500 MG PO TABS: 500.0000 mg | ORAL_TABLET | Freq: Once | ORAL | Status: AC

## 2023-11-18 NOTE — Patient Instructions (Addendum)
 Taking Care of Yourself after Urodynamics, Cystoscopy, Bulkamid Injection, or Botox Injection   Drink plenty of water for a day or two following your procedure. Try to have about 8 ounces (one cup) at a time, and do this 6 times or more per day unless you have fluid restrictitons AVOID irritative beverages such as coffee, tea, soda, alcoholic or citrus drinks for a day or two, as this may cause burning with urination.  For the first 1-2 days after the procedure, your urine may be pink or red in color. You may have some blood in your urine as a normal side effect of the procedure. Large amounts of bleeding or difficulty urinating are NOT normal. Call the nurse line if this happens or go to the nearest Emergency Room if the bleeding is heavy or you cannot urinate at all and it is after hours. If you had a Bulkamid injection in the urethra and need to be catheterized, ask for a pediatric catheter to be used (size 10 or 12-French) so the material is not pushed out of place.   You may experience some discomfort or a burning sensation with urination after having this procedure. You can use over the counter Azo or pyridium to help with burning and follow the instructions on the packaging. If it does not improve within 1-2 days, or other symptoms appear (fever, chills, or difficulty urinating) call the office to speak to a nurse.  You may return to normal daily activities such as work, school, driving, exercising and housework on the day of the procedure. If your doctor gave you a prescription, take it as ordered.   She prefers an office procedure with urethral bulking (Bulkamid). We discussed success rate of approximately 70-80% and possible need for second injection. We reviewed that this is not a permanent procedure and the Bulkamid does dissolve over time. Risks reviewed including injury to bladder or urethra, UTI, urinary retention and hematuria.

## 2023-11-18 NOTE — Addendum Note (Signed)
 Addended by: Alexis Frock on: 11/18/2023 02:38 PM   Modules accepted: Orders

## 2023-11-18 NOTE — Progress Notes (Signed)
 Bulkamid Injection  CC: 38 y.o. y.o. F with stress dominant mixed urinary incontinence who presents for transurethral Bulkamid injection.  Patient signed her consent form.  She started antibiotic prophylaxis today.  Today's Vitals   11/18/23 1116  BP: (!) 107/56  Pulse: 73    Results for orders placed or performed in visit on 11/18/23 (from the past 24 hours)  POCT Urinalysis Dipstick     Status: None   Collection Time: 11/18/23 12:23 PM  Result Value Ref Range   Color, UA Yellow    Clarity, UA Clear    Glucose, UA Negative Negative   Bilirubin, UA Negative    Ketones, UA Negative    Spec Grav, UA 1.025 1.010 - 1.025   Blood, UA Trace    pH, UA 6.0 5.0 - 8.0   Protein, UA Negative Negative   Urobilinogen, UA 0.2 0.2 or 1.0 E.U./dL   Nitrite, UA Negative    Leukocytes, UA Negative Negative   Appearance     Odor      Procedure: Time out was performed. The bladder was catheterized and 10 ml of 2% lidocaine jelly placed in the urethra. A urethral block was performed by injecting 3ml of 1% lidocaine with epinephrine at 3 and 9 o'clock adjacent to the urethra.  The needle was primed.  The cystoscope was inserted to the level of the bladder neck.  The needle was inserted 2 cm and the scope was pulled back into the urethra 2 cm.  The needle was inserted bevel up at the 5 o'clock position and the Bulkamid was injected to obtain coaptation.  This was repeated at the 2 o'clock,  10 o'clock and 7 o'clock positions.   A total of 2- 1ml syringes were used and good circumferential coaptation was noted.  The patient tolerated the procedure well. She was asked to void after the procedure.  Post-Void Residual (PVR) by Bladder Scan: In order to evaluate bladder emptying, we discussed obtaining a postvoid residual and she agreed to this procedure.  Procedure: The ultrasound unit was placed on the patient's abdomen in the suprapubic region after the patient had voided. A PVR of 51 ml was obtained by  bladder scan.   Post Void Residual - 11/18/23 1157       Post Void Residual   Post Void Residual 51 mL              ASSESSMENT: 38 y.o. y.o. s/p transurethral Bulkamid injection for stress incontinence.   SUI (stress urinary incontinence, female) Assessment & Plan: - POCT + trace heme, pending UA microscopy and culture - For treatment of stress urinary incontinence,  non-surgical options include expectant management, weight loss, physical therapy, as well as a pessary.  Surgical options include a midurethral sling, Burch urethropexy, and transurethral injection of a bulking agent. - She prefers an office procedure with urethral bulking (Bulkamid). We discussed success rate of approximately 70-80% and possible need for second injection. We reviewed that this is not a permanent procedure and the Bulkamid does dissolve over time. Risks reviewed including injury to bladder or urethra, UTI, urinary retention and hematuria.   Orders: -     POCT urinalysis dipstick -     Urine Microscopic; Future  PLAN: Patient will follow up in 4 weeks to reassess. Voiding and post-procedure precautions were given. She will return for heavy bleeding, fevers, dysuria lasting beyond today and incomplete emptying.  All questions were answered.  Loleta Chance, MD

## 2023-11-18 NOTE — Assessment & Plan Note (Signed)
-   POCT + trace heme, pending UA microscopy and culture - For treatment of stress urinary incontinence,  non-surgical options include expectant management, weight loss, physical therapy, as well as a pessary.  Surgical options include a midurethral sling, Burch urethropexy, and transurethral injection of a bulking agent. - She prefers an office procedure with urethral bulking (Bulkamid). We discussed success rate of approximately 70-80% and possible need for second injection. We reviewed that this is not a permanent procedure and the Bulkamid does dissolve over time. Risks reviewed including injury to bladder or urethra, UTI, urinary retention and hematuria.

## 2023-11-25 ENCOUNTER — Encounter: Payer: Self-pay | Admitting: Obstetrics

## 2023-12-16 ENCOUNTER — Telehealth: Payer: Self-pay | Admitting: Obstetrics

## 2023-12-16 ENCOUNTER — Other Ambulatory Visit: Payer: Self-pay | Admitting: Obstetrics

## 2023-12-16 DIAGNOSIS — N8110 Cystocele, unspecified: Secondary | ICD-10-CM

## 2023-12-16 DIAGNOSIS — N393 Stress incontinence (female) (male): Secondary | ICD-10-CM

## 2023-12-16 NOTE — Progress Notes (Signed)
 Pt failed periurethral bulking, desires to proceed with surgical intervention.  Order placed for surgery scheduling.

## 2023-12-16 NOTE — Telephone Encounter (Signed)
 Pt texted NO to her follow up appt tomorrow 3/28.  I called her to reschedule and she said that the Urethral Bulking has not helped her, and she has decided to do surgery.  I told her I would get that information back.

## 2023-12-16 NOTE — Telephone Encounter (Signed)
 Reviewed options with patient.  Order sent for possible midurethral sling, cystoscopy, and anterior repair.  Patient reports concerns with timing to return to work and desires to review options and consider repeating urethral bulking injection.  Encouraged pt to review options and notify office regarding her decision.

## 2023-12-17 ENCOUNTER — Ambulatory Visit: Payer: Medicaid Other | Admitting: Obstetrics

## 2024-05-19 ENCOUNTER — Ambulatory Visit
Admission: RE | Admit: 2024-05-19 | Discharge: 2024-05-19 | Disposition: A | Discharge status: Home / Self Care | Source: Ambulatory Visit | Attending: Family Medicine | Admitting: Family Medicine

## 2024-05-19 ENCOUNTER — Telehealth

## 2024-05-19 ENCOUNTER — Ambulatory Visit: Payer: Self-pay

## 2024-05-19 VITALS — BP 114/69 | HR 74 | Temp 99.2°F | Resp 18

## 2024-05-19 DIAGNOSIS — H65191 Other acute nonsuppurative otitis media, right ear: Secondary | ICD-10-CM | POA: Diagnosis not present

## 2024-05-19 MED ORDER — CEFDINIR 300 MG PO CAPS: 300.0000 mg | ORAL_CAPSULE | Freq: Two times a day (BID) | ORAL | 0 refills | Status: AC

## 2024-05-19 NOTE — ED Triage Notes (Signed)
 Patient c/o continued right ear pain x 1 week.  Patient was seen at another [facility] and given ear drops and an oral antibiotic, no improvement.  Denies any cold sx's.  Denies any OTC pain meds.

## 2024-05-19 NOTE — ED Provider Notes (Signed)
 [facility]  Note:  This document was prepared using Conservation officer, historic buildings and may include unintentional dictation errors.  MRN: [id number] DOB: 02-25-1986  Subjective:   Kristen Watkins is a 38 y.o. female presenting for a recheck on persistent right ear pain. Has been using Cortisporin HC and amoxicillin  as prescribed by different urgent care clinic.  Has not helped.  No fever, ear drainage, history of ear infections or ear problems, sinus symptoms.   Current Facility-Administered Medications:    ciprofloxacin  (CIPRO ) tablet 500 mg, 500 mg, Oral, Once, Guadlupe Dull T, MD  Current Outpatient Medications:    Etonogestrel  (NEXPLANON  [employer]), Inject into the skin., Disp: , Rfl:    acetaminophen  (TYLENOL ) 500 MG tablet, Take 2 tablets (1,000 mg total) by mouth every 6 (six) hours., Disp: 30 tablet, Rfl: 0   albuterol  (VENTOLIN  HFA) 108 (90 Base) MCG/ACT inhaler, Inhale 2 puffs into the lungs every 6 (six) hours as needed for wheezing or shortness of breath., Disp: 8 g, Rfl: 0   BIOTIN PO, Take by mouth., Disp: , Rfl:    ibuprofen  (ADVIL ) 600 MG tablet, Take 1 tablet (600 mg total) by mouth every 6 (six) hours., Disp: 30 tablet, Rfl: 0   Vitamin D , Ergocalciferol , 50000 units CAPS, Take 1 capsule by mouth once a week., Disp: 12 capsule, Rfl: 1   No Known Allergies  Past Medical History:  Diagnosis Date   Breast lump in female 06/28/2014   Biopsy done at radiology-->benign lymph node Unchanged 01-25-18   UTI (lower urinary tract infection) 03/01/2014   Serratia and Enterococcus      Past Surgical History:  Procedure Laterality Date   CESAREAN SECTION N/A 05/11/2022   Procedure: CESAREAN SECTION;  Surgeon: Lola Donnice HERO, MD;  Location: [facility] OR[facility];  Service: Obstetrics;  Laterality: N/A;    Family History  Problem Relation Age of Onset   Alcohol abuse Neg Hx    Arthritis Neg Hx    Asthma Neg Hx    Birth defects Neg Hx    Cancer Neg Hx    COPD Neg Hx     Depression Neg Hx    Diabetes Neg Hx    Drug abuse Neg Hx    Early death Neg Hx    Heart disease Neg Hx    Hearing loss Neg Hx    Hyperlipidemia Neg Hx    Hypertension Neg Hx    Kidney disease Neg Hx    Learning disabilities Neg Hx    Mental illness Neg Hx    Mental retardation Neg Hx    Miscarriages / Stillbirths Neg Hx    Stroke Neg Hx    Vision loss Neg Hx    Varicose Veins Neg Hx     Social History   Tobacco Use   Smoking status: Never   Smokeless tobacco: Never  Vaping Use   Vaping status: Never Used  Substance Use Topics   Alcohol use: No   Drug use: No    ROS   Objective:   Vitals: BP 114/69 (BP Location: Right Arm)   Pulse 74   Temp 99.2 F (37.3 C) (Oral)   Resp 18   SpO2 98%   Breastfeeding No   Physical Exam Constitutional:      General: She is not in acute distress.    Appearance: Normal appearance. She is well-developed. She is not ill-appearing, toxic-appearing or diaphoretic.  HENT:     Head: Normocephalic and atraumatic.  Right Ear: Ear canal and external ear normal. No tenderness. A middle ear effusion is present. There is no impacted cerumen. Tympanic membrane is injected, erythematous and bulging. Tympanic membrane is not perforated.     Left Ear: Tympanic membrane, ear canal and external ear normal. No tenderness.  No middle ear effusion. There is no impacted cerumen. Tympanic membrane is not injected, perforated, erythematous or bulging.     Nose: Nose normal.     Mouth/Throat:     Mouth: Mucous membranes are moist.  Eyes:     General: No scleral icterus.       Right eye: No discharge.        Left eye: No discharge.     Extraocular Movements: Extraocular movements intact.  Cardiovascular:     Rate and Rhythm: Normal rate.  Pulmonary:     Effort: Pulmonary effort is normal.  Skin:    General: Skin is warm and dry.  Neurological:     General: No focal deficit present.     Mental Status: She is alert and oriented to person,  place, and time.  Psychiatric:        Mood and Affect: Mood normal.        Behavior: Behavior normal.     Assessment and Plan :   PDMP not reviewed this encounter.  1. Other non-recurrent acute nonsuppurative otitis media of right ear    Recommended holding amoxicillin  and switching to cefdinir .  Request an urgent follow-up appointment with an ENT specialist. Counseled patient on potential for adverse effects with medications prescribed/recommended today, ER and return-to-clinic precautions discussed, patient verbalized understanding.    Christopher Savannah, [place] 05/19/24 1429

## 2024-05-19 NOTE — Telephone Encounter (Signed)
 FYI Only or Action Required?: FYI only for provider.  Patient was last seen in primary care on 08/09/2023 by Vicci Barnie NOVAK, MD.  Called Nurse Triage reporting Ear Pain.  Symptoms began a week ago.  Interventions attempted: Prescription medications: amoxicillin  & ear drops.  Symptoms are: gradually worsening.  Triage Disposition: See HCP Within 4 Hours (Or PCP Triage)  Patient/caregiver understands and will follow disposition?: Yes        Copied from CRM [id number]. Topic: Clinical - Red Word Triage >> May 19, 2024  7:45 AM Donna BRAVO wrote: Red Word that prompted transfer to Nurse Triage: patient was seen at Urgent care 05/16/24 for right ear pain, was given 2 kinds of antibiotics  drops and pill,the ear pain is getting worse. Reason for Disposition  [1] SEVERE pain (e.g., excruciating) and [2] not improved 2 hours after pain medicine (e.g., acetaminophen  or ibuprofen )  Answer Assessment - Initial Assessment Questions 1. ANTIBIOTIC: What antibiotic are you taking? How many times per day?     Amoxicillin  500 mg PO q 8 hrs - started Tuesday PM 2. ONSET: When was the antibiotic started?     See alternate assessment 3. LOCATION: Which ear is involved?     See alternate assessment 4. PAIN: How bad is the pain?   (Scale 0-10; none, mild, moderate or severe)     See alternate assessment 5. FEVER: Do you have a fever? If Yes, ask: What is your temperature, how was it measured, and when did it start?     See alternate assessment 6. DISCHARGE: Is there any discharge? If Yes, ask: What color is it? (e.g., clear, white; yellow, green; bloody)     denies 7. OTHER SYMPTOMS: Do you have any other symptoms? (e.g., headache, stiff neck, dizziness, vomiting, runny nose)     N/a 8. PREGNANCY: Is there any chance you are pregnant? When was your last menstrual period?     N/a    [facility] attempted to schedule with PCP, but no access. Scheduled with [facility]  instead.  Answer Assessment - Initial Assessment Questions 1. LOCATION: Which ear is involved?     R ear 2. ONSET: When did the ear pain start?      Last Monday evening 3. SEVERITY: How bad is the pain?  (Scale 1-10; mild, moderate or severe)     10/10 - affects whole R side of face 4. URI SYMPTOMS: Do you have a runny nose or cough?     denies 5. FEVER: Do you have a fever? If Yes, ask: What is your temperature, how was it measured, and when did it start?     denies 6. CAUSE: Have you been swimming recently?, How often do you use Q-TIPS?, Have you had any recent air travel or scuba diving?     Recently went to UC and dx of OM - given 2 rx - drops and PO 7. OTHER SYMPTOMS: Do you have any other symptoms? (e.g., decreased hearing, dizziness, headache, stiff neck, vomiting)     It feels heavy to hear 8. PREGNANCY: Is there any chance you are pregnant? When was your last menstrual period?     N/a  Protocols used: Earache-A-AH, Ear - Otitis Media Follow-up Call-A-AH

## 2024-10-22 ENCOUNTER — Encounter: Payer: Self-pay | Admitting: Family Medicine

## 2024-10-24 ENCOUNTER — Ambulatory Visit: Payer: Self-pay

## 2024-10-24 NOTE — Telephone Encounter (Signed)
 This encounter was created in error - please disregard.

## 2024-10-24 NOTE — Telephone Encounter (Signed)
 Appt made for 2/10

## 2024-10-24 NOTE — Telephone Encounter (Signed)
 Noted.

## 2024-10-25 ENCOUNTER — Ambulatory Visit: Payer: Self-pay | Admitting: *Deleted

## 2024-10-25 NOTE — Telephone Encounter (Signed)
 FYI Only or Action Required?: FYI only for provider: appointment scheduled on 11/01/24.  Patient was last seen in primary care on 08/09/2023 by Vicci Barnie NOVAK, MD.  Called Nurse Triage reporting Advice Only.  Symptoms began same see previous triage .  Interventions attempted: Other: na.  Symptoms are: unchanged.  Triage Disposition: See PCP Within 2 Weeks  Patient/caregiver understands and will follow disposition?: Yes   Unable to find appt with PCP until March. CAL Zara assisted with rescheduling patient  to 11/01/24. Patient unable to make 10/31/24 appt.          Copied from CRM [id number]. Topic: Clinical - Red Word Triage >> Oct 24, 2024  1:23 PM Joesph NOVAK wrote: Red Word that prompted transfer to Nurse Triage: : provider wants to see pt - due to her cycle/ fatigue. Cycle on for 25 days.   Please warm transfer Red Word call to Nurse Triage and then click Send Message and Close CRM. >> Oct 25, 2024  1:33 PM Willma R wrote: Patient calling to reschedule, she is unable to make it on 02/10 >> Oct 24, 2024  1:24 PM Joesph B wrote: Reason for Triage: provider wants to see pt - due to her cycle/ fatigue. Cycle on for 25 days. Reason for Disposition  Requesting regular office appointment  Answer Assessment - Initial Assessment Questions Unable to find appt with PCP until March . Called CAL [facility]ara to assist with rescheduling patient.       1. REASON FOR CALL: What is the main reason for your call? or How can I best help you?     Patient calling to change appt time.  2. SYMPTOMS : Do you have any symptoms?      No new sx of vaginal bleeding x 25 days.  3. OTHER QUESTIONS: Do you have any other questions?     no  Protocols used: Information Only Call - No Triage-A-AH

## 2024-10-25 NOTE — Telephone Encounter (Signed)
 Noted.

## 2024-10-31 ENCOUNTER — Ambulatory Visit: Payer: Self-pay | Admitting: Family Medicine

## 2024-11-01 ENCOUNTER — Ambulatory Visit: Admitting: Family Medicine
